# Patient Record
Sex: Male | Born: 1960 | Race: Black or African American | Hispanic: No | Marital: Single | Smoking: Never smoker
Health system: Southern US, Community
[De-identification: ages and names within clinical notes are randomized; demographics above are authoritative.]

## PROBLEM LIST (undated history)

## (undated) DIAGNOSIS — M810 Age-related osteoporosis without current pathological fracture: Secondary | ICD-10-CM

## (undated) DIAGNOSIS — N401 Enlarged prostate with lower urinary tract symptoms: Secondary | ICD-10-CM

## (undated) DIAGNOSIS — E669 Obesity, unspecified: Secondary | ICD-10-CM

## (undated) DIAGNOSIS — M5416 Radiculopathy, lumbar region: Secondary | ICD-10-CM

## (undated) DIAGNOSIS — J45909 Unspecified asthma, uncomplicated: Secondary | ICD-10-CM

## (undated) DIAGNOSIS — N138 Other obstructive and reflux uropathy: Secondary | ICD-10-CM

## (undated) DIAGNOSIS — Z8719 Personal history of other diseases of the digestive system: Secondary | ICD-10-CM

## (undated) DIAGNOSIS — T7840XA Allergy, unspecified, initial encounter: Secondary | ICD-10-CM

## (undated) DIAGNOSIS — M1990 Unspecified osteoarthritis, unspecified site: Secondary | ICD-10-CM

## (undated) HISTORY — PX: APPENDECTOMY: SHX54

## (undated) HISTORY — DX: Unspecified osteoarthritis, unspecified site: M19.90

## (undated) HISTORY — DX: Personal history of other diseases of the digestive system: Z87.19

## (undated) HISTORY — DX: Other obstructive and reflux uropathy: N13.8

## (undated) HISTORY — DX: Radiculopathy, lumbar region: M54.16

## (undated) HISTORY — DX: Unspecified asthma, uncomplicated: J45.909

## (undated) HISTORY — DX: Obesity, unspecified: E66.9

## (undated) HISTORY — DX: Allergy, unspecified, initial encounter: T78.40XA

## (undated) HISTORY — DX: Age-related osteoporosis without current pathological fracture: M81.0

## (undated) HISTORY — DX: Benign prostatic hyperplasia with lower urinary tract symptoms: N40.1

---

## 1997-04-13 ENCOUNTER — Emergency Department (HOSPITAL_COMMUNITY): Admission: EM | Admit: 1997-04-13 | Discharge: 1997-04-13 | Payer: Self-pay | Admitting: Emergency Medicine

## 1997-09-15 ENCOUNTER — Emergency Department (HOSPITAL_COMMUNITY): Admission: EM | Admit: 1997-09-15 | Discharge: 1997-09-15 | Payer: Self-pay | Admitting: Emergency Medicine

## 1997-11-29 ENCOUNTER — Encounter: Payer: Self-pay | Admitting: Emergency Medicine

## 1997-11-29 ENCOUNTER — Emergency Department (HOSPITAL_COMMUNITY): Admission: EM | Admit: 1997-11-29 | Discharge: 1997-11-29 | Payer: Self-pay | Admitting: Emergency Medicine

## 1998-05-18 ENCOUNTER — Emergency Department (HOSPITAL_COMMUNITY): Admission: EM | Admit: 1998-05-18 | Discharge: 1998-05-18 | Payer: Self-pay | Admitting: Emergency Medicine

## 1998-08-20 ENCOUNTER — Emergency Department (HOSPITAL_COMMUNITY): Admission: EM | Admit: 1998-08-20 | Discharge: 1998-08-20 | Payer: Self-pay

## 2000-05-03 ENCOUNTER — Emergency Department (HOSPITAL_COMMUNITY): Admission: EM | Admit: 2000-05-03 | Discharge: 2000-05-03 | Payer: Self-pay | Admitting: Internal Medicine

## 2000-08-28 ENCOUNTER — Encounter: Payer: Self-pay | Admitting: Emergency Medicine

## 2000-08-28 ENCOUNTER — Emergency Department (HOSPITAL_COMMUNITY): Admission: AC | Admit: 2000-08-28 | Discharge: 2000-08-29 | Payer: Self-pay

## 2003-07-23 ENCOUNTER — Emergency Department (HOSPITAL_COMMUNITY): Admission: EM | Admit: 2003-07-23 | Discharge: 2003-07-23 | Payer: Self-pay | Admitting: Family Medicine

## 2003-07-24 ENCOUNTER — Inpatient Hospital Stay (HOSPITAL_COMMUNITY): Admission: EM | Admit: 2003-07-24 | Discharge: 2003-07-28 | Payer: Self-pay | Admitting: Emergency Medicine

## 2003-07-24 ENCOUNTER — Encounter (INDEPENDENT_AMBULATORY_CARE_PROVIDER_SITE_OTHER): Payer: Self-pay | Admitting: *Deleted

## 2003-11-29 ENCOUNTER — Emergency Department (HOSPITAL_COMMUNITY): Admission: EM | Admit: 2003-11-29 | Discharge: 2003-11-29 | Payer: Self-pay | Admitting: Family Medicine

## 2004-09-19 ENCOUNTER — Emergency Department (HOSPITAL_COMMUNITY): Admission: EM | Admit: 2004-09-19 | Discharge: 2004-09-19 | Payer: Self-pay | Admitting: Emergency Medicine

## 2004-09-20 ENCOUNTER — Emergency Department (HOSPITAL_COMMUNITY): Admission: EM | Admit: 2004-09-20 | Discharge: 2004-09-20 | Payer: Self-pay | Admitting: Emergency Medicine

## 2005-08-15 ENCOUNTER — Emergency Department (HOSPITAL_COMMUNITY): Admission: EM | Admit: 2005-08-15 | Discharge: 2005-08-15 | Payer: Self-pay | Admitting: Family Medicine

## 2005-08-25 ENCOUNTER — Emergency Department (HOSPITAL_COMMUNITY): Admission: EM | Admit: 2005-08-25 | Discharge: 2005-08-25 | Payer: Self-pay | Admitting: Emergency Medicine

## 2005-08-26 ENCOUNTER — Emergency Department (HOSPITAL_COMMUNITY): Admission: EM | Admit: 2005-08-26 | Discharge: 2005-08-26 | Payer: Self-pay | Admitting: Emergency Medicine

## 2005-09-02 ENCOUNTER — Emergency Department (HOSPITAL_COMMUNITY): Admission: EM | Admit: 2005-09-02 | Discharge: 2005-09-02 | Payer: Self-pay | Admitting: Family Medicine

## 2005-11-03 ENCOUNTER — Emergency Department (HOSPITAL_COMMUNITY): Admission: EM | Admit: 2005-11-03 | Discharge: 2005-11-03 | Payer: Self-pay | Admitting: Family Medicine

## 2005-12-06 ENCOUNTER — Emergency Department (HOSPITAL_COMMUNITY): Admission: EM | Admit: 2005-12-06 | Discharge: 2005-12-06 | Payer: Self-pay | Admitting: Family Medicine

## 2006-01-23 ENCOUNTER — Emergency Department (HOSPITAL_COMMUNITY): Admission: EM | Admit: 2006-01-23 | Discharge: 2006-01-23 | Payer: Self-pay | Admitting: Family Medicine

## 2006-02-16 ENCOUNTER — Emergency Department (HOSPITAL_COMMUNITY): Admission: EM | Admit: 2006-02-16 | Discharge: 2006-02-16 | Payer: Self-pay | Admitting: Emergency Medicine

## 2006-05-12 ENCOUNTER — Emergency Department (HOSPITAL_COMMUNITY): Admission: EM | Admit: 2006-05-12 | Discharge: 2006-05-12 | Payer: Self-pay | Admitting: Emergency Medicine

## 2006-09-16 ENCOUNTER — Emergency Department (HOSPITAL_COMMUNITY): Admission: EM | Admit: 2006-09-16 | Discharge: 2006-09-16 | Payer: Self-pay | Admitting: Emergency Medicine

## 2008-04-29 ENCOUNTER — Emergency Department (HOSPITAL_COMMUNITY): Admission: EM | Admit: 2008-04-29 | Discharge: 2008-04-29 | Payer: Self-pay | Admitting: Emergency Medicine

## 2008-05-07 ENCOUNTER — Ambulatory Visit: Payer: Self-pay | Admitting: Vascular Surgery

## 2008-05-07 ENCOUNTER — Ambulatory Visit (HOSPITAL_COMMUNITY): Admission: RE | Admit: 2008-05-07 | Discharge: 2008-05-07 | Payer: Self-pay | Admitting: Orthopedic Surgery

## 2008-05-07 ENCOUNTER — Encounter (INDEPENDENT_AMBULATORY_CARE_PROVIDER_SITE_OTHER): Payer: Self-pay | Admitting: Orthopedic Surgery

## 2008-05-27 ENCOUNTER — Emergency Department (HOSPITAL_COMMUNITY): Admission: EM | Admit: 2008-05-27 | Discharge: 2008-05-27 | Payer: Self-pay | Admitting: Emergency Medicine

## 2008-07-08 ENCOUNTER — Emergency Department (HOSPITAL_COMMUNITY): Admission: EM | Admit: 2008-07-08 | Discharge: 2008-07-08 | Payer: Self-pay | Admitting: Family Medicine

## 2008-08-10 ENCOUNTER — Emergency Department (HOSPITAL_COMMUNITY): Admission: EM | Admit: 2008-08-10 | Discharge: 2008-08-10 | Payer: Self-pay | Admitting: Emergency Medicine

## 2008-08-26 ENCOUNTER — Emergency Department (HOSPITAL_COMMUNITY): Admission: EM | Admit: 2008-08-26 | Discharge: 2008-08-26 | Payer: Self-pay | Admitting: Emergency Medicine

## 2009-01-31 ENCOUNTER — Emergency Department (HOSPITAL_COMMUNITY): Admission: EM | Admit: 2009-01-31 | Discharge: 2009-01-31 | Payer: Self-pay | Admitting: Emergency Medicine

## 2010-03-25 ENCOUNTER — Emergency Department (HOSPITAL_COMMUNITY)
Admission: EM | Admit: 2010-03-25 | Discharge: 2010-03-26 | Disposition: A | Discharge status: Home / Self Care | Payer: Self-pay | Attending: Emergency Medicine | Admitting: Emergency Medicine

## 2010-03-25 DIAGNOSIS — M25559 Pain in unspecified hip: Secondary | ICD-10-CM | POA: Insufficient documentation

## 2010-03-26 ENCOUNTER — Emergency Department (HOSPITAL_COMMUNITY): Payer: Self-pay

## 2010-04-15 LAB — GC/CHLAMYDIA PROBE AMP, GENITAL: Chlamydia, DNA Probe: NEGATIVE

## 2010-04-18 LAB — RPR: RPR Ser Ql: NONREACTIVE

## 2010-04-18 LAB — GC/CHLAMYDIA PROBE AMP, GENITAL
Chlamydia, DNA Probe: NEGATIVE
GC Probe Amp, Genital: POSITIVE — AB

## 2010-05-26 NOTE — Discharge Summary (Signed)
NAME:  [name] Pope, [name] Pope NO.:  [id number]   MEDICAL RECORD NO.:  [id number]                   PATIENT TYPE:  INP   LOCATION:  5713                                 FACILITY:  [facility]   PHYSICIAN:  Velora Heckler, M.D.                DATE OF BIRTH:  [birth year]   DATE OF ADMISSION:  07/24/2003  DATE OF DISCHARGE:  07/28/2003                                 DISCHARGE SUMMARY   REASON FOR ADMISSION:  Acute appendicitis with perforation.   BRIEF HISTORY:  The patient is a 50 year old black male who presented to the  emergency department with a 5-day history of abdominal pain localized to the  right lower quadrant.  The patient was seen by Dr. Sheppard Penton. [name].  White  count was 9.3, but with a decided left shift.  CT scan of abdomen and pelvis  showed findings consistent with acute retrocecal appendicitis.   HOSPITAL COURSE:  The patient was seen and evaluated in the emergency  department.  He was prepared and taken to the operating room, where he  underwent laparoscopic appendectomy.  Findings at surgery included acute  appendicitis with perforation.  Postoperatively, he received intravenous  antibiotics.  He was started on a clear liquid diet.  He had gradual  resolution of his ileus.  He was advanced to a regular diet.  Ileus resolved  by the fourth postoperative day and he was prepared for discharge.   DISCHARGE PLANNING:  The patient is discharged home on the fourth  postoperative day.  He is tolerating a regular diet and ambulating  independently.  The patient will be seen back at my office at [facility] in 2 weeks.   DISCHARGE MEDICATIONS:  Discharge medications include:  1. Vicodin as needed for pain.  2. Augmentin 875 mg b.i.d. for 1 week.   FINAL DIAGNOSIS:  Acute appendicitis with perforation.   CONDITION AT DISCHARGE:  Condition at discharge is improved.                                                Velora Heckler, M.D.    TMG/MEDQ  D:  08/31/2003  T:  08/31/2003  Job:  [id number]

## 2010-05-26 NOTE — H&P (Signed)
NAME:  [name] Pope, [name] Pope NO.:  [id number]   MEDICAL RECORD NO.:  [id number]                   PATIENT TYPE:  INP   LOCATION:  5713                                 FACILITY:  [facility]   PHYSICIAN:  Velora Heckler, M.D.                DATE OF BIRTH:  02-12-1960   DATE OF ADMISSION:  07/24/2003  DATE OF DISCHARGE:                                HISTORY & PHYSICAL   REFERRING PHYSICIAN:  Dr. Hilario Quarry, Emergency Department.   CHIEF COMPLAINT:  Abdominal pain.   HISTORY OF PRESENT ILLNESS:  The patient is a 50 year old black male, who  presents to the emergency department with 5-day history of abdominal pain  localizing to the right lower quadrant.  He denies fevers or chills.  He  denies nausea or vomiting.  He has noted irregular bowel movements and loss  of appetite.  The patient was seen by Dr. Mariel Aloe.  Laboratory studies  showed a white blood cell count of 9.3.  However, differential showed 83%  neutrophils.  Urinalysis was benign.  The patient underwent CT scan of the  abdomen and pelvis, which demonstrated the findings consistent with acute  retrocecal appendicitis.  General Surgery is consulted.   PAST MEDICAL HISTORY:  No prior surgery.  The patient denies significant  medical illness.   MEDICATIONS:  None.   ALLERGIES:  None known.   SOCIAL HISTORY:  The patient is accompanied by a girlfriend.  He works as an  Advice worker.  He does not smoke.  He drinks alcohol  socially.  He denies illicit drug use.   FAMILY HISTORY:  Notable for appendectomy and cholecystectomy in the  patient's mother.   REVIEW OF SYSTEMS:  15-system review without significant other positives  except as noted above.   PHYSICAL EXAMINATION:  GENERAL: A 50 year old, ill-appearing, black male on  a stretcher in the emergency department.  VITAL SIGNS:  Temp 99.1, pulse 71, respirations 20, blood pressure 114/64.  HEENT:  Normocephalic,  atraumatic.  Sclerae are clear.  Conjunctivae are  clear.  Pupils are equal and reactive.  Dentition is good.  Mucous membranes  are dry.  NECK:  Supple without mass.  Thyroid is normal without nodularity.  There is  no palpable lymphadenopathy.  LUNGS:  Clear to auscultation bilaterally without rales, rhonchi, or  wheezes.  CARDIAC:  Regular rate and rhythm without significant murmur.  ABDOMEN:  Soft, mildly to moderately distended, without bowel sounds present  on auscultation.  There is mild diffuse tenderness.  Tenderness is maximal  at McBurney's point with rebound tenderness.  There is voluntary guarding.  There is no palpable mass.  GENITOURINARY:  Normal male.  EXTREMITIES:  Nontender without edema.  NEUROLOGIC:  The patient is alert and oriented without focal deficit.   LABORATORY STUDIES:  White count 9.3, hemoglobin 14.0, platelet count  241,000, differential with 83% neutrophils, 10%  lymphocytes.  Chemistry  profile shows normal electrolytes.  Renal function is normal.  Urinalysis is  benign.   RADIOGRAPHIC STUDIES:  CT scan, abdomen and pelvis, with acute retrocecal  appendicitis reviewed in the emergency department.   IMPRESSION:  Acute appendicitis.   PLAN:  1. Admission to [facility].  2. Initiation of intravenous antibiotics.  3. To operating room for appendectomy.  4. Routine postoperative care.   I discussed with the patient and his girlfriend the indications for surgery.  I explained laparoscopic technique versus open surgery.  Given the  retrocecal location of his appendix, he may not be amenable to laparoscopic  technique, although I think that is worthwhile as an initial intervention.  We will proceed to the operating room immediately.  Hospital course was  discussed.  They understand and wish to proceed.                                                Velora Heckler, M.D.    TMG/MEDQ  D:  07/24/2003  T:  07/24/2003  Job:  [id number]   cc:    Hilario Quarry, M.D.  [address]  [place]  [place] [address]  Fax: [phone]

## 2010-05-26 NOTE — Op Note (Signed)
NAME:  [name] Pope, [name] Pope NO.:  [id number]   MEDICAL RECORD NO.:  [id number]                   PATIENT TYPE:  INP   LOCATION:  5713                                 FACILITY:  [facility]   PHYSICIAN:  Velora Heckler, M.D.                DATE OF BIRTH:  June 22, 1960   DATE OF PROCEDURE:  07/24/2003  DATE OF DISCHARGE:                                 OPERATIVE REPORT   PREOPERATIVE DIAGNOSIS:  Acute retrocecal appendicitis.   POSTOPERATIVE DIAGNOSIS:  Retrocecal acute appendicitis with contained  perforation.   PROCEDURE:  Laparoscopic appendectomy.   SURGEON:  Velora Heckler, M.D.   ASSISTANT:  None.   ANESTHESIA:  General per Dr. Kipp Brood.   ESTIMATED BLOOD LOSS:  Minimal.   PREPARATION:  Betadine.   COMPLICATIONS:  None.   INDICATIONS:  The patient is a 50 year old black male who presents with a 5  day history of abdominal pain localizing to the right abdomen.  The patient  notes loss of appetite.  He has had irregular bowel movements.  He was seen  in the emergency department, found to have a slight left shift of his white  blood cell count.  He underwent CT scan of the abdomen which demonstrated  retrocecal acute appendicitis.   Patient was then prepared and brought to the operating room for  appendectomy.   Procedure is done in OR #16 at the [facility].  The  patient is brought to the operating room, placed in the supine position on  the operating room table.  Following administration of general anesthesia,  the patient is prepped and draped in the usual strict aseptic fashion.  After ascertaining that an adequate level of anesthesia had been obtained, a  supraumbilical incision was made in the midline.  Dissection was carried to  the fascia.  The fascia was incised and the peritoneal cavity was entered  cautiously.  A 0 Vicryl pursestring suture was placed in the fascia.  An  Hasson cannula was introduced and secured  with the pursestring suture.  The  abdomen was insufflated with carbon dioxide.  The laparoscope was introduced  and the abdomen was explored.  Operative port is placed in the right upper  quadrant.  Glassman clamp was used to mobilize the cecum.  Appendix appears  relatively normal at its base.  However, it does tract up the right colic  gutter in a retrocecal location.  It is obviously injected and dilated and  indurated.  There is no gross peritonitis.  A second operative port is  placed in the left lower quadrant.  Base of the appendix is carefully  dissected out.  A window is made in the mesoappendix.  A GIA type stapler is  inserted and used to transect the base of the appendix.  Appendiceal  mesentery is then taken down with the harmonic scalpel.  Due to difficulty  mobilizing the cecum and right colon out of the field of view, a fourth  operative port is placed in the left upper quadrant.  A Glassman clamp is  used through this port to retract the cecum and right colon medially.  This  provides adequate visualization to the retrocecal portion of the appendix.  Retroperitoneum is incised with the harmonic scalpel.  Remaining  mesoappendix is divided with the harmonic scalpel.  A small abscess cavity  at the tip of the appendix, in the right colic gutter, is entered and  evacuated.  Remainder of the peritoneal attachments are then incised with  the harmonic scalpel and the appendix is completely resected and placed in  through an endo catch bag.  It is withdrawn through the left lower quadrant  port.  Colic gutter and pericecal area is then irrigated copiously with warm  saline which was evacuated.  Good hemostasis is noted.  All fluid is  evacuated from the peritoneal cavity.  Ports are removed under direct vision  and pneumoperitoneum released.  A 0 Vicryl pursestring suture is tied  securely.  Wounds are anesthetized with local anesthetic.  All wounds were  closed with interrupted  4-0 Vicryl subcuticular sutures.  Wounds are washed  and dried and benzoin and Steri-Strips are applied.  Sterile dressings are  applied.  Appendix is submitted to pathology for review.  The patient  tolerated the procedure well.                                               Velora Heckler, M.D.    TMG/MEDQ  D:  07/24/2003  T:  07/25/2003  Job:  [id number]

## 2010-08-03 IMAGING — CR DG KNEE 1-2V*L*
2 series · 2 of 2 positions shown · non-contrast
Comparison: 09/19/2004.

CLINICAL DATA: Leg pain.

LEFT KNEE - 1-2 VIEW

[view not recorded (1 of 2)]
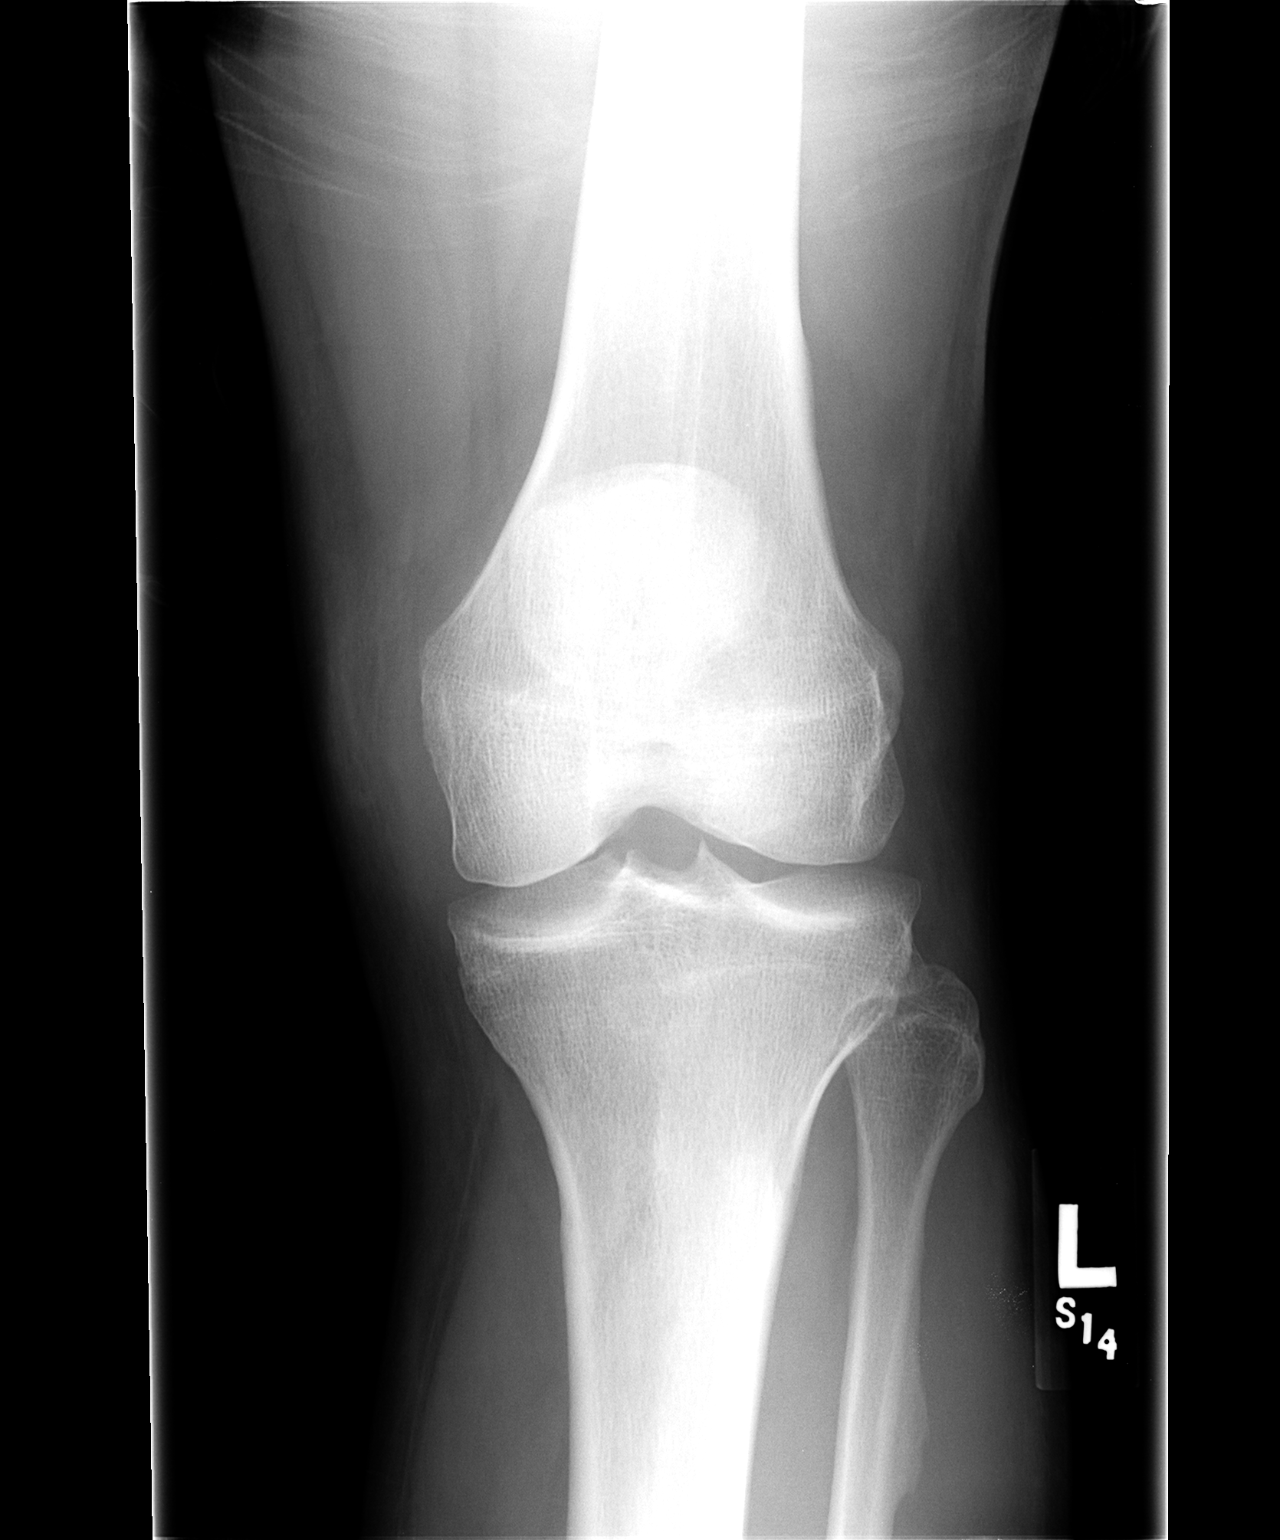

[view not recorded (2 of 2)]
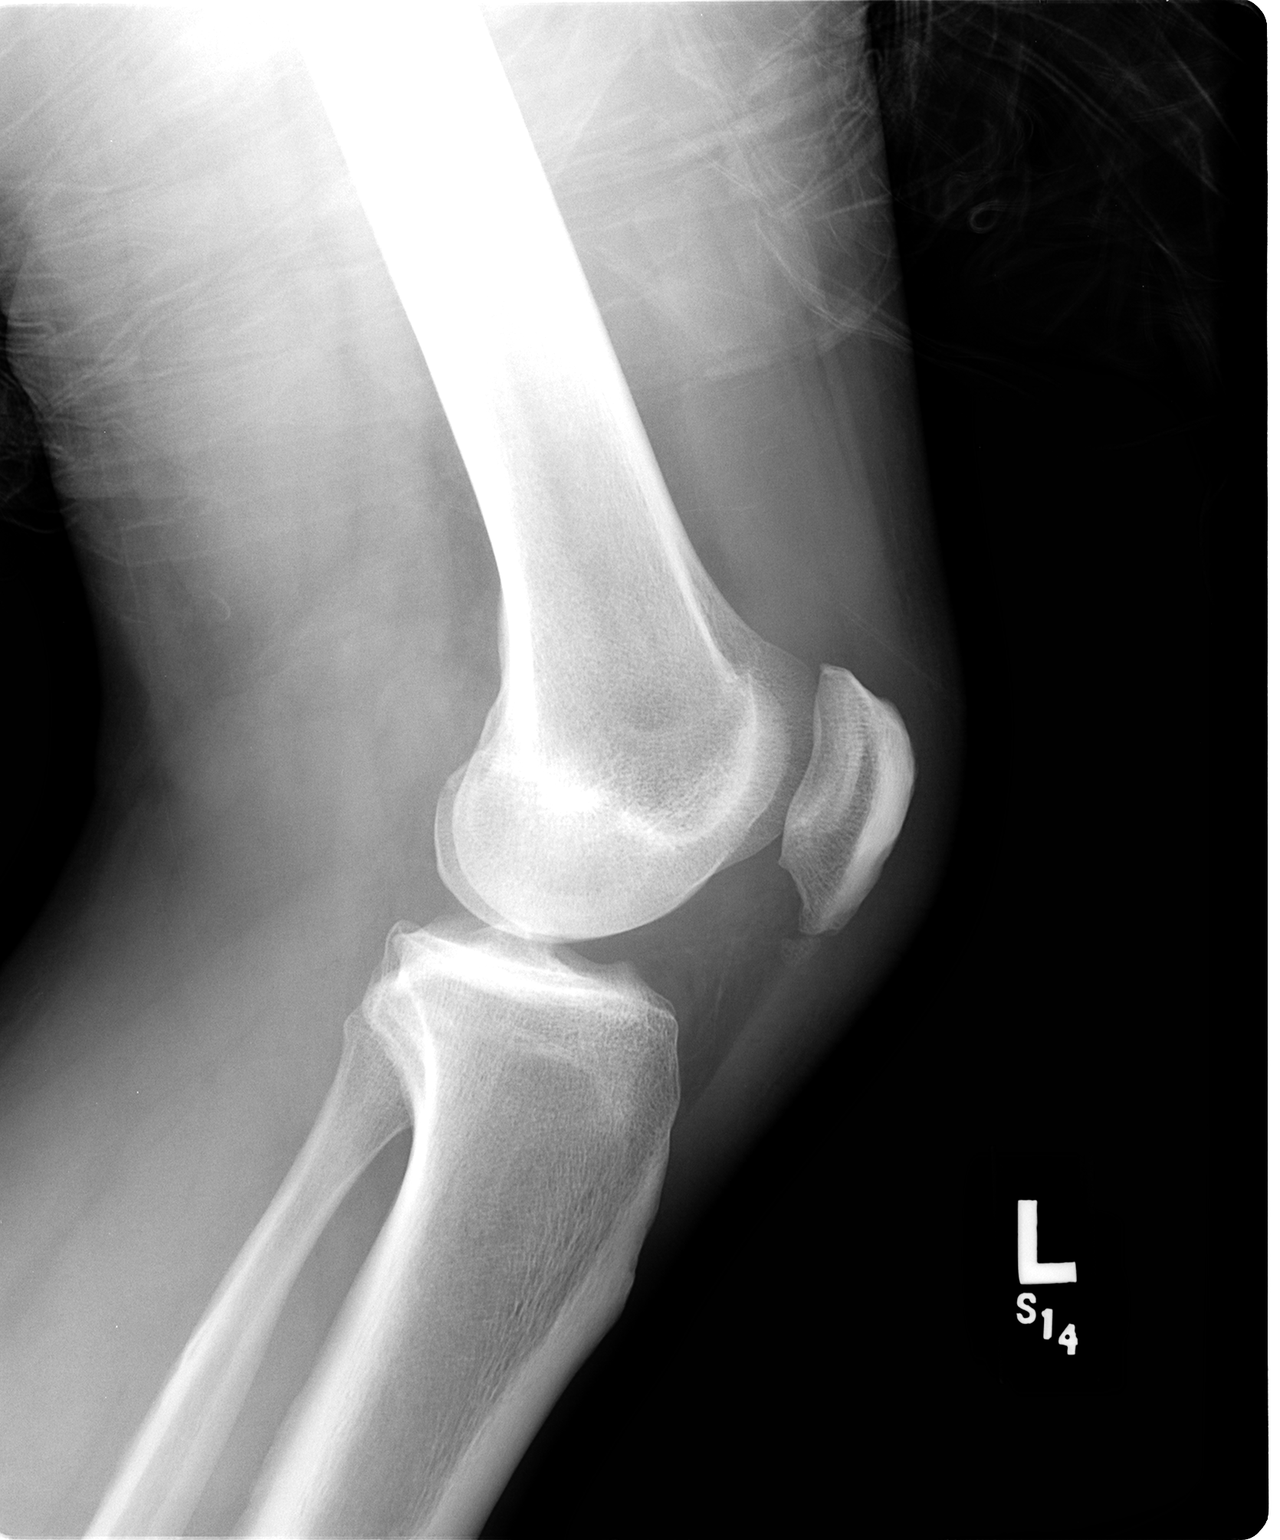

[2 of 2 positions shown; findings below may reference images not displayed]

FINDINGS: There is a large joint effusion as noted on the prior
study.  No fracture is identified.  Joint spaces are well
maintained. Minimal spurring of the patella and tibial spines is
noted.
IMPRESSION: Knee joint effusion.  No acute bony abnormality.

## 2011-03-15 ENCOUNTER — Emergency Department (INDEPENDENT_AMBULATORY_CARE_PROVIDER_SITE_OTHER): Payer: Self-pay

## 2011-03-15 ENCOUNTER — Encounter (HOSPITAL_COMMUNITY): Payer: Self-pay | Admitting: Emergency Medicine

## 2011-03-15 ENCOUNTER — Emergency Department (INDEPENDENT_AMBULATORY_CARE_PROVIDER_SITE_OTHER)
Admission: EM | Admit: 2011-03-15 | Discharge: 2011-03-15 | Disposition: A | Discharge status: Home Health | Payer: Self-pay | Source: Home / Self Care | Attending: Family Medicine | Admitting: Family Medicine

## 2011-03-15 DIAGNOSIS — S329XXA Fracture of unspecified parts of lumbosacral spine and pelvis, initial encounter for closed fracture: Secondary | ICD-10-CM

## 2011-03-15 MED ORDER — ACETAMINOPHEN-CODEINE #3 300-30 MG PO TABS: 1.0000 | ORAL_TABLET | Freq: Four times a day (QID) | ORAL | Status: AC | PRN

## 2011-03-15 NOTE — ED Notes (Addendum)
PT HERE WITH BILAT SEVERE HIP PAIN THAT HAS STARTED X AGO BUT HAS WORSENED WITH MOBILITY AND LIFESTYLE.PT STATES THE PAIN IS SHARP,CONSTANT PAIN THAT AFFECTS HIM GETTING IN/OUT OOB .NEEDS WIFE TO LIFT HIM IN BED.DENIES INJURY OR STRAIN.NO SWELLING.PT WAS SEEN HERE 1 YR AGO WITH LEFT LEG PAIN AND DIAGNOSED WITH ARTHRITIS AND TOLD TO F/U BUT PT DIDN'T.PT ALSO REPORTS OF WALKING UP [place] AND BOTH HIPS GAVE OUT ON HIM.TAKING OTC IBUPROFEN FOR PAIN

## 2011-03-15 NOTE — Discharge Instructions (Signed)
Call ortho for appt. Use crutches as directe.

## 2011-03-15 NOTE — ED Provider Notes (Signed)
History     CSN: [id number]  Arrival date & time 03/15/11  1122   First MD Initiated Contact with Patient 03/15/11 1335      Chief Complaint  Patient presents with  . Hip Pain    (Consider location/radiation/quality/duration/timing/severity/associated sxs/prior treatment) HPI Comments: The patient reports having bilat hip pain for the past 6 months. Has been told he has arthritis in past but did no follow up with a physician. States he cannot get in and out of bed without assistance. States he is unable to drive. No swelling. No injury.taking ibuprofen with very little relief.   The history is provided by the patient.    History reviewed. No pertinent past medical history.  Past Surgical History  Procedure Date  . Appendectomy     History reviewed. No pertinent family history.  History  Substance Use Topics  . Smoking status: Never Smoker   . Smokeless tobacco: Not on file  . Alcohol Use: No      Review of Systems  HENT: Negative.   Respiratory: Negative.   Cardiovascular: Negative.   Gastrointestinal: Negative.   Genitourinary: Negative.   Musculoskeletal: Positive for myalgias, arthralgias and gait problem.  Neurological: Positive for weakness. Negative for numbness.    Allergies  Review of patient's allergies indicates no known allergies.  Home Medications   Current Outpatient Rx  Name Route Sig Dispense Refill  . ACETAMINOPHEN-CODEINE #3 300-30 MG PO TABS Oral Take 1-2 tablets by mouth every 6 (six) hours as needed for pain. 20 tablet 0    BP 119/77  Pulse 70  Temp(Src) 98.6 F (37 C) (Oral)  Resp 18  SpO2 97%  Physical Exam  Nursing note and vitals reviewed. Constitutional: He is oriented to person, place, and time. He appears well-developed.  HENT:  Head: Normocephalic.  Cardiovascular: Regular rhythm.   Pulmonary/Chest: Effort normal and breath sounds normal.  Musculoskeletal:       Painful rom of hips. No apparent swelling    Neurological: He is alert and oriented to person, place, and time.  Skin: Skin is warm and dry.  he is ambulatory  ED Course  Procedures (including critical care time)  Labs Reviewed - No data to display Dg Hip Complete Right  03/15/2011  *RADIOLOGY REPORT*  Clinical Data: Hip pain for 2 months  RIGHT HIP - COMPLETE 2+ VIEW  Comparison: None.  Findings: There is a subacute fracture involving the right pubic body with evidence of callus formation.  Femoral neck is intact. Mild degenerative change of the SI joints.  IMPRESSION: No evidence of acute bony injury of the femoral neck.  Subacute fracture of the right pubic body with callus formation is noted.  Original Report Authenticated By: Donavan Burnet, M.D.     1. Fractured pelvis       MDM          Randa Spike, MD 03/15/11 [id number]

## 2011-04-25 ENCOUNTER — Emergency Department (HOSPITAL_COMMUNITY): Payer: Self-pay

## 2011-04-25 ENCOUNTER — Encounter (HOSPITAL_COMMUNITY): Payer: Self-pay | Admitting: Emergency Medicine

## 2011-04-25 ENCOUNTER — Emergency Department (HOSPITAL_COMMUNITY)
Admission: EM | Admit: 2011-04-25 | Discharge: 2011-04-25 | Disposition: A | Discharge status: Home / Self Care | Payer: Self-pay | Attending: Emergency Medicine | Admitting: Emergency Medicine

## 2011-04-25 DIAGNOSIS — M25559 Pain in unspecified hip: Secondary | ICD-10-CM | POA: Insufficient documentation

## 2011-04-25 DIAGNOSIS — M25551 Pain in right hip: Secondary | ICD-10-CM

## 2011-04-25 MED ORDER — MELOXICAM 7.5 MG PO TABS: 7.5000 mg | ORAL_TABLET | Freq: Every day | ORAL | Status: DC

## 2011-04-25 NOTE — ED Notes (Addendum)
Pt's family/friend at bedside reports pt being seen at [facility] x 3 weeks ago for pelvic pain-+pelvic fx.  Here today for pain.  Ran out of pain med.  Pt is ambulatory with crutches.  Denies any injury associated to the fx

## 2011-04-25 NOTE — ED Notes (Signed)
Pt c/o pelvic pain and was treated at [facility] urgent [facility] x3 weeks ago.  Pain unrelieved with tylenol.

## 2011-04-25 NOTE — Discharge Instructions (Signed)
Your x-ray showed that the fracture to her pelvis appears to be healing. You have been given a prescription for Mobic, which is a medication in the Motrin family, which can work well for arthritis type pain. Please take this, with food, as prescribed. Return to the ER if you develop numbness, weakness, inability to walk, bleeding, or any other worrisome symptoms.  Hip Pain The hips join the upper legs to the lower pelvis. The bones, cartilage, tendons, and muscles of the hip joint perform a lot of work each day holding your body weight and allowing you to move around. Hip pain is a common symptom. It can range from a minor ache to severe pain on 1 or both hips. Pain may be felt on the inside of the hip joint near the groin, or the outside near the buttocks and upper thigh. There may be swelling or stiffness as well. It occurs more often when a person walks or performs activity. There are many reasons hip pain can develop. CAUSES  It is important to work with your caregiver to identify the cause since many conditions can impact the bones, cartilage, muscles, and tendons of the hips. Causes for hip pain include:  Broken (fractured) bones.   Separation of the thighbone from the hip socket (dislocation).   Torn cartilage of the hip joint.   Swelling (inflammation) of a tendon (tendonitis), the sac within the hip joint (bursitis), or a joint.   A weakening in the abdominal wall (hernia), affecting the nerves to the hip.   Arthritis in the hip joint or lining of the hip joint.   Pinched nerves in the back, hip, or upper thigh.   A bulging disc in the spine (herniated disc).   Rarely, bone infection or cancer.  DIAGNOSIS  The location of your hip pain will help your caregiver understand what may be causing the pain. A diagnosis is based on your medical history, your symptoms, results from your physical exam, and results from diagnostic tests. Diagnostic tests may include X-ray exams, a  computerized magnetic scan (magnetic resonance imaging, MRI), or bone scan. TREATMENT  Treatment will depend on the cause of your hip pain. Treatment may include:  Limiting activities and resting until symptoms improve.   Crutches or other walking supports (a cane or brace).   Ice, elevation, and compression.   Physical therapy or home exercises.   Shoe inserts or special shoes.   Losing weight.   Medications to reduce pain.   Undergoing surgery.  HOME CARE INSTRUCTIONS   Only take over-the-counter or prescription medicines for pain, discomfort, or fever as directed by your caregiver.   Put ice on the injured area:   Put ice in a plastic bag.   Place a towel between your skin and the bag.   Leave the ice on for 15 to 20 minutes at a time, 3 to 4 times a day.   Keep your leg raised (elevated) when possible to lessen swelling.   Avoid activities that cause pain.   Follow specific exercises as directed by your caregiver.   Sleep with a pillow between your legs on your most comfortable side.   Record how often you have hip pain, the location of the pain, and what it feels like. This information may be helpful to you and your caregiver.   Ask your caregiver about returning to work or sports and whether you should drive.   Follow up with your caregiver for further exams, therapy, or testing as  directed.  SEEK MEDICAL CARE IF:   Your pain or swelling continues or worsens after 1 week.   You are feeling unwell or have chills.   You have increasing difficulty with walking.   You have a loss of sensation or other new symptoms.   You have questions or concerns.  SEEK IMMEDIATE MEDICAL CARE IF:   You cannot put weight on the affected hip.   You have fallen.   You have a sudden increase in pain and swelling in your hip.   You have a fever.  MAKE SURE YOU:   Understand these instructions.   Will watch your condition.   Will get help right away if you are not  doing well or get worse.  Document Released: 06/14/2009 Document Revised: 12/14/2010 Document Reviewed: 06/14/2009 [facility] Patient Information 2012 [place], [place].

## 2011-04-25 NOTE — ED Provider Notes (Signed)
History     CSN: [id number]  Arrival date & time 04/25/11  1330   First MD Initiated Contact with Patient 04/25/11 1508      Chief Complaint  Patient presents with  . Pelvic Pain    (Consider location/radiation/quality/duration/timing/severity/associated sxs/prior treatment) HPI History from patient. 51 year old male with no pertinent past medical history presents with bilateral hip pain. He was seen for the same 3 weeks ago at [facility] urgent care, where x-rays were performed. He was found to have a healing fracture of his pelvis. He was given prescriptions for Tylenol 3 and instructed to follow up with orthopedics, but states he was unable to do so as he has no insurance. He is not exactly sure when the pain started, but thinks it may have been at least several weeks prior to going to urgent care. It's been persistent since his urgent care visit. Described as throbbing pain to the bilateral hips, worse with any movement. States it is difficult to sleep. He is able to bear weight and walk, although states this is painful. States that the Tylenol 3 has not helped very much.  History reviewed. No pertinent past medical history.  Past Surgical History  Procedure Date  . Appendectomy     History reviewed. No pertinent family history.  History  Substance Use Topics  . Smoking status: Never Smoker   . Smokeless tobacco: Not on file  . Alcohol Use: No      Review of Systems  Constitutional: Negative.  Negative for fever, chills and unexpected weight change.  Gastrointestinal: Negative for nausea, vomiting and abdominal pain.  Musculoskeletal: Positive for arthralgias and gait problem. Negative for myalgias, back pain and joint swelling.  Skin: Negative for color change and rash.  Neurological: Negative for weakness and numbness.    Allergies  Review of patient's allergies indicates no known allergies.  Home Medications  No current outpatient prescriptions on file.  BP  141/90  Pulse 72  Temp(Src) 97.7 F (36.5 C) (Oral)  Resp 20  SpO2 99%  Physical Exam  Nursing note and vitals reviewed. Constitutional: He appears well-developed and well-nourished. No distress.  HENT:  Head: Normocephalic and atraumatic.  Neck: Normal range of motion.  Cardiovascular: Normal rate.   Pulmonary/Chest: Effort normal.  Musculoskeletal: Normal range of motion.       Legs:      Bilateral hips: No visible deformity, edema on inspection. Tender to palpation as diagrammed. No tenderness to palpation of the pelvis; no crepitus appreciated. Pelvis stable. Tender with ROM, worst with external/internal rotation.  Neurological: He is alert.  Skin: Skin is warm and dry. He is not diaphoretic.  Psychiatric: He has a normal mood and affect.    ED Course  Procedures (including critical care time)  Labs Reviewed - No data to display Dg Hip Complete Right  04/25/2011  *RADIOLOGY REPORT*  Clinical Data: Recent occult pelvic fracture, continued bilateral hip pain  RIGHT HIP - COMPLETE 2+ VIEW  Comparison: 03/15/2011  Findings: Mild irregularity of the right pubic symphysis, possibly reflecting a healing nondisplaced fracture.  No additional fractures are seen.  Mild degenerative changes of the bilateral hips with symmetric joint space narrowing.  IMPRESSION: Mild irregularity of the right pubic symphysis, possibly reflecting a healing nondisplaced fracture.  No additional fractures are seen.  Mild degenerative changes of the bilateral hips.  Original Report Authenticated By: Charline Bills, M.D.     1. Bilateral hip pain       MDM  Otherwise healthy pt presents with bilateral hip pain. He was seen in urgent care 3 weeks ago and found to have an occult pelvic fracture. Repeat x-rays were performed today which showed changes likely consistent with healing and degen changes. Will provide patient with a prescription for Mobic. We discussed the importance of following up with  orthopedics for definitive treatment. Patient instructed on gentle stretching exercises, heat, rest. Return precautions discussed.       [place], [place] 04/25/11 1702

## 2011-04-26 NOTE — ED Provider Notes (Signed)
Medical screening examination/treatment/procedure(s) were performed by non-physician practitioner and as supervising physician I was immediately available for consultation/collaboration. Devoria Albe, MD, FACEP   Ward Givens, MD 04/26/11 1051

## 2011-05-09 DIAGNOSIS — Z0271 Encounter for disability determination: Secondary | ICD-10-CM

## 2011-05-25 ENCOUNTER — Emergency Department (HOSPITAL_COMMUNITY)
Admission: EM | Admit: 2011-05-25 | Discharge: 2011-05-25 | Disposition: A | Discharge status: Home / Self Care | Payer: Self-pay | Attending: Emergency Medicine | Admitting: Emergency Medicine

## 2011-05-25 DIAGNOSIS — M533 Sacrococcygeal disorders, not elsewhere classified: Secondary | ICD-10-CM | POA: Insufficient documentation

## 2011-05-25 DIAGNOSIS — IMO0001 Reserved for inherently not codable concepts without codable children: Secondary | ICD-10-CM | POA: Insufficient documentation

## 2011-05-25 DIAGNOSIS — M5416 Radiculopathy, lumbar region: Secondary | ICD-10-CM

## 2011-05-25 DIAGNOSIS — M545 Low back pain, unspecified: Secondary | ICD-10-CM | POA: Insufficient documentation

## 2011-05-25 DIAGNOSIS — M25559 Pain in unspecified hip: Secondary | ICD-10-CM | POA: Insufficient documentation

## 2011-05-25 DIAGNOSIS — R269 Unspecified abnormalities of gait and mobility: Secondary | ICD-10-CM | POA: Insufficient documentation

## 2011-05-25 DIAGNOSIS — IMO0002 Reserved for concepts with insufficient information to code with codable children: Secondary | ICD-10-CM | POA: Insufficient documentation

## 2011-05-25 MED ORDER — PREDNISONE 20 MG PO TABS: 40.0000 mg | ORAL_TABLET | Freq: Every day | ORAL | Status: DC

## 2011-05-25 MED ORDER — HYDROCODONE-ACETAMINOPHEN 5-500 MG PO TABS: 1.0000 | ORAL_TABLET | Freq: Four times a day (QID) | ORAL | Status: AC | PRN

## 2011-05-25 MED ORDER — HYDROMORPHONE HCL PF 2 MG/ML IJ SOLN
2.0000 mg | Freq: Once | INTRAMUSCULAR | Status: DC
  Filled 2011-05-25: qty 1

## 2011-05-25 NOTE — ED Provider Notes (Signed)
History     CSN: [id number]  Arrival date & time 05/25/11  1320   First MD Initiated Contact with Patient 05/25/11 1514      Chief Complaint  Patient presents with  . Hip Pain    (Consider location/radiation/quality/duration/timing/severity/associated sxs/prior treatment) Patient is a 51 y.o. male presenting with hip pain. The history is provided by the patient.  Hip Pain This is a chronic problem. The current episode started more than 1 month ago. The problem occurs constantly. The problem has been gradually worsening. Associated symptoms include arthralgias, myalgias and numbness. Pertinent negatives include no abdominal pain, chills, fever or weakness.  Pt states he had a pelvic fracture about 4 mon ago. States pain since then. Has had multiple visits here, has seen a provider, states waiting on "test results." states pain worsening. Pain in lower back, radiates bilat thighs. Denis fever, abd pain, urinary incontinence, retention, loss of bowels. Denies IV drug use.   No past medical history on file.  Past Surgical History  Procedure Date  . Appendectomy     No family history on file.  History  Substance Use Topics  . Smoking status: Never Smoker   . Smokeless tobacco: Not on file  . Alcohol Use: No      Review of Systems  Constitutional: Negative for fever and chills.  Respiratory: Negative.   Cardiovascular: Negative.   Gastrointestinal: Negative for abdominal pain.  Genitourinary: Negative for difficulty urinating.  Musculoskeletal: Positive for myalgias, back pain, arthralgias and gait problem.  Skin: Negative.   Neurological: Positive for numbness. Negative for weakness.    Allergies  Review of patient's allergies indicates no known allergies.  Home Medications   Current Outpatient Rx  Name Route Sig Dispense Refill  . ACETAMINOPHEN 500 MG PO TABS Oral Take 1,000 mg by mouth 2 (two) times daily as needed. pain    . IBUPROFEN 200 MG PO TABS Oral Take  800 mg by mouth every 6 (six) hours as needed. pain      BP 130/87  Pulse 73  Temp(Src) 98 F (36.7 C) (Oral)  Resp 16  SpO2 97%  Physical Exam  Nursing note and vitals reviewed. Constitutional: He is oriented to person, place, and time. He appears well-developed and well-nourished. No distress.  HENT:  Head: Normocephalic.  Eyes: Conjunctivae are normal.  Neck: Neck supple.  Cardiovascular: Normal rate, regular rhythm and normal heart sounds.   Pulmonary/Chest: Effort normal and breath sounds normal.  Abdominal: Soft. Bowel sounds are normal. He exhibits no distension. There is no tenderness.  Musculoskeletal:       No midline lumbar spine tenderness. Tender in bilateral SI joints and down bilateral buttocks. Pain with bilateral straight leg raise. Normal patellar reflexes bilaterally, 2+. Equal strength, 5/5 in LE bilat. Pt able to dorsiflex bilateral feet and great toes. Normal sensation in thighs.   Neurological: He is alert and oriented to person, place, and time. He displays normal reflexes. He exhibits normal muscle tone.  Skin: Skin is warm and dry.  Psychiatric: He has a normal mood and affect.    ED Course  Procedures (including critical care time)  This is chronic pain from previous injury. Pt has been seen for the same several times here. He has no red flags on exam to suggest cord compression. I do think his pain is radicular, from lumbar spine. Will d/c home with pain management and follow up with his doctor.   3:41 PM  I have seen and d/c  pt home, however, pt no where to be found. He did not receive his prescriptions or dose of pain med in ED.   1. Lumbar radiculopathy, chronic       MDM          Lottie Mussel, PA 05/26/11 [phone]

## 2011-05-25 NOTE — ED Notes (Signed)
Pt c/o bilateral hip pain. States had pelvic fx 4 months ago. Unable to see Ortho due to no insurance.

## 2011-05-25 NOTE — Discharge Instructions (Signed)
Avoid strenuous physical activity. Take vicodin as prescribed as needed for severe pain. Prednisone for inflammation. Follow up with your doctor.   Lumbosacral Radiculopathy Lumbosacral radiculopathy is a pinched nerve or nerves in the low back (lumbosacral area). When this happens you may have weakness in your legs and may not be able to stand on your toes. You may have pain going down into your legs. There may be difficulties with walking normally. There are many causes of this problem. Sometimes this may happen from an injury, or simply from arthritis or boney problems. It may also be caused by other illnesses such as diabetes. If there is no improvement after treatment, further studies may be done to find the exact cause. DIAGNOSIS  X-rays may be needed if the problems become long standing. Electromyograms may be done. This study is one in which the working of nerves and muscles is studied. HOME CARE INSTRUCTIONS   Applications of ice packs may be helpful. Ice can be used in a plastic bag with a towel around it to prevent frostbite to skin. This may be used every 2 hours for 20 to 30 minutes, or as needed, while awake, or as directed by your caregiver.   Only take over-the-counter or prescription medicines for pain, discomfort, or fever as directed by your caregiver.   If physical therapy was prescribed, follow your caregiver's directions.  SEEK IMMEDIATE MEDICAL CARE IF:   You have pain not controlled with medications.   You seem to be getting worse rather than better.   You develop increasing weakness in your legs.   You develop loss of bowel or bladder control.   You have difficulty with walking or balance, or develop clumsiness in the use of your legs.   You have a fever.  MAKE SURE YOU:   Understand these instructions.   Will watch your condition.   Will get help right away if you are not doing well or get worse.  Document Released: 12/25/2004 Document Revised: 12/14/2010  Document Reviewed: 08/15/2007 [facility] Patient Information 2012 [place], [place].

## 2011-05-25 NOTE — ED Notes (Signed)
Pt not in room and has not returned.

## 2011-05-25 NOTE — ED Notes (Signed)
Pt left before receiving medications.

## 2011-05-28 NOTE — ED Provider Notes (Signed)
Medical screening examination/treatment/procedure(s) were performed by non-physician practitioner and as supervising physician I was immediately available for consultation/collaboration.   Suzi Roots, MD 05/28/11 [phone]

## 2011-09-11 ENCOUNTER — Ambulatory Visit (INDEPENDENT_AMBULATORY_CARE_PROVIDER_SITE_OTHER): Payer: Self-pay | Admitting: Family Medicine

## 2011-09-11 ENCOUNTER — Encounter: Payer: Self-pay | Admitting: Family Medicine

## 2011-09-11 VITALS — BP 120/74 | HR 70 | Temp 97.7°F | Resp 16

## 2011-09-11 DIAGNOSIS — M1610 Unilateral primary osteoarthritis, unspecified hip: Secondary | ICD-10-CM

## 2011-09-11 DIAGNOSIS — M25559 Pain in unspecified hip: Secondary | ICD-10-CM

## 2011-09-11 MED ORDER — MELOXICAM 7.5 MG PO TABS: 7.5000 mg | ORAL_TABLET | Freq: Every day | ORAL | Status: AC

## 2011-09-11 NOTE — Progress Notes (Signed)
51 yo homeless man with hip pain bilaterally, migrating back and forth.  This has been ongoing for years.  No specific injury He played basketball, baseball and football in high school  Objective:  NAD Patient waddling gait evident Hip ROM  Decreased internal and external rotation, painful flexion Mild tenderness pubic ramus  Review of previous films:  Early bilateral arthritis.  Assessment:  Hip arthritis, chronic, worsening  Plan: Lawyer:  Laverle Hobby  [phone], fax [phone] 1. Arthritis pain of hip  meloxicam (MOBIC) 7.5 MG tablet   Do ROM exercises

## 2011-10-11 ENCOUNTER — Emergency Department (HOSPITAL_COMMUNITY)
Admission: EM | Admit: 2011-10-11 | Discharge: 2011-10-11 | Disposition: A | Discharge status: Home / Self Care | Payer: Self-pay | Source: Home / Self Care | Attending: Emergency Medicine | Admitting: Emergency Medicine

## 2011-10-15 ENCOUNTER — Encounter (HOSPITAL_COMMUNITY): Payer: Self-pay | Admitting: *Deleted

## 2011-10-15 ENCOUNTER — Emergency Department (HOSPITAL_COMMUNITY)
Admission: EM | Admit: 2011-10-15 | Discharge: 2011-10-15 | Disposition: A | Discharge status: Home / Self Care | Payer: Self-pay | Attending: Emergency Medicine | Admitting: Emergency Medicine

## 2011-10-15 DIAGNOSIS — G8929 Other chronic pain: Secondary | ICD-10-CM

## 2011-10-15 DIAGNOSIS — M1610 Unilateral primary osteoarthritis, unspecified hip: Secondary | ICD-10-CM

## 2011-10-15 MED ORDER — ACETAMINOPHEN-CODEINE #3 300-30 MG PO TABS: 1.0000 | ORAL_TABLET | Freq: Four times a day (QID) | ORAL | Status: DC | PRN

## 2011-10-15 MED ORDER — IBUPROFEN 800 MG PO TABS: 800.0000 mg | ORAL_TABLET | Freq: Three times a day (TID) | ORAL | Status: DC

## 2011-10-15 MED ORDER — IBUPROFEN 400 MG PO TABS
600.0000 mg | ORAL_TABLET | Freq: Once | ORAL | Status: AC
  Administered 2011-10-15: 600 mg via ORAL
  Filled 2011-10-15: qty 1

## 2011-10-15 MED ORDER — KETOROLAC TROMETHAMINE 30 MG/ML IJ SOLN
30.0000 mg | Freq: Once | INTRAMUSCULAR | Status: AC
  Administered 2011-10-15: 30 mg via INTRAMUSCULAR
  Filled 2011-10-15: qty 1

## 2011-10-15 NOTE — Discharge Instructions (Signed)
Chronic Pain Discharge Instructions  Emergency care providers appreciate that many patients coming to [place] are in severe pain and we wish to address their pain in the safest, most responsible manner.  It is important to recognize however, that the proper treatment of chronic pain differs from that of the pain of injuries and acute illnesses.  Our goal is to provide quality, safe, personalized care and we thank you for giving [place] the opportunity to serve you. The use of narcotics and related agents for chronic pain syndromes may lead to additional physical and psychological problems.  Nearly as many people die from prescription narcotics each year as die from car crashes.  Additionally, this risk is increased if such prescriptions are obtained from a variety of sources.  Therefore, only your primary care physician or a pain management specialist is able to safely treat such syndromes with narcotic medications long-term.    Documentation revealing such prescriptions have been sought from multiple sources may prohibit [place] from providing a refill or different narcotic medication.  Your name may be checked first through the [facility] Controlled Substances Reporting System.  This database is a record of controlled substance medication prescriptions that the patient has received.  This has been established by [facility] in an effort to eliminate the dangerous, and often life threatening, practice of obtaining multiple prescriptions from different medical providers.   If you have a chronic pain syndrome (i.e. chronic headaches, recurrent back or neck pain, dental pain, abdominal or pelvis pain without a specific diagnosis, or neuropathic pain such as fibromyalgia) or recurrent visits for the same condition without an acute diagnosis, you may be treated with non-narcotics and other non-addictive medicines.  Allergic reactions or negative side effects that may be reported by a patient to such medications will not  typically lead to the use of a narcotic analgesic or other controlled substance as an alternative.   Patients managing chronic pain with a personal physician should have provisions in place for breakthrough pain.  If you are in crisis, you should call your physician.  If your physician directs you to the emergency department, please have the doctor call and speak to our attending physician concerning your care.   When patients come to the Emergency Department (ED) with acute medical conditions in which the Emergency Department physician feels appropriate to prescribe narcotic or sedating pain medication, the physician will prescribe these in very limited quantities.  The amount of these medications will last only until you can see your primary care physician in his/her office.  Any patient who returns to the ED seeking refills should expect only non-narcotic pain medications.   In the event of an acute medical condition exists and the emergency physician feels it is necessary that the patient be given a narcotic or sedating medication -  a responsible adult driver should be present in the room prior to the medication being given by the nurse.   Prescriptions for narcotic or sedating medications that have been lost, stolen or expired will not be refilled in the Emergency Department.    Patients who have chronic pain may receive non-narcotic prescriptions until seen by their primary care physician.  It is every patient's personal responsibility to maintain active prescriptions with his or her primary care physician or specialist.  RESOURCE GUIDE  Chronic Pain Problems: Contact [name]  [phone] Patients need to be referred by their primary care doctor.  Insufficient Money for Medicine: Contact United Way:  call "  211" or [facility] [employer] [phone].  No Primary Care Doctor: - Call Health Connect  [phone] - can help you locate a primary care doctor that  accepts your  insurance, provides certain services, etc. - Physician Referral Service- [phone]  Agencies that provide inexpensive medical care: - [facility]  [phone] - [facility]  [phone] - [facility]  [phone] - Women's Clinic  [phone] - Planned Parenthood  [phone] [facility]  [phone]  Medicaid-accepting [facility] Providers: - [facility]- [address]  [phone], Mon-Fri 9am-7pm, Sat 9am-1pm - [facility]- [address] [place], [address] [place]  [phone] - [facility]- [address], [address] [place]  [phone] [facility]- [address]  [phone] - Renaye Rakers- [address] [place], [address], [phone]  Only accepts [place] patients after they have their name  applied to their card  Self Pay (no insurance) in [place]: - Sickle Cell Patients: Dr Willey Blade, [facility]  [address] [place], [phone] - [facility]- [address] [place]  [phone]       [facility] [place]- [address]       -     [facility]- see information above (Speak to [employer] if you do not have insurance)       -  [facility]- [address] [place], [phone]       -  [facility]- [address],  [phone]       -  [facility]- [address] [phone]       -  Dr Julio Sicks-  [address], [place], [phone]       -  [facility]- [address] [phone]       -  [facility]- [address] [phone], also [address] [phone]       -    [facility]- [address] [place], [phone], 1st & 3rd Saturday   every month, 10am-1pm  1) Find a Doctor and Pay Out of Pocket Although you won't have to find out who is covered by your insurance plan, it is a good idea to ask  around and get recommendations. You will then need to call the office and see if the doctor you have chosen will accept you as a new patient and what types of options they offer for patients who are self-pay. Some doctors offer discounts or will set up payment plans for their patients who do not have insurance, but you will need to ask so you aren't surprised when you get to your appointment.  2) Contact Your Local Health Department Not all health departments have doctors that can see patients for sick visits, but many do, so it is worth a call to see if yours does. If you don't know where your local health department is, you can check in your phone book. The CDC also has a tool to help you locate your state's health department, and many state websites also have listings of all of their local health departments.  3) Find a Walk-in Clinic If your illness is not likely to be very severe or complicated, you may want to try a walk  in clinic. These are popping up all over the country in pharmacies, drugstores, and shopping centers. They're usually staffed by nurse practitioners or physician assistants that have been trained to treat common illnesses and complaints. They're usually fairly quick and inexpensive. However, if you have serious medical issues or chronic medical problems, these are probably not your best option  STD Testing - [facility] Department of [facility] [place], STD Clinic, [address] [place], phone [phone] or [phone].  Monday - Friday, call for an appointment. [facility] Department of [employer], STD Clinic, [address], [place], phone [phone] or [phone].  Monday - Friday, call for an appointment.  Abuse/Neglect: [facility] Child Abuse Hotline 778-183-0078 [facility] Child Abuse Hotline (937) 163-1210 (After Hours)  Emergency Shelter:  [facility] 276-159-9879  Maternity Homes: - Room at  the [facility] 3201746336 - [facility] ([phone]  MRSA Hotline #:   (970)597-5590  [facility] Resources  [facility] [place]  [employer] [facility] Dept. [address]                 [address]         [address] [place] [address]  Blondell Reveal Phone:  [phone]                                  Phone:  [phone]                   Phone:  [phone]  [facility] Mental Health, [phone] - [phone]       -     [facility] in [place], [address],                                  [phone], [facility] Child Abuse Hotline 773-056-7942 or 204-435-2834 (After Hours)   Behavioral Health Services  Substance Abuse Resources: - Alcohol and Drug Services  850-532-2671 - [facility] (534)504-1338 - [facility] (640) [phone] Floydene Flock [phone] - Residential & Outpatient Substance Abuse Program  [phone]  Psychological Services: [facility]  [phone] Services  [phone] - [facility], [phone] [place]. [address], [place], ACCESS LINE: [phone] or [phone], [employer]  Dental Assistance  If unable to pay or uninsured, contact:  [facility] or [facility]. to become qualified for the adult dental clinic.  Patients with Medicaid: [facility] [phone] [address], [phone] [address] [phone]  If unable to pay, or uninsured, contact [facility] [phone]) or [facility] Department [phone] in [place], [phone] in [place]) to become qualified for the adult dental clinic  Other Low-Cost Community Dental Services: -  [facility]- [address] [place], [place], [address], [phone], [address]. [address], 2nd and 4th Thursday of the month at 6:30am.  10 clients each day by appointment, can sometimes see walk-in patients if someone does not show for an appointment. [facility]- [address] [place], [place], [address], [phone] - [facility]- [address] [place], [place], [address], [phone] - [facility]- [phone] [facility] Health Department- [phone] [facility] Department- [phone]

## 2011-10-15 NOTE — ED Notes (Signed)
The pt has had bi-lateral hip pain for months .he saw a doctor that diagnosed him with arthritis.  He needs pain med

## 2011-10-15 NOTE — ED Provider Notes (Signed)
History   This chart was scribed for [name] Pope. [name], MD by [name]. The patient was seen in room TR06C/TR06C. Patient's care was started at 6:55PM.   CSN: [id number]  Arrival date & time 10/15/11  1705   None     Chief Complaint  Patient presents with  . Leg Pain     The history is provided by the patient. No language interpreter was used.   [name] is a 51 y.o. male who presents to the Emergency Department complaining of severe chronic hip pain attributed to arthritis that has been worsening for the past few months. Pain is worse with walking and he reports taking arthritis medication with out improvement. He denies any recent injuries or falls. He denies having a PCP currently or being on medicaid or medicare saying that he has applied but has been rejected. He denies fever, chills, nausea, or emesis. Pt denies smoking and alcohol use. Pt has not been approached by staff at [facility] regarding help with insurance.  Pt has a disability lawyer assigned to him who told him to seek another doctor's opinion regarding disability but pt has not been able to.    History reviewed. No pertinent past medical history. Patient is currently unemployeed. Past Surgical History  Procedure Date  . Appendectomy     History reviewed. No pertinent family history.  History  Substance Use Topics  . Smoking status: Never Smoker   . Smokeless tobacco: Not on file  . Alcohol Use: No      Review of Systems  Constitutional: Negative for fever and chills.  Gastrointestinal: Negative for nausea and vomiting.  Musculoskeletal: Negative for back pain.       Positive for bilateral hip pain that is chronic in nature.     Allergies  Review of patient's allergies indicates no known allergies.  Home Medications   Current Outpatient Rx  Name Route Sig Dispense Refill  . IBUPROFEN 200 MG PO TABS Oral Take 1,000 mg by mouth every 6 (six) hours as needed. pain    . MELOXICAM 7.5 MG PO TABS  Oral Take 1 tablet (7.5 mg total) by mouth daily. 90 tablet 0  . PREDNISONE 20 MG PO TABS Oral Take 2 tablets (40 mg total) by mouth daily. 10 tablet 0  . ACETAMINOPHEN 500 MG PO TABS Oral Take 1,000 mg by mouth 2 (two) times daily as needed. pain    . ACETAMINOPHEN-CODEINE #3 300-30 MG PO TABS Oral Take 1-2 tablets by mouth every 6 (six) hours as needed for pain. 15 tablet 0  . IBUPROFEN 800 MG PO TABS Oral Take 1 tablet (800 mg total) by mouth 3 (three) times daily. 21 tablet 0    Triage Vitals: BP 122/77  Pulse 63  Temp 98.5 F (36.9 C) (Oral)  Resp 16  SpO2 98%  Physical Exam  Nursing note and vitals reviewed. Constitutional: He is oriented to person, place, and time. He appears well-developed and well-nourished. No distress.  HENT:  Head: Normocephalic and atraumatic.  Eyes: EOM are normal.  Neck: Neck supple. No tracheal deviation present.  Cardiovascular: Normal rate.   Pulmonary/Chest: Effort normal. No respiratory distress.  Musculoskeletal:       Pain with ROM of both hips, normal strength in all extremities.  Neurological: He is alert and oriented to person, place, and time. He has normal strength. No sensory deficit. He exhibits normal muscle tone.  Skin: Skin is warm and dry. No rash noted.  Psychiatric: He  has a normal mood and affect. His behavior is normal.    ED Course  Procedures (including critical care time) DIAGNOSTIC STUDIES: Oxygen Saturation is 98% on room air, normal by my interpretation.    COORDINATION OF CARE: 7:05PM- Patient informed of clinical course, understand medical decision-making process, and agree with plan.    Labs Reviewed - No data to display No results found.   1. Chronic pain   2. Hip arthritis       MDM  I personally performed the services described in this documentation, which was scribed in my presence. The recorded information has been reviewed and considered.    I reviewed pt's multiple prior notes, similar  presentation.  Has limited ROM, no fever, doubt septic joint.  No new trauma.  Pt has intact gross sensation distally and good strength distally.        [name] Pope. Antonius Hartlage, MD 10/21/11 1725

## 2011-10-21 ENCOUNTER — Encounter (HOSPITAL_COMMUNITY): Payer: Self-pay | Admitting: Emergency Medicine

## 2011-10-30 ENCOUNTER — Ambulatory Visit (INDEPENDENT_AMBULATORY_CARE_PROVIDER_SITE_OTHER): Payer: Self-pay | Admitting: Family Medicine

## 2011-10-30 ENCOUNTER — Encounter: Payer: Self-pay | Admitting: Family Medicine

## 2011-10-30 VITALS — BP 130/78 | HR 63 | Temp 98.3°F | Resp 18 | Ht 69.0 in | Wt 190.0 lb

## 2011-10-30 DIAGNOSIS — M169 Osteoarthritis of hip, unspecified: Secondary | ICD-10-CM

## 2011-10-30 NOTE — Progress Notes (Signed)
51 yo with at least 6 years of hip pain which has progressed.  He finds the soreness particularly painful on rainy and cold days.    The meds seemed to help for a couple days, then the pain seemed as bad as ever.  He last worked 14 years ago.  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  Objective:  Alert, cooperative Hands:  No PIP or DIP swelling, nails bitten, full hand and wrist ROM; Elbow and wrist ROM is full Neck:  ROM normal Knee ROM and inspection:  Normal Hip:  Cannot flex knees to chest (lacks 45 degrees each side, though left is more painful);  Cannot cross legs  Gait: slow and antalgic    Assessment:  Patient seems disabled in terms of hip pain.  I've suggested he reapply for Medicaid.  At this point, there is not much more that I can do until we can get orthopedics and physical therapy involved through Medicaid coverage.
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
## 2011-11-19 ENCOUNTER — Ambulatory Visit (INDEPENDENT_AMBULATORY_CARE_PROVIDER_SITE_OTHER): Payer: Self-pay | Admitting: Family Medicine

## 2011-11-19 ENCOUNTER — Encounter: Payer: Self-pay | Admitting: Family Medicine

## 2011-11-19 VITALS — BP 127/77 | HR 77 | Temp 97.0°F | Resp 16 | Ht 69.5 in | Wt 195.0 lb

## 2011-11-19 DIAGNOSIS — M25559 Pain in unspecified hip: Secondary | ICD-10-CM

## 2011-11-19 DIAGNOSIS — G8929 Other chronic pain: Secondary | ICD-10-CM | POA: Insufficient documentation

## 2011-11-19 MED ORDER — METHYLPREDNISOLONE ACETATE 80 MG/ML IJ SUSP
80.0000 mg | Freq: Once | INTRAMUSCULAR | Status: DC
  Administered 2011-11-19: 80 mg via INTRAMUSCULAR
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
## 2011-11-20 ENCOUNTER — Encounter: Payer: Self-pay | Admitting: Family Medicine

## 2011-11-20 ENCOUNTER — Ambulatory Visit: Payer: Self-pay

## 2011-11-20 ENCOUNTER — Ambulatory Visit (INDEPENDENT_AMBULATORY_CARE_PROVIDER_SITE_OTHER): Payer: Self-pay | Admitting: Family Medicine

## 2011-11-20 VITALS — BP 118/82 | HR 62 | Temp 97.5°F | Resp 18 | Ht 68.75 in | Wt 193.0 lb

## 2011-11-20 DIAGNOSIS — M25559 Pain in unspecified hip: Secondary | ICD-10-CM

## 2011-11-20 LAB — POCT SEDIMENTATION RATE: POCT SED RATE: 28 mm/hr — AB (ref 0–22)

## 2011-11-20 NOTE — Patient Instructions (Addendum)
Osteoarthritis Osteoarthritis is the most common form of arthritis. It is redness, soreness, and swelling (inflammation) affecting the cartilage. Cartilage acts as a cushion, covering the ends of bones where they meet to form a joint. CAUSES  Over time, the cartilage begins to wear away. This causes bone to rub on bone. This produces pain and stiffness in the affected joints. Factors that contribute to this problem are:  Excessive body weight.  Age.  Overuse of joints. SYMPTOMS   People with osteoarthritis usually experience joint pain, swelling, or stiffness.  Over time, the joint may lose its normal shape.  Small deposits of bone (osteophytes) may grow on the edges of the joint.  Bits of bone or cartilage can break off and float inside the joint space. This may cause more pain and damage.  Osteoarthritis can lead to depression, anxiety, feelings of helplessness, and limitations on daily activities. The most commonly affected joints are in the:  Ends of the fingers.  Thumbs.  Neck.  Lower back.  Knees.  Hips. DIAGNOSIS  Diagnosis is mostly based on your symptoms and exam. Tests may be helpful, including:  X-rays of the affected joint.  A computerized magnetic scan (MRI).  Blood tests to rule out other types of arthritis.  Joint fluid tests. This involves using a needle to draw fluid from the joint and examining the fluid under a microscope. TREATMENT  Goals of treatment are to control pain, improve joint function, maintain a normal body weight, and maintain a healthy lifestyle. Treatment approaches may include:  A prescribed exercise program with rest and joint relief.  Weight control with nutritional education.  Pain relief techniques such as:  Properly applied heat and cold.  Electric pulses delivered to nerve endings under the skin (transcutaneous electrical nerve stimulation, TENS).  Massage.  Certain supplements. Ask your caregiver before using any  supplements, especially in combination with prescribed drugs.  Medicines to control pain, such as:  Acetaminophen.  Nonsteroidal anti-inflammatory drugs (NSAIDs), such as naproxen.  Narcotic or central-acting agents, such as tramadol. This drug carries a risk of addiction and is generally prescribed for short-term use.  Corticosteroids. These can be given orally or as injection. This is a short-term treatment, not recommended for routine use.  Surgery to reposition the bones and relieve pain (osteotomy) or to remove loose pieces of bone and cartilage. Joint replacement may be needed in advanced states of osteoarthritis. HOME CARE INSTRUCTIONS  Your caregiver can recommend specific types of exercise. These may include:  Strengthening exercises. These are done to strengthen the muscles that support joints affected by arthritis. They can be performed with weights or with exercise bands to add resistance.  Aerobic activities. These are exercises, such as brisk walking or low-impact aerobics, that get your heart pumping. They can help keep your lungs and circulatory system in shape.  Range-of-motion activities. These keep your joints limber.  Balance and agility exercises. These help you maintain daily living skills. Learning about your condition and being actively involved in your care will help improve the course of your osteoarthritis. SEEK MEDICAL CARE IF:   You feel hot or your skin turns red.  You develop a rash in addition to your joint pain.  You have an oral temperature above 102 F (38.9 C). FOR MORE INFORMATION  National Institute of Arthritis and Musculoskeletal and Skin Diseases: www.niams.nih.gov National Institute on Aging: www.nia.nih.gov American College of Rheumatology: www.rheumatology.org Document Released: 12/25/2004 Document Revised: 03/19/2011 Document Reviewed: 04/07/2009 ExitCare Patient Information 2013 [employer].  
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
## 2011-11-21 ENCOUNTER — Telehealth: Payer: Self-pay

## 2011-11-21 LAB — COMPREHENSIVE METABOLIC PANEL
ALT: 23 U/L (ref 0–53)
AST: 20 U/L (ref 0–37)
Albumin: 4.3 g/dL (ref 3.5–5.2)
Alkaline Phosphatase: 55 U/L (ref 39–117)
BUN: 16 mg/dL (ref 6–23)
CO2: 25 mEq/L (ref 19–32)
Calcium: 9.4 mg/dL (ref 8.4–10.5)
Chloride: 102 mEq/L (ref 96–112)
Creat: 0.91 mg/dL (ref 0.50–1.35)
Glucose, Bld: 96 mg/dL (ref 70–99)
Potassium: 4.5 mEq/L (ref 3.5–5.3)
Sodium: 138 mEq/L (ref 135–145)
Total Bilirubin: 0.4 mg/dL (ref 0.3–1.2)
Total Protein: 7.4 g/dL (ref 6.0–8.3)

## 2011-11-21 LAB — PSA: PSA: 1.28 ng/mL (ref <=4.00)

## 2011-11-21 NOTE — Telephone Encounter (Signed)
Labs show a low grade arthritis.  He does have significant ongoing arthritis on the x-rays, though, and he really should see an orthopedist

## 2011-11-21 NOTE — Telephone Encounter (Signed)
Dr Milus Glazier, please review pt's labs and comment so that we can call pt back.

## 2011-11-21 NOTE — Telephone Encounter (Signed)
Patient wanted to leave another phone number for [place] to call for lab results.  [phone]

## 2011-11-22 NOTE — Telephone Encounter (Signed)
Left message for him to call back, so I can advise and see if he wants to see Ortho.

## 2011-11-23 NOTE — Telephone Encounter (Signed)
Patient states he has not seen an orthopedist before, he will discuss this when he comes in next month to see Dr Milus Glazier.

## 2011-12-20 ENCOUNTER — Ambulatory Visit (INDEPENDENT_AMBULATORY_CARE_PROVIDER_SITE_OTHER): Payer: Self-pay | Admitting: Family Medicine

## 2011-12-20 ENCOUNTER — Encounter: Payer: Self-pay | Admitting: Family Medicine

## 2011-12-20 VITALS — BP 114/77 | HR 69 | Temp 97.8°F | Resp 16 | Ht 68.75 in | Wt 191.0 lb

## 2011-12-20 DIAGNOSIS — M25559 Pain in unspecified hip: Secondary | ICD-10-CM

## 2011-12-20 DIAGNOSIS — M1990 Unspecified osteoarthritis, unspecified site: Secondary | ICD-10-CM
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
## 2012-01-09 DIAGNOSIS — Z0271 Encounter for disability determination: Secondary | ICD-10-CM

## 2012-01-31 ENCOUNTER — Ambulatory Visit (INDEPENDENT_AMBULATORY_CARE_PROVIDER_SITE_OTHER): Payer: Self-pay | Admitting: Family Medicine

## 2012-01-31 VITALS — BP 124/72 | HR 70 | Temp 98.0°F | Resp 16 | Ht 69.0 in | Wt 200.0 lb

## 2012-01-31 DIAGNOSIS — M25551 Pain in right hip: Secondary | ICD-10-CM

## 2012-01-31 DIAGNOSIS — M159 Polyosteoarthritis, unspecified: Secondary | ICD-10-CM

## 2012-01-31 NOTE — Progress Notes (Signed)
52 yo disabled man with chronic right hip pain.  Using a cane now and pain continues to keep him awake.    Objective:  Appears discouraged. Very limited ROM of right hip, somewhat limited flexion for left hip Tender left trochanter  Assessment:  Needs disability.  We cannot go forward until he has health insurance.  Plan:  Trial of tramadol Continue to lobby for disability

## 2012-03-19 ENCOUNTER — Encounter: Payer: Self-pay | Admitting: Family Medicine

## 2012-03-19 ENCOUNTER — Ambulatory Visit (INDEPENDENT_AMBULATORY_CARE_PROVIDER_SITE_OTHER): Payer: Self-pay | Admitting: Family Medicine

## 2012-03-19 VITALS — BP 116/86 | HR 65 | Temp 98.0°F | Resp 16 | Ht 70.5 in | Wt 201.4 lb

## 2012-03-19 DIAGNOSIS — M1610 Unilateral primary osteoarthritis, unspecified hip: Secondary | ICD-10-CM

## 2012-03-19 MED ORDER — METHYLPREDNISOLONE ACETATE 80 MG/ML IJ SUSP
80.0000 mg | Freq: Once | INTRAMUSCULAR | Status: AC
  Administered 2012-03-19: 80 mg via INTRAMUSCULAR
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
## 2012-04-08 ENCOUNTER — Telehealth: Payer: Self-pay

## 2012-04-08 NOTE — Telephone Encounter (Signed)
It is the prescriber's responsibility to prescribe the medication.  

## 2012-04-08 NOTE — Telephone Encounter (Signed)
That is as strong as I can order.  He will need to see specialist and I realize that he has no insurance and this is not possible currently.  I am sorry, but tramadol is all I can order

## 2012-04-08 NOTE — Telephone Encounter (Signed)
Okay to refill tramadol x 1 

## 2012-04-08 NOTE — Telephone Encounter (Signed)
Pt requesting a stronger med than his tramadol   Best phone [phone]   Pharmacy [facility]

## 2012-04-08 NOTE — Telephone Encounter (Signed)
Called him to advise. He wants refill on Tramadol please advise.

## 2012-04-09 ENCOUNTER — Other Ambulatory Visit: Payer: Self-pay | Admitting: Family Medicine

## 2012-04-09 DIAGNOSIS — M25551 Pain in right hip: Secondary | ICD-10-CM

## 2012-04-09 MED ORDER — TRAMADOL HCL 50 MG PO TABS: 50.0000 mg | ORAL_TABLET | Freq: Three times a day (TID) | ORAL | Status: DC | PRN

## 2012-04-12 ENCOUNTER — Other Ambulatory Visit: Payer: Self-pay | Admitting: Family Medicine

## 2012-05-12 ENCOUNTER — Encounter: Payer: Self-pay | Admitting: Family Medicine

## 2012-05-12 ENCOUNTER — Ambulatory Visit (INDEPENDENT_AMBULATORY_CARE_PROVIDER_SITE_OTHER): Payer: Self-pay | Admitting: Family Medicine

## 2012-05-12 VITALS — BP 118/79 | HR 81 | Temp 98.8°F | Resp 16 | Ht 69.0 in | Wt 196.0 lb

## 2012-05-12 DIAGNOSIS — G894 Chronic pain syndrome: Secondary | ICD-10-CM

## 2012-05-12 DIAGNOSIS — M25551 Pain in right hip: Secondary | ICD-10-CM

## 2012-05-12 MED ORDER — GABAPENTIN 300 MG PO CAPS: ORAL_CAPSULE | ORAL | Status: DC

## 2012-05-12 NOTE — Progress Notes (Signed)
52 yo man, living in hotel room with girlfriend, having chronic pain.  Now, the pain is in hips and thighs.  Quality of pain is ache with sharp needle like pain.  Objective:  Appears to be in pain Lower extremities:  No edema, decreased hip ROM secondary to pain Abdomen:  Soft, no HSM or masses felt  Assessment:  No significant change in osteoarthritis.  Plan:  Handicapped placard  Note for court written Chronic pain syndrome - Plan: gabapentin (NEURONTIN) 300 MG capsule  Hip pain, bilateral - Plan: gabapentin (NEURONTIN) 300 MG capsule    Signed,  [name], MD

## 2012-06-16 ENCOUNTER — Ambulatory Visit (INDEPENDENT_AMBULATORY_CARE_PROVIDER_SITE_OTHER): Payer: Self-pay | Admitting: Family Medicine

## 2012-06-16 VITALS — BP 123/77 | HR 73 | Temp 97.5°F | Resp 16 | Ht 69.0 in | Wt 197.4 lb

## 2012-06-16 DIAGNOSIS — M25552 Pain in left hip: Secondary | ICD-10-CM

## 2012-06-16 DIAGNOSIS — M25559 Pain in unspecified hip: Secondary | ICD-10-CM

## 2012-06-16 DIAGNOSIS — W57XXXA Bitten or stung by nonvenomous insect and other nonvenomous arthropods, initial encounter: Secondary | ICD-10-CM

## 2012-06-16 DIAGNOSIS — M25551 Pain in right hip: Secondary | ICD-10-CM

## 2012-06-16 DIAGNOSIS — T148 Other injury of unspecified body region: Secondary | ICD-10-CM

## 2012-06-16 MED ORDER — METHYLPREDNISOLONE ACETATE 80 MG/ML IJ SUSP
80.0000 mg | Freq: Once | INTRAMUSCULAR | Status: AC
  Administered 2012-06-16: 80 mg via INTRAMUSCULAR

## 2012-06-16 NOTE — Progress Notes (Signed)
  Subjective:    Patient ID: [name] Pope, male    DOB: 1960/04/18, 52 y.o.   MRN: [id number]  HPI  52 YO male patient comes in today with the finding of a tick bite he saw 7 days ago but thought it was a mole around his midline testicle. He noticed it was causing a little pain and pulled it off about 3 hours ago. He complains of sweats and a rash that itches. The rash is mostly on his upper body.   Bilateral hip pain is starting to act up again with the weather change.    Review of Systems No fevers, sweats, headache,   unusual rash Objective:   Physical Exam  Small 1/2 cm left scrotal induration without tenderness. No other rash on torso Moving slowly and walks with marked antalgic gait Onychomycotic nails on feet with scaly chronic eczematous changes 1 cm soft sebaceous cyst appearing Lesion on occiput     Assessment & Plan:  Chronic hip pain ,recent tick bite of no significance at this point Hip pain, bilateral - Plan: methylPREDNISolone acetate (DEPO-MEDROL) injection 80 mg  [name], MD.

## 2012-06-25 ENCOUNTER — Telehealth: Payer: Self-pay

## 2012-06-25 NOTE — Telephone Encounter (Signed)
Patient's wife states that the patient would like to speak with Dr Milus Glazier regarding a Water quality scientist.  She would not give anymore information for the phone message.   Best#: [phone]  Room#: 249

## 2012-06-26 ENCOUNTER — Telehealth: Payer: Self-pay | Admitting: Family Medicine

## 2012-06-26 NOTE — Telephone Encounter (Signed)
Called him back, to get more information. Patient is filing for disability. I advised patient wife with disability claims there is a records request made and we send records, this is all that is required once someone files. Wife indicates Dr Milus Glazier wants to discuss with patient, and had requested a phone call.

## 2012-06-26 NOTE — Telephone Encounter (Signed)
Still waiting on disability decision

## 2012-08-13 ENCOUNTER — Ambulatory Visit (INDEPENDENT_AMBULATORY_CARE_PROVIDER_SITE_OTHER): Payer: Self-pay | Admitting: Family Medicine

## 2012-08-13 VITALS — BP 110/78 | HR 74 | Temp 97.4°F | Resp 16 | Ht 69.0 in | Wt 202.0 lb

## 2012-08-13 DIAGNOSIS — M25559 Pain in unspecified hip: Secondary | ICD-10-CM

## 2012-08-13 DIAGNOSIS — M25552 Pain in left hip: Secondary | ICD-10-CM

## 2012-08-13 DIAGNOSIS — K921 Melena: Secondary | ICD-10-CM

## 2012-08-13 NOTE — Progress Notes (Signed)
52 year old man with chronic hip pain. He comes in one to get established at he only has Medicaid access. I explained that we don't take this insurance.  Objective: Patient has an unstable gait as he staggers around the room with the help of his friend.  Assessment: Chronic arthritis in hips  Plan: I agreed to set up a MRI of his hips. Blood in stool - Plan: Ambulatory referral to Gastroenterology  Hip pain, bilateral - Plan: MR Hip Left Wo Contrast, MR Hip Right Wo Contrast  Signed, [name], MD

## 2012-08-20 ENCOUNTER — Telehealth: Payer: Self-pay | Admitting: *Deleted

## 2012-08-20 NOTE — Telephone Encounter (Signed)
lmom for pt to cb.--mri canceled for now.    Dr. Milus Glazier MRI was denied pt had medicaid so I called to see why it was denied and they stated after reviewing our clinical notes that we sent them that we did not meet the review of the musculoskeletal guidelines because we did not have recent xr of pt hips.  What would you like for [place] to advise pt to do?

## 2012-08-20 NOTE — Telephone Encounter (Signed)
ERROR

## 2012-08-20 NOTE — Telephone Encounter (Signed)
Patient had x-rays in November of 2013.  If that is not recent enough, please order right and left hip x-rays at [facility]

## 2012-08-21 ENCOUNTER — Ambulatory Visit (HOSPITAL_COMMUNITY): Payer: Medicaid Other

## 2012-08-21 ENCOUNTER — Ambulatory Visit (HOSPITAL_COMMUNITY): Admission: RE | Admit: 2012-08-21 | Payer: Medicaid Other | Source: Ambulatory Visit

## 2012-08-21 NOTE — Telephone Encounter (Signed)
Called patient to advise  °

## 2012-08-21 NOTE — Telephone Encounter (Signed)
Unable to reach him at numbers in system. Letter sent.

## 2012-09-01 ENCOUNTER — Ambulatory Visit: Payer: Self-pay | Admitting: Family Medicine

## 2012-09-26 NOTE — H&P (Signed)
  [facility] [address]   [address] [place], [place] [address] [phone] A Division of [facility]  Loreta Ave, M.D.   Robert A. Thurston Hole, M.D.   Burnell Blanks, M.D.   Eulas Post, M.D.   Lunette Stands, M.D Jewel Baize. Eulah Pont, M.D.  Buford Dresser, M.D.  Charlsie Quest, M.D.  Estell Harpin, M.D.   Melina Fiddler, M.D. Danford Bad. Willa Rough, PA-C  Kirstin A. Shepperson, PA-C  Josh Nezperce, PA-C [place], [place]   RE: [name] Pope, [name] Pope                                [id number]      DOB: [birth year] PROGRESS NOTE: 09-17-12 SUBJECTIVE: This is a 53 year-old [place] American male who presents to the clinic today with bilateral hip pain that he reports started three years ago and is progressively worsening.  He rates the pain at most 9/10 on the pain scale.  Pain is constant.  Exacerbated with walking.  He states that he has difficulty putting shoes and socks on.  Any time he flexes his hips or rotates his hips the pain is markedly worsened.  He has not seen a physician about this in the past.  He denies any significant past medical history.   Past surgical history: Negative.   Allergies: Penicillin. Review of systems: As per HPI, otherwise negative.  See paper chart for further history, examination, past medical history, past surgical history, family history and social history.    OBJECTIVE: Awake, alert and oriented 52 year-old [place] American male in no acute distress.  Height: 5?9.  Weight: 207 pounds.  Blood pressure: 118/81.  Pulse: 71.  Examination of bilateral hips reveal markedly limited internal and external rotation of his hips secondary to stiffness and pain.  He has pain with forward flexion as well.  Examination of his knees are unremarkable.  He is neurovascularly intact in his bilateral lower extremities.  No tenderness over the sciatic notch.  No significant lumbar back tenderness.  No limitation of  flexion or extension in his lower back.  Neurovascularly intact bilateral lower extremities.    X-RAYS: AP and lateral lumbosacral spine films were done in the office today which were negative for DJD, osteoarthritis or fracture.  Bilateral pelvic views were obtained in the office today which show significant degenerative joint disease of his bilateral hips.    ASSESSMENT: Severe DJD of bilateral hips.    PLAN: Lengthy discussion today with him regarding results of the x-rays of his hip and his diagnosis of degenerative joint disease of his hips.  Advised him that due to the severity of his condition the most likely remedy and solution to his problem is total hip replacement for both hips.  He was given a handout to read, as well as recommendation to follow up with Dr. Eulah Pont next week.  He will call with any questions.    Estell Harpin, M.D.   Electronically verified by Estell Harpin, M.D. JSK(TM):jjh D 09-17-12 T 09-17-12

## 2012-09-29 ENCOUNTER — Encounter: Payer: Self-pay | Admitting: Internal Medicine

## 2012-10-09 ENCOUNTER — Encounter (HOSPITAL_COMMUNITY): Payer: Self-pay | Admitting: Respiratory Therapy

## 2012-10-14 ENCOUNTER — Encounter (HOSPITAL_COMMUNITY)
Admission: RE | Admit: 2012-10-14 | Discharge: 2012-10-14 | Disposition: A | Discharge status: Home / Self Care | Payer: Medicaid Other | Source: Ambulatory Visit | Attending: Orthopedic Surgery | Admitting: Orthopedic Surgery

## 2012-10-14 ENCOUNTER — Encounter (HOSPITAL_COMMUNITY): Payer: Self-pay

## 2012-10-14 DIAGNOSIS — Z01818 Encounter for other preprocedural examination: Secondary | ICD-10-CM | POA: Insufficient documentation

## 2012-10-14 DIAGNOSIS — Z01812 Encounter for preprocedural laboratory examination: Secondary | ICD-10-CM | POA: Insufficient documentation

## 2012-10-14 LAB — ABO/RH: ABO/RH(D): A POS

## 2012-10-14 LAB — CBC
HCT: 39.9 % (ref 39.0–52.0)
Hemoglobin: 14.1 g/dL (ref 13.0–17.0)
MCH: 31.1 pg (ref 26.0–34.0)
MCHC: 35.3 g/dL (ref 30.0–36.0)
MCV: 88.1 fL (ref 78.0–100.0)
Platelets: 232 K/uL (ref 150–400)
RBC: 4.53 MIL/uL (ref 4.22–5.81)
RDW: 12.9 % (ref 11.5–15.5)
WBC: 4.3 K/uL (ref 4.0–10.5)

## 2012-10-14 LAB — COMPREHENSIVE METABOLIC PANEL WITH GFR
ALT: 18 U/L (ref 0–53)
AST: 18 U/L (ref 0–37)
Albumin: 3.7 g/dL (ref 3.5–5.2)
Alkaline Phosphatase: 54 U/L (ref 39–117)
BUN: 10 mg/dL (ref 6–23)
CO2: 27 meq/L (ref 19–32)
Calcium: 9 mg/dL (ref 8.4–10.5)
Chloride: 101 meq/L (ref 96–112)
Creatinine, Ser: 0.9 mg/dL (ref 0.50–1.35)
GFR calc Af Amer: >90 mL/min
GFR calc non Af Amer: >90 mL/min
Glucose, Bld: 105 mg/dL — ABNORMAL HIGH (ref 70–99)
Potassium: 3.8 meq/L (ref 3.5–5.1)
Sodium: 137 meq/L (ref 135–145)
Total Bilirubin: 0.3 mg/dL (ref 0.3–1.2)
Total Protein: 7.8 g/dL (ref 6.0–8.3)

## 2012-10-14 LAB — APTT: aPTT: 27 s (ref 24–37)

## 2012-10-14 LAB — TYPE AND SCREEN
ABO/RH(D): A POS
Antibody Screen: NEGATIVE

## 2012-10-14 LAB — URINALYSIS, ROUTINE W REFLEX MICROSCOPIC
Glucose, UA: NEGATIVE mg/dL
Hgb urine dipstick: NEGATIVE
Specific Gravity, Urine: 1.024 (ref 1.005–1.030)

## 2012-10-14 LAB — SURGICAL PCR SCREEN
MRSA, PCR: NEGATIVE
Staphylococcus aureus: NEGATIVE

## 2012-10-14 NOTE — Progress Notes (Signed)
Patient denied having any heart issues, but informed Nurse that he has asthma but has not used inhaler in over 2 years. Patient denied having any acute shortness of breath. PCP is Dr. Milus Glazier. Encounters in [facility].

## 2012-10-14 NOTE — Pre-Procedure Instructions (Signed)
[name] Pope  10/14/2012   Your procedure is scheduled on:  Wednesday October 22, 2012.  Report to [facility] Short Stay Entrance "A"-Admitting at 6:30 AM.  Call this number if you have problems the morning of surgery: [phone]   Remember:   Do not eat food or drink liquids after midnight.   Take these medicines the morning of surgery with A SIP OF WATER: None   Do not wear jewelry.  Do not wear lotions, powders, or colognes. You may wear deodorant.  Men may shave face and neck.  Do not bring valuables to the hospital.  [facility] is not responsible for any belongings or valuables.               Contacts, dentures or bridgework may not be worn into surgery.  Leave suitcase in the car. After surgery it may be brought to your room.  For patients admitted to the hospital, discharge time is determined by your treatment team.               Patients discharged the day of surgery will not be allowed to drive home.  Name and phone number of your driver: Family/Friend  Special Instructions: Shower using CHG 2 nights before surgery and the night before surgery.  If you shower the day of surgery use CHG.  Use special wash - you have one bottle of CHG for all showers.  You should use approximately 1/3 of the bottle for each shower.   Please read over the following fact sheets that you were given: Pain Booklet, Coughing and Deep Breathing, Blood Transfusion Information, Total Joint Packet, MRSA Information and Surgical Site Infection Prevention

## 2012-10-15 ENCOUNTER — Other Ambulatory Visit: Payer: Self-pay | Admitting: Orthopedic Surgery

## 2012-10-21 MED ORDER — CLINDAMYCIN PHOSPHATE 900 MG/50ML IV SOLN
900.0000 mg | INTRAVENOUS | Status: AC
  Administered 2012-10-22: 900 mg via INTRAVENOUS
  Filled 2012-10-21: qty 50

## 2012-10-22 ENCOUNTER — Encounter (HOSPITAL_COMMUNITY)
Admission: RE | Disposition: A | Discharge status: Home Health | Payer: Self-pay | Source: Ambulatory Visit | Attending: Orthopedic Surgery

## 2012-10-22 ENCOUNTER — Inpatient Hospital Stay (HOSPITAL_COMMUNITY): Payer: Medicaid Other

## 2012-10-22 ENCOUNTER — Encounter (HOSPITAL_COMMUNITY): Payer: Self-pay | Admitting: Anesthesiology

## 2012-10-22 ENCOUNTER — Encounter (HOSPITAL_COMMUNITY): Payer: Medicaid Other | Admitting: Anesthesiology

## 2012-10-22 ENCOUNTER — Inpatient Hospital Stay (HOSPITAL_COMMUNITY)
Admission: RE | Admit: 2012-10-22 | Discharge: 2012-10-24 | DRG: 470 | Disposition: A | Discharge status: Home Health | Payer: Medicaid Other | Source: Ambulatory Visit | Attending: Orthopedic Surgery | Admitting: Orthopedic Surgery

## 2012-10-22 ENCOUNTER — Inpatient Hospital Stay (HOSPITAL_COMMUNITY): Payer: Medicaid Other | Admitting: Anesthesiology

## 2012-10-22 DIAGNOSIS — Z7982 Long term (current) use of aspirin: Secondary | ICD-10-CM

## 2012-10-22 DIAGNOSIS — Z23 Encounter for immunization: Secondary | ICD-10-CM

## 2012-10-22 DIAGNOSIS — M8700 Idiopathic aseptic necrosis of unspecified bone: Secondary | ICD-10-CM | POA: Diagnosis present

## 2012-10-22 DIAGNOSIS — M1610 Unilateral primary osteoarthritis, unspecified hip: Principal | ICD-10-CM | POA: Diagnosis present

## 2012-10-22 DIAGNOSIS — M810 Age-related osteoporosis without current pathological fracture: Secondary | ICD-10-CM | POA: Diagnosis present

## 2012-10-22 DIAGNOSIS — M169 Osteoarthritis of hip, unspecified: Principal | ICD-10-CM | POA: Diagnosis present

## 2012-10-22 DIAGNOSIS — J45909 Unspecified asthma, uncomplicated: Secondary | ICD-10-CM | POA: Diagnosis present

## 2012-10-22 DIAGNOSIS — Z79899 Other long term (current) drug therapy: Secondary | ICD-10-CM

## 2012-10-22 HISTORY — PX: TOTAL HIP ARTHROPLASTY: SHX124

## 2012-10-22 SURGERY — ARTHROPLASTY, HIP, TOTAL, ANTERIOR APPROACH
Anesthesia: General | Site: Hip | Laterality: Right | Wound class: Clean

## 2012-10-22 MED ORDER — VECURONIUM BROMIDE 10 MG IV SOLR
INTRAVENOUS | Status: DC | PRN
  Administered 2012-10-22 (×2): 2 mg via INTRAVENOUS

## 2012-10-22 MED ORDER — ROCURONIUM BROMIDE 100 MG/10ML IV SOLN
INTRAVENOUS | Status: DC | PRN
  Administered 2012-10-22: 50 mg via INTRAVENOUS

## 2012-10-22 MED ORDER — TRANEXAMIC ACID 100 MG/ML IV SOLN
1000.0000 mg | INTRAVENOUS | Status: AC
  Administered 2012-10-22: 1000 mg via INTRAVENOUS
  Filled 2012-10-22: qty 10

## 2012-10-22 MED ORDER — KETOROLAC TROMETHAMINE 30 MG/ML IJ SOLN
INTRAMUSCULAR | Status: AC
  Filled 2012-10-22: qty 1

## 2012-10-22 MED ORDER — INFLUENZA VAC SPLIT QUAD 0.5 ML IM SUSP
0.5000 mL | Freq: Once | INTRAMUSCULAR | Status: AC
  Administered 2012-10-24: 0.5 mL via INTRAMUSCULAR
  Filled 2012-10-22: qty 0.5

## 2012-10-22 MED ORDER — ACETAMINOPHEN 650 MG RE SUPP: 650.0000 mg | Freq: Four times a day (QID) | RECTAL | Status: DC | PRN

## 2012-10-22 MED ORDER — ZOLPIDEM TARTRATE 5 MG PO TABS: 5.0000 mg | ORAL_TABLET | Freq: Every evening | ORAL | Status: DC | PRN

## 2012-10-22 MED ORDER — ONDANSETRON HCL 4 MG/2ML IJ SOLN
INTRAMUSCULAR | Status: AC
  Filled 2012-10-22: qty 2

## 2012-10-22 MED ORDER — METHOCARBAMOL 500 MG PO TABS
500.0000 mg | ORAL_TABLET | Freq: Four times a day (QID) | ORAL | Status: DC | PRN
  Administered 2012-10-23 – 2012-10-24 (×3): 500 mg via ORAL
  Filled 2012-10-22 (×3): qty 1

## 2012-10-22 MED ORDER — DEXAMETHASONE 6 MG PO TABS
10.0000 mg | ORAL_TABLET | Freq: Three times a day (TID) | ORAL | Status: AC
  Administered 2012-10-22 – 2012-10-23 (×2): 10 mg via ORAL
  Filled 2012-10-22 (×4): qty 1

## 2012-10-22 MED ORDER — ONDANSETRON HCL 4 MG PO TABS: 4.0000 mg | ORAL_TABLET | Freq: Four times a day (QID) | ORAL | Status: DC | PRN

## 2012-10-22 MED ORDER — ONDANSETRON HCL 4 MG/2ML IJ SOLN
4.0000 mg | Freq: Once | INTRAMUSCULAR | Status: AC | PRN
  Administered 2012-10-22: 4 mg via INTRAVENOUS

## 2012-10-22 MED ORDER — METOCLOPRAMIDE HCL 5 MG/ML IJ SOLN: 5.0000 mg | Freq: Three times a day (TID) | INTRAMUSCULAR | Status: DC | PRN

## 2012-10-22 MED ORDER — 0.9 % SODIUM CHLORIDE (POUR BTL) OPTIME
TOPICAL | Status: DC | PRN
  Administered 2012-10-22: 1000 mL

## 2012-10-22 MED ORDER — KETOROLAC TROMETHAMINE 30 MG/ML IJ SOLN
15.0000 mg | Freq: Once | INTRAMUSCULAR | Status: AC | PRN
  Administered 2012-10-22: 30 mg via INTRAVENOUS

## 2012-10-22 MED ORDER — ONDANSETRON HCL 4 MG/2ML IJ SOLN
4.0000 mg | Freq: Four times a day (QID) | INTRAMUSCULAR | Status: DC | PRN
  Administered 2012-10-22: 4 mg via INTRAVENOUS
  Filled 2012-10-22: qty 2

## 2012-10-22 MED ORDER — MORPHINE SULFATE 2 MG/ML IJ SOLN
2.0000 mg | INTRAMUSCULAR | Status: DC | PRN
  Administered 2012-10-23 (×2): 2 mg via INTRAVENOUS
  Filled 2012-10-22 (×2): qty 1

## 2012-10-22 MED ORDER — PNEUMOCOCCAL VAC POLYVALENT 25 MCG/0.5ML IJ INJ
0.5000 mL | INJECTION | Freq: Once | INTRAMUSCULAR | Status: AC
  Administered 2012-10-24: 0.5 mL via INTRAMUSCULAR
  Filled 2012-10-22: qty 0.5

## 2012-10-22 MED ORDER — ASPIRIN EC 325 MG PO TBEC
325.0000 mg | DELAYED_RELEASE_TABLET | Freq: Every day | ORAL | Status: DC
  Administered 2012-10-23 – 2012-10-24 (×2): 325 mg via ORAL
  Filled 2012-10-22 (×3): qty 1

## 2012-10-22 MED ORDER — LACTATED RINGERS IV SOLN
INTRAVENOUS | Status: DC
  Administered 2012-10-22: 19:00:00 via INTRAVENOUS

## 2012-10-22 MED ORDER — METOCLOPRAMIDE HCL 10 MG PO TABS: 5.0000 mg | ORAL_TABLET | Freq: Three times a day (TID) | ORAL | Status: DC | PRN

## 2012-10-22 MED ORDER — PHENOL 1.4 % MT LIQD: 1.0000 | OROMUCOSAL | Status: DC | PRN

## 2012-10-22 MED ORDER — GLYCOPYRROLATE 0.2 MG/ML IJ SOLN
INTRAMUSCULAR | Status: DC | PRN
  Administered 2012-10-22: 0.6 mg via INTRAVENOUS

## 2012-10-22 MED ORDER — HYDROMORPHONE HCL PF 1 MG/ML IJ SOLN
0.2500 mg | INTRAMUSCULAR | Status: DC | PRN
  Administered 2012-10-22 (×4): 0.5 mg via INTRAVENOUS

## 2012-10-22 MED ORDER — PROPOFOL 10 MG/ML IV BOLUS
INTRAVENOUS | Status: DC | PRN
  Administered 2012-10-22: 180 mg via INTRAVENOUS

## 2012-10-22 MED ORDER — OXYCODONE HCL 5 MG PO TABS
5.0000 mg | ORAL_TABLET | ORAL | Status: DC | PRN
  Administered 2012-10-23: 5 mg via ORAL
  Administered 2012-10-23 – 2012-10-24 (×4): 10 mg via ORAL
  Filled 2012-10-22: qty 2
  Filled 2012-10-22: qty 1
  Filled 2012-10-22 (×3): qty 2

## 2012-10-22 MED ORDER — NEOSTIGMINE METHYLSULFATE 1 MG/ML IJ SOLN
INTRAMUSCULAR | Status: DC | PRN
  Administered 2012-10-22: 4 mg via INTRAVENOUS

## 2012-10-22 MED ORDER — PNEUMOCOCCAL VAC POLYVALENT 25 MCG/0.5ML IJ INJ: 0.5000 mL | INJECTION | INTRAMUSCULAR | Status: DC

## 2012-10-22 MED ORDER — METHOCARBAMOL 100 MG/ML IJ SOLN
500.0000 mg | Freq: Four times a day (QID) | INTRAVENOUS | Status: DC | PRN
  Filled 2012-10-22: qty 5

## 2012-10-22 MED ORDER — HYDROMORPHONE HCL PF 1 MG/ML IJ SOLN
INTRAMUSCULAR | Status: AC
  Filled 2012-10-22: qty 2

## 2012-10-22 MED ORDER — ONDANSETRON HCL 4 MG/2ML IJ SOLN
INTRAMUSCULAR | Status: DC | PRN
  Administered 2012-10-22: 4 mg via INTRAMUSCULAR

## 2012-10-22 MED ORDER — MIDAZOLAM HCL 5 MG/5ML IJ SOLN
INTRAMUSCULAR | Status: DC | PRN
  Administered 2012-10-22: 2 mg via INTRAVENOUS

## 2012-10-22 MED ORDER — LACTATED RINGERS IV SOLN
INTRAVENOUS | Status: DC | PRN
  Administered 2012-10-22 (×3): via INTRAVENOUS

## 2012-10-22 MED ORDER — MENTHOL 3 MG MT LOZG: 1.0000 | LOZENGE | OROMUCOSAL | Status: DC | PRN

## 2012-10-22 MED ORDER — DEXAMETHASONE SODIUM PHOSPHATE 10 MG/ML IJ SOLN
10.0000 mg | Freq: Three times a day (TID) | INTRAMUSCULAR | Status: AC
  Administered 2012-10-22: 10 mg via INTRAVENOUS
  Filled 2012-10-22 (×3): qty 1

## 2012-10-22 MED ORDER — LIDOCAINE HCL (CARDIAC) 20 MG/ML IV SOLN
INTRAVENOUS | Status: DC | PRN
  Administered 2012-10-22: 30 mg via INTRAVENOUS

## 2012-10-22 MED ORDER — ACETAMINOPHEN 325 MG PO TABS: 650.0000 mg | ORAL_TABLET | Freq: Four times a day (QID) | ORAL | Status: DC | PRN

## 2012-10-22 MED ORDER — SODIUM CHLORIDE 0.9 % IJ SOLN
INTRAMUSCULAR | Status: DC | PRN
  Administered 2012-10-22: 10:00:00

## 2012-10-22 MED ORDER — CHLORHEXIDINE GLUCONATE 4 % EX LIQD: 60.0000 mL | Freq: Once | CUTANEOUS | Status: DC

## 2012-10-22 MED ORDER — CLINDAMYCIN PHOSPHATE 600 MG/50ML IV SOLN
600.0000 mg | Freq: Four times a day (QID) | INTRAVENOUS | Status: AC
Start: 2012-10-22 — End: 2012-10-22
  Administered 2012-10-22 (×2): 600 mg via INTRAVENOUS
  Filled 2012-10-22 (×2): qty 50

## 2012-10-22 MED ORDER — BUPIVACAINE LIPOSOME 1.3 % IJ SUSP
20.0000 mL | Freq: Once | INTRAMUSCULAR | Status: DC
  Filled 2012-10-22: qty 20

## 2012-10-22 MED ORDER — FENTANYL CITRATE 0.05 MG/ML IJ SOLN
INTRAMUSCULAR | Status: DC | PRN
  Administered 2012-10-22 (×2): 100 ug via INTRAVENOUS
  Administered 2012-10-22 (×2): 50 ug via INTRAVENOUS
  Administered 2012-10-22: 150 ug via INTRAVENOUS
  Administered 2012-10-22: 50 ug via INTRAVENOUS

## 2012-10-22 SURGICAL SUPPLY — 52 items
ADH SKN CLS LQ APL DERMABOND (GAUZE/BANDAGES/DRESSINGS)
BANDAGE GAUZE ELAST BULKY 4 IN (GAUZE/BANDAGES/DRESSINGS) IMPLANT
BLADE SAW SGTL 18X1.27X75 (BLADE) ×2 IMPLANT
BLADE SURG ROTATE 9660 (MISCELLANEOUS) IMPLANT
CLOTH BEACON ORANGE TIMEOUT ST (SAFETY) ×1 IMPLANT
COVER BACK TABLE 24X17X13 BIG (DRAPES) IMPLANT
COVER SURGICAL LIGHT HANDLE (MISCELLANEOUS) ×2 IMPLANT
DERMABOND ADHESIVE PROPEN (GAUZE/BANDAGES/DRESSINGS)
DERMABOND ADVANCED .7 DNX6 (GAUZE/BANDAGES/DRESSINGS) ×1 IMPLANT
DRAPE C-ARM 42X72 X-RAY (DRAPES) ×2 IMPLANT
DRAPE ORTHO SPLIT 77X108 STRL (DRAPES) ×8
DRAPE PROXIMA HALF (DRAPES) ×1 IMPLANT
DRAPE SURG ORHT 6 SPLT 77X108 (DRAPES) ×1 IMPLANT
DRAPE U-SHAPE 47X51 STRL (DRAPES) ×3 IMPLANT
DRSG AQUACEL AG ADV 3.5X10 (GAUZE/BANDAGES/DRESSINGS) ×1 IMPLANT
DURAPREP 26ML APPLICATOR (WOUND CARE) ×2 IMPLANT
ELECT BLADE 4.0 EZ CLEAN MEGAD (MISCELLANEOUS) ×2
ELECT BLADE TIP CTD 4 INCH (ELECTRODE) ×1 IMPLANT
ELECT CAUTERY BLADE 6.4 (BLADE) ×2 IMPLANT
ELECT REM PT RETURN 9FT ADLT (ELECTROSURGICAL) ×2
ELECTRODE BLDE 4.0 EZ CLN MEGD (MISCELLANEOUS) IMPLANT
ELECTRODE REM PT RTRN 9FT ADLT (ELECTROSURGICAL) ×1 IMPLANT
FACESHIELD LNG OPTICON STERILE (SAFETY) ×4 IMPLANT
GAUZE XEROFORM 1X8 LF (GAUZE/BANDAGES/DRESSINGS) ×1 IMPLANT
GLOVE BIO SURGEON STRL SZ7.5 (GLOVE) ×2 IMPLANT
GLOVE BIOGEL PI IND STRL 8 (GLOVE) ×2 IMPLANT
GLOVE BIOGEL PI INDICATOR 8 (GLOVE) ×2
GOWN STRL REIN XL XLG (GOWN DISPOSABLE) ×4 IMPLANT
HANDPIECE INTERPULSE COAX TIP (DISPOSABLE)
HIP/CERM HD VIT E LINR LEV 1C ×1 IMPLANT
KIT BASIN OR (CUSTOM PROCEDURE TRAY) ×2 IMPLANT
KIT ROOM TURNOVER OR (KITS) ×2 IMPLANT
MANIFOLD NEPTUNE II (INSTRUMENTS) ×2 IMPLANT
NS IRRIG 1000ML POUR BTL (IV SOLUTION) ×2 IMPLANT
PACK TOTAL JOINT (CUSTOM PROCEDURE TRAY) ×2 IMPLANT
PAD ARMBOARD 7.5X6 YLW CONV (MISCELLANEOUS) ×4 IMPLANT
SET HNDPC FAN SPRY TIP SCT (DISPOSABLE) ×1 IMPLANT
SPONGE LAP 18X18 X RAY DECT (DISPOSABLE) IMPLANT
SPONGE LAP 4X18 X RAY DECT (DISPOSABLE) IMPLANT
STAPLER VISISTAT 35W (STAPLE) ×2 IMPLANT
STRIP CLOSURE SKIN 1/2X4 (GAUZE/BANDAGES/DRESSINGS) ×1 IMPLANT
SUT ETHIBOND NAB CT1 #1 30IN (SUTURE) ×4 IMPLANT
SUT MNCRL AB 3-0 PS2 27 (SUTURE) ×2 IMPLANT
SUT MON AB 2-0 CT1 27 (SUTURE) ×4 IMPLANT
SUT VIC AB 0 CT1 27 (SUTURE) ×4
SUT VIC AB 0 CT1 27XBRD ANBCTR (SUTURE) ×2 IMPLANT
SUT VIC AB 1 CT1 27 (SUTURE) ×4
SUT VIC AB 1 CT1 27XBRD ANBCTR (SUTURE) ×2 IMPLANT
TOWEL OR 17X24 6PK STRL BLUE (TOWEL DISPOSABLE) ×2 IMPLANT
TOWEL OR 17X26 10 PK STRL BLUE (TOWEL DISPOSABLE) ×2 IMPLANT
TRAY FOLEY CATH 16FRSI W/METER (SET/KITS/TRAYS/PACK) IMPLANT
WATER STERILE IRR 1000ML POUR (IV SOLUTION) ×4 IMPLANT

## 2012-10-22 NOTE — Anesthesia Postprocedure Evaluation (Signed)
  Anesthesia Post-op Note  Patient: [name] Pope  Procedure(s) Performed: Procedure(s): RIGHT TOTAL HIP ARTHROPLASTY ANTERIOR APPROACH (Right)  Patient Location: PACU  Anesthesia Type:General  Level of Consciousness: awake, alert  and oriented  Airway and Oxygen Therapy: Patient Spontanous Breathing and Patient connected to nasal cannula oxygen  Post-op Pain: mild  Post-op Assessment: Post-op Vital signs reviewed, Patient's Cardiovascular Status Stable, Respiratory Function Stable, Patent Airway and Pain level controlled  Post-op Vital Signs: stable  Complications: No apparent anesthesia complications

## 2012-10-22 NOTE — Op Note (Signed)
10/22/2012  1:21 PM  PATIENT:  [name] Pope    PRE-OPERATIVE DIAGNOSIS:  OA RIGHT HIP  POST-OPERATIVE DIAGNOSIS:  Same  PROCEDURE:  RIGHT TOTAL HIP ARTHROPLASTY ANTERIOR APPROACH  SURGEON:  Sheral Apley, MD  ASSISTANT: Mckinley Jewel, MD  ANESTHESIA:   General  PREOPERATIVE INDICATIONS:  [name] Pope is a  52 y.o. male with a diagnosis of OA RIGHT HIP who failed conservative measures and elected for surgical management.    The risks benefits and alternatives were discussed with the patient preoperatively including but not limited to the risks of infection, bleeding, nerve injury, cardiopulmonary complications, the need for revision surgery, among others, and the patient was willing to proceed.  OPERATIVE IMPLANTS: Stryker THA, #4, 36 head, +2.5  OPERATIVE FINDINGS: Stable prosthesis  BLOOD LOSS: 600  COMPLICATIONS: None  TOURNIQUET TIME: none  OPERATIVE PROCEDURE:  Patient was identified in the preoperative holding area and site was marked by me He was transported to the operating theater and placed on the table in supine position taking care to pad all bony prominences. After a preincinduction time out anesthesia was induced. The right lower extremity was prepped and draped in normal sterile fashion and a pre-incision timeout was performed. [name] Pope received clindamycin for preoperative antibiotics.  The right lower extremity was examined for stability the hip and length. It was found to be slightly shorter than the left. I then made a 10 cm incision from a point just distal and lateral to the ASIS in line with the tensor fascia lata. Dissection was carried down to the fascia over the TFL. I then incised the superficial fascia and elevated the muscle posteriorly. I then made a nick in the deep fascia and I incised this using the Bovie to control blood loss. I then elevated the fat off the anterior capsule placed retractors and removed a section of the anterior  hip capsule. Next attention was turned to the femoral neck cut arm and a napkin ring of bone and made a femoral neck cut 1 fingerbreadth above the lesser trochanter. I then removed the femoral head and sized it. Acetabulum was then irrigated soft tissue was cleaned off around the edges acetabulum. I then sequentially reamed the acetabulum up to a 52 cup. I placed the [name] Pope took an x-ray and was happy with its position. I then placed a 52 PSL cup into the acetabulum and a 36 polyethylene liner.  I then turned attention to the femur the external rotators were elevated off the back of the femur so as to make it easy to deliver out of the wound. The femur was then sequentially broached to a #3trial broach. I placed a +5 head/neck I was happy with its stability. After removing the trial I was able to insert the #3 broach slightly deeper. At this point I elected to insert a #4 broach was able to place it to a satisfactory depth. I then trialed this with a +5 head. It was very difficult to reduce and did lengthen it slightly more than like. I selected a plus 2.5 ceramic head.  Inserted the #4 implant with a plus to 5 ceramic head. This reduced well as very happy with the stability the leg lengths the leg was slightly longer than the contralateral leg as he has significant AVN and wearing his contralateral leg as well. The patient did receive transaortic acid 1 g as I was concerned that blood loss was turned to increase. I performed a thorough  irrigation again of the entire wound. We did note that the trial head had a small piece of plastic break off of it. We did find this in the wound placed back in the head and found that this was a complete piece. I then closed the fascia over top of the tensor fascia lata. The wound was irrigated again and the soft tissue was closed using Monocryl absorbable suture. Sterile dressing was placed in his stretcher to the PACU in stable condition.  POST OPERATIVE PLAN: WBAT, ASA 325  and SCD for post operative DVT constrol, Clindamycin post op.

## 2012-10-22 NOTE — Progress Notes (Signed)
Orthopedic Tech Progress Note Patient Details:  [name] Pope 1960/06/05 [id number]  Ortho Devices Ortho Device/Splint Location: applied overhead frame to bed Ortho Device/Splint Interventions: Ordered;Application   [name] 10/22/2012, 7:05 PM

## 2012-10-22 NOTE — Transfer of Care (Signed)
Immediate Anesthesia Transfer of Care Note  Patient: [name] Pope  Procedure(s) Performed: Procedure(s): RIGHT TOTAL HIP ARTHROPLASTY ANTERIOR APPROACH (Right)  Patient Location: PACU  Anesthesia Type:General  Level of Consciousness: awake  Airway & Oxygen Therapy: Patient Spontanous Breathing and Patient connected to face mask oxygen  Post-op Assessment: Report given to PACU RN  Post vital signs: Reviewed and stable  Complications: No apparent anesthesia complications

## 2012-10-22 NOTE — Anesthesia Preprocedure Evaluation (Signed)
Anesthesia Evaluation  Patient identified by MRN, date of birth, ID band Patient awake    Reviewed: Allergy & Precautions, H&P , NPO status , Patient's Chart, lab work & pertinent test results  Airway Mallampati: II TM Distance: >3 FB Neck ROM: Full    Dental  (+) Teeth Intact and Dental Advisory Given   Pulmonary  breath sounds clear to auscultation        Cardiovascular Rhythm:Regular Rate:Normal     Neuro/Psych    GI/Hepatic   Endo/Other    Renal/GU      Musculoskeletal   Abdominal   Peds  Hematology   Anesthesia Other Findings   Reproductive/Obstetrics                           Anesthesia Physical Anesthesia Plan  ASA: I  Anesthesia Plan: General   Post-op Pain Management:    Induction: Intravenous  Airway Management Planned: Oral ETT  Additional Equipment:   Intra-op Plan:   Post-operative Plan: Extubation in OR  Informed Consent: I have reviewed the patients History and Physical, chart, labs and discussed the procedure including the risks, benefits and alternatives for the proposed anesthesia with the patient or authorized representative who has indicated his/her understanding and acceptance.   Dental advisory given  Plan Discussed with: CRNA and Anesthesiologist  Anesthesia Plan Comments: (DJD R. Hip  Plan GA with oral ETT  Kipp Brood, MD)        Anesthesia Quick Evaluation

## 2012-10-22 NOTE — Interval H&P Note (Signed)
History and Physical Interval Note:  10/22/2012 7:54 AM  [name] Pope  has presented today for surgery, with the diagnosis of OA RIGHT HIP  The various methods of treatment have been discussed with the patient and family. After consideration of risks, benefits and other options for treatment, the patient has consented to  Procedure(s): RIGHT TOTAL HIP ARTHROPLASTY ANTERIOR APPROACH (Right) as a surgical intervention .  The patient's history has been reviewed, patient examined, no change in status, stable for surgery.  I have reviewed the patient's chart and labs.  Questions were answered to the patient's satisfaction.     Jasiel Apachito, D

## 2012-10-22 NOTE — Anesthesia Procedure Notes (Signed)
Procedure Name: Intubation Date/Time: 10/22/2012 8:40 AM Performed by: Whitman Hero Pre-anesthesia Checklist: Patient identified, Timeout performed, Emergency Drugs available, Suction available and Patient being monitored Patient Re-evaluated:Patient Re-evaluated prior to inductionOxygen Delivery Method: Circle system utilized Preoxygenation: Pre-oxygenation with 100% oxygen Intubation Type: IV induction Ventilation: Mask ventilation without difficulty and Oral airway inserted - appropriate to patient size Laryngoscope Size: Mac and 3 Grade View: Grade I Tube type: Oral Tube size: 8.0 mm Number of attempts: 1 Airway Equipment and Method: Stylet Placement Confirmation: ETT inserted through vocal cords under direct vision,  breath sounds checked- equal and bilateral and positive ETCO2 Secured at: 23 cm Tube secured with: Tape Dental Injury: Teeth and Oropharynx as per pre-operative assessment

## 2012-10-23 LAB — HEMOGLOBIN AND HEMATOCRIT, BLOOD
HCT: 33.6 % — ABNORMAL LOW (ref 39.0–52.0)
Hemoglobin: 11.5 g/dL — ABNORMAL LOW (ref 13.0–17.0)

## 2012-10-23 MED ORDER — SODIUM CHLORIDE 0.9 % IV BOLUS (SEPSIS)
500.0000 mL | Freq: Once | INTRAVENOUS | Status: AC
  Administered 2012-10-23: 11:00:00 via INTRAVENOUS

## 2012-10-23 MED ORDER — CELECOXIB 100 MG PO CAPS
100.0000 mg | ORAL_CAPSULE | Freq: Two times a day (BID) | ORAL | Status: DC
  Administered 2012-10-23 – 2012-10-24 (×3): 100 mg via ORAL
  Filled 2012-10-23 (×4): qty 1

## 2012-10-23 MED ORDER — DIPHENHYDRAMINE HCL 25 MG PO CAPS
25.0000 mg | ORAL_CAPSULE | Freq: Four times a day (QID) | ORAL | Status: DC | PRN
  Administered 2012-10-23: 25 mg via ORAL
  Filled 2012-10-23: qty 1

## 2012-10-23 NOTE — Progress Notes (Signed)
Pt ambulating in the hall with PT. Pt became diaphoretic and dizzy. Pt assisted to chair and back to room where pt was assisted to bed. While in hall, pt VS obtained. BP standing was 79/47 96% RA. Pt placed in trendelenburg once assisted back to bed. Pt remains hard to arouse but eyes open and resps even and unlabored. Pt not tracking staff movements or responding. Rept to Dr. Eulah Pont. IV bolus of 0.9% saline 500 cc started. 1050 Pt BP in trendelenburg 115/70 P 81 97% RA. Shortly after BP stabilized and in trendelenburg, pt began to respond. Wife at bedside. 1100 Pt removed from trendelenburg. Pt requests wife. Wife at bedside holding pt's hand. Pt's BP 121/67 P 80 98% RA. No s/sx of distress and no c/o such. IV bolus continues. Pt denied that pain was "worse" with ambulation. Will continue to monitor.

## 2012-10-23 NOTE — Evaluation (Signed)
Physical Therapy Evaluation Patient Details Name: [name] Pope MRN: [id number] DOB: 1960/04/09 Today's Date: 10/23/2012 Time: 4098-1191 PT Time Calculation (min): 31 min  PT Assessment / Plan / Recommendation History of Present Illness  Pt s/p rt THA direct anterior approach.  Clinical Impression  Pt is s/p THA resulting in the deficits listed below (see PT Problem List). Pt with difficulties today with low BP. Pt will benefit from skilled PT to increase their independence and safety with mobility to allow discharge to the venue listed below.        PT Assessment  Patient needs continued PT services    Follow Up Recommendations  Home health PT;Supervision - Intermittent    Does the patient have the potential to tolerate intense rehabilitation      Barriers to Discharge        Equipment Recommendations  None recommended by PT    Recommendations for Other Services     Frequency 7X/week    Precautions / Restrictions Precautions Precautions: None Restrictions RLE Weight Bearing: Weight bearing as tolerated   Pertinent Vitals/Pain See flow sheet.  BP 79/47 with amb and pt symptomatic.      Mobility  Bed Mobility Bed Mobility: Supine to Sit;Sitting - Scoot to Edge of Bed Supine to Sit: 4: Min assist Sitting - Scoot to [place] of Bed: 4: Min assist Transfers Transfers: Sit to Stand;Stand to Sit Sit to Stand: 4: Min assist;With upper extremity assist;With armrests;From bed Stand to Sit: 4: Min assist;With upper extremity assist;With armrests;To chair/3-in-1 Details for Transfer Assistance: Verbal cues for hand placement.  Pt's lt leg is externally rotated at baseline and pt unable to place lt foot under his lt knee for sit to stand. Ambulation/Gait Ambulation/Gait Assistance: 4: Min assist Ambulation Distance (Feet): 40 Feet Assistive device: Rolling walker Ambulation/Gait Assistance Details: Verbal cues for technique. Pt became dizzy and had to sit in rolling desk  chair.  Rolled pt back to bed.  Pt barely responsive and with decr bp.  Pt transferred back to bed with anterior transfer with 4 person assist. Gait Pattern: Step-through pattern;Decreased stride length Gait velocity: decr General Gait Details: Pt amb with left leg in external rotation.    Exercises     PT Diagnosis: Difficulty walking;Acute pain;Abnormality of gait  PT Problem List: Decreased strength;Decreased activity tolerance;Decreased mobility;Decreased knowledge of use of DME;Pain;Cardiopulmonary status limiting activity PT Treatment Interventions: DME instruction;Gait training;Stair training;Functional mobility training;Therapeutic activities;Therapeutic exercise;Patient/family education     PT Goals(Current goals can be found in the care plan section) Acute Rehab PT Goals Patient Stated Goal: Return home PT Goal Formulation: With patient Time For Goal Achievement: 10/30/12 Potential to Achieve Goals: Good  Visit Information  Last PT Received On: 10/23/12 Assistance Needed: +1 History of Present Illness: Pt s/p rt THA direct anterior approach.       Prior Functioning  Home Living Family/patient expects to be discharged to:: Private residence Living Arrangements: Spouse/significant other Available Help at Discharge: Family Type of Home: House Home Access: Stairs to enter Secretary/administrator of Steps: 4 Entrance Stairs-Rails: Left Home Layout: One level Home Equipment: Environmental consultant - 2 wheels Prior Function Level of Independence: Independent with assistive device(s) Communication Communication: No difficulties    Cognition  Cognition Arousal/Alertness: Awake/alert Behavior During Therapy: WFL for tasks assessed/performed Overall Cognitive Status: Within Functional Limits for tasks assessed    Extremity/Trunk Assessment Lower Extremity Assessment Lower Extremity Assessment: LLE deficits/detail;RLE deficits/detail RLE Deficits / Details: Limited due to pain LLE  Deficits /  Details: Lt hip in external rotation.   Balance    End of Session PT - End of Session Equipment Utilized During Treatment: Gait belt Activity Tolerance: Treatment limited secondary to medical complications (Comment) (decr bp.)  GP     [facility] 10/23/2012, 12:59 PM  [facility] PT [phone]

## 2012-10-23 NOTE — Evaluation (Signed)
Occupational Therapy Evaluation Patient Details Name: [name] Pope MRN: [id number] DOB: 09-11-1960 Today's Date: 10/23/2012 Time: 4098-1191 OT Time Calculation (min): 24 min  OT Assessment / Plan / Recommendation History of present illness Pt s/p rt THA direct anterior approach.   Clinical Impression   Pt admitted with above. Will continue to follow acutely in order to address below problem list in prep for return home with family.    OT Assessment  Patient needs continued OT Services    Follow Up Recommendations  No OT follow up;Supervision/Assistance - 24 hour    Barriers to Discharge      Equipment Recommendations   (tub DME TBD)    Recommendations for Other Services    Frequency  Min 2X/week    Precautions / Restrictions Precautions Precautions: None Restrictions RLE Weight Bearing: Weight bearing as tolerated   Pertinent Vitals/Pain BP stable throughout session. No c/o dizziness.    ADL  Eating/Feeding: Performed;Independent Where Assessed - Eating/Feeding: Chair Grooming: Performed;Wash/dry hands;Wash/dry face;Teeth care;Supervision/safety Where Assessed - Grooming: Unsupported standing Upper Body Bathing: Simulated;Set up Where Assessed - Upper Body Bathing: Unsupported sitting Lower Body Bathing: Simulated;Minimal assistance Where Assessed - Lower Body Bathing: Unsupported sit to stand Upper Body Dressing: Performed;Set up Where Assessed - Upper Body Dressing: Unsupported sitting Lower Body Dressing: Simulated;Minimal assistance Where Assessed - Lower Body Dressing: Unsupported sit to stand Toilet Transfer: Simulated;Min guard Toilet Transfer Method: Sit to Barista:  (bed) Equipment Used: Gait belt;Rolling walker Transfers/Ambulation Related to ADLs: min guard with RW ADL Comments: Pt and wife are doubtful that 3n1 would fit in their tub. Will assess next session to determine if pt needs a chair vs bench.    OT Diagnosis:  Generalized weakness;Acute pain  OT Problem List: Decreased strength;Decreased activity tolerance;Impaired balance (sitting and/or standing);Decreased knowledge of use of DME or AE;Pain OT Treatment Interventions: Self-care/ADL training;DME and/or AE instruction;Therapeutic activities;Balance training;Patient/family education   OT Goals(Current goals can be found in the care plan section) Acute Rehab OT Goals Patient Stated Goal: Return home OT Goal Formulation: With patient Time For Goal Achievement: 10/30/12 Potential to Achieve Goals: Good  Visit Information  Last OT Received On: 10/23/12 Assistance Needed: +1 History of Present Illness: Pt s/p rt THA direct anterior approach.       Prior Functioning     Home Living Family/patient expects to be discharged to:: Private residence Living Arrangements: Spouse/significant other Available Help at Discharge: Family Type of Home: House Home Access: Stairs to enter Secretary/administrator of Steps: 4 Entrance Stairs-Rails: Left Home Layout: One level Home Equipment: Environmental consultant - 2 wheels;Bedside commode Prior Function Level of Independence: Independent with assistive device(s) Communication Communication: No difficulties         Vision/Perception Vision - History Baseline Vision: No visual deficits   Cognition  Cognition Arousal/Alertness: Awake/alert Behavior During Therapy: WFL for tasks assessed/performed Overall Cognitive Status: Within Functional Limits for tasks assessed    Extremity/Trunk Assessment Upper Extremity Assessment Upper Extremity Assessment: Overall WFL for tasks assessed Lower Extremity Assessment Lower Extremity Assessment: LLE deficits/detail;RLE deficits/detail RLE Deficits / Details: Limited due to pain LLE Deficits / Details: Lt hip in external rotation.     Mobility Bed Mobility Bed Mobility: Supine to Sit;Sitting - Scoot to Edge of Bed Supine to Sit: 4: Min guard;HOB elevated;With  rails Sitting - Scoot to Edge of Bed: 4: Min guard Transfers Transfers: Sit to Stand;Stand to Sit Sit to Stand: 4: Min guard;From bed;With upper extremity assist Stand to Sit:  4: Min guard;To chair/3-in-1;With armrests;With upper extremity assist Details for Transfer Assistance: VCs for hand placement.     Exercise     Balance     End of Session OT - End of Session Equipment Utilized During Treatment: Gait belt;Rolling walker Activity Tolerance: Patient tolerated treatment well Patient left: in chair;with call bell/phone within reach Nurse Communication: Mobility status  GO    10/23/2012 Cipriano Mile OTR/L Pager [phone] Office [phone]  Cipriano Mile 10/23/2012, 3:58 PM

## 2012-10-23 NOTE — Progress Notes (Signed)
    Subjective:  Patient reports pain as Moderate. C/o Low back pain. He has a history of Lumbago  Objective:   VITALS:   Filed Vitals:   10/23/12 1500 10/23/12 1513 10/23/12 1600 10/23/12 1949  BP: 134/75 138/79  123/65  Pulse:    74  Temp:    97.9 F (36.6 C)  TempSrc:    Oral  Resp:   18 18  SpO2:   100% 100%    Physical Exam RLE:  Dressing: C/D/I  Compartments soft  SILT DP/SP/S/S/T, 2+DP, +TA/GS/EHL  LABS  Results for orders placed during the hospital encounter of 10/22/12 (from the past 24 hour(s))  HEMOGLOBIN AND HEMATOCRIT, BLOOD     Status: Abnormal   Collection Time    10/23/12  4:20 AM      Result Value Range   Hemoglobin 11.5 (*) 13.0 - 17.0 g/dL   HCT 16.1 (*) 09.6 - 04.5 %     Assessment/Plan: 1 Day Post-Op   Active Problems:   * No active hospital problems. *   PLAN: Weight Bearing: WBAT, Ant hip precautions Dressings: CHange dressing on Friday to dry dressing VTE prophylaxis: SCD and ASA 325 Dispo: Likely Home on 10/17 Will start Celebrex for Lumbago   Shanelle Clontz, D 10/23/2012, 9:36 PM   Margarita Rana, MD Cell [phone]

## 2012-10-23 NOTE — Progress Notes (Signed)
Pt ambulated in the halls with PT. Pt ambulated down hall and around circle. No adverse s/sx noted. Pt smiling. Pt assisted back to room and to recliner. Will continue to monitor.

## 2012-10-23 NOTE — Progress Notes (Signed)
Clinical Social Worker (CSW) received referral for SNF. Per chart review PT is recommending home health. Please reconsult if further social work needs arise. CSW signing off.   Qusay Villada Morgan, LCSWA Weekend CSW [phone]  

## 2012-10-23 NOTE — Progress Notes (Signed)
Physical Therapy Treatment Patient Details Name: [name] Pope MRN: [id number] DOB: [birth year] Today's Date: 10/23/2012 Time: 4098-1191 PT Time Calculation (min): 24 min  PT Assessment / Plan / Recommendation  History of Present Illness Pt s/p rt THA direct anterior approach.   PT Comments   Pt making good progress.  No problems with low BP this session.  Follow Up Recommendations  Home health PT;Supervision - Intermittent     Does the patient have the potential to tolerate intense rehabilitation     Barriers to Discharge        Equipment Recommendations  Rolling walker with 5" wheels;3in1 (PT)    Recommendations for Other Services    Frequency 7X/week   Progress towards PT Goals Progress towards PT goals: Progressing toward goals  Plan Current plan remains appropriate    Precautions / Restrictions Precautions Precautions: None Restrictions RLE Weight Bearing: Weight bearing as tolerated   Pertinent Vitals/Pain See flow sheet.    Mobility  Bed Mobility Bed Mobility: Supine to Sit;Sitting - Scoot to Edge of Bed Supine to Sit: 4: Min guard;HOB elevated;With rails Sitting - Scoot to Edge of Bed: 4: Min guard Transfers Transfers: Sit to Stand;Stand to Sit Sit to Stand: 4: Min guard;From bed;With upper extremity assist Stand to Sit: 4: Min guard;To chair/3-in-1;With armrests;With upper extremity assist Details for Transfer Assistance: VCs for hand placement. Ambulation/Gait Ambulation/Gait Assistance: 4: Min guard Ambulation Distance (Feet): 175 Feet Assistive device: Rolling walker Ambulation/Gait Assistance Details: Verbal cues to keep feet withing walker. Gait Pattern: Step-through pattern;Decreased stride length Gait velocity: decr General Gait Details: Pt amb with left leg in external rotation.    Exercises     PT Diagnosis: Difficulty walking;Acute pain;Abnormality of gait  PT Problem List: Decreased strength;Decreased activity tolerance;Decreased  mobility;Decreased knowledge of use of DME;Pain;Cardiopulmonary status limiting activity PT Treatment Interventions: DME instruction;Gait training;Stair training;Functional mobility training;Therapeutic activities;Therapeutic exercise;Patient/family education   PT Goals (current goals can now be found in the care plan section) Acute Rehab PT Goals Patient Stated Goal: Return home PT Goal Formulation: With patient Time For Goal Achievement: 10/30/12 Potential to Achieve Goals: Good  Visit Information  Last PT Received On: 10/23/12 Assistance Needed: +1 PT/OT Co-Evaluation/Treatment: Yes History of Present Illness: Pt s/p rt THA direct anterior approach.    Subjective Data  Patient Stated Goal: Return home   Cognition  Cognition Arousal/Alertness: Awake/alert Behavior During Therapy: WFL for tasks assessed/performed Overall Cognitive Status: Within Functional Limits for tasks assessed    Balance     End of Session PT - End of Session Equipment Utilized During Treatment: Gait belt Activity Tolerance: Patient tolerated treatment well Patient left: in chair;with call bell/phone within reach;with family/visitor present Nurse Communication: Mobility status   GP     [name] Pope 10/23/2012, 4:09 PM  [employer] PT [phone]

## 2012-10-23 NOTE — Progress Notes (Signed)
   CARE MANAGEMENT NOTE 10/23/2012  Patient:  [name], [name]   Account Number:  [id number]  Date Initiated:  10/23/2012  Documentation initiated by:  [facility]  Subjective/Objective Assessment:   total right hip surgery     Action/Plan:   Anticipated DC Date:  10/24/2012   Anticipated DC Plan:  HOME W HOME HEALTH SERVICES      DC Planning Services  CM consult      [facility] Choice  HOME HEALTH   Choice offered to / List presented to:  C-1 Patient   DME arranged  3-N-1  WALKER - ROLLING      DME agency  [employer]     HH arranged  HH-2 PT      HH agency  [facility]   Status of service:  Completed, signed off Medicare Important Message given?   (If response is "NO", the following Medicare IM given date fields will be blank) Date Medicare IM given:   Date Additional Medicare IM given:    Discharge Disposition:  HOME W HOME HEALTH SERVICES  Per UR Regulation:    If discussed at Long Length of Stay Meetings, dates discussed:    Comments:  10/23/2012 1730 Pt requested [facility] for [facility]. Pt will need tub bench. Will discuss with [employer] on 10/17. Pt has Medicaid and item is covered under Medicaid. Isidoro Donning RN CCM Case Mgmt phone [phone]

## 2012-10-24 ENCOUNTER — Encounter (HOSPITAL_COMMUNITY): Payer: Self-pay | Admitting: Orthopedic Surgery

## 2012-10-24 MED ORDER — ASPIRIN 325 MG PO TBEC: 325.0000 mg | DELAYED_RELEASE_TABLET | Freq: Every day | ORAL | Status: DC

## 2012-10-24 MED ORDER — OXYCODONE HCL 5 MG PO TABS: 5.0000 mg | ORAL_TABLET | ORAL | Status: DC | PRN

## 2012-10-24 MED ORDER — MELOXICAM 15 MG PO TABS: 15.0000 mg | ORAL_TABLET | Freq: Every day | ORAL | Status: DC

## 2012-10-24 NOTE — Progress Notes (Signed)
[employer] provided tub transfer bench

## 2012-10-24 NOTE — Progress Notes (Signed)
Physical Therapy Treatment Patient Details Name: [name] Pope MRN: [id number] DOB: 1960-07-16 Today's Date: 10/24/2012 Time: 6010-9323 PT Time Calculation (min): 32 min  PT Assessment / Plan / Recommendation  History of Present Illness Pt s/p rt THA direct anterior approach.   PT Comments   Pt showing good progress towards physical therapy goals. Was able to perform transfers with mod I or supervision. He was also able to don shorts with min assist and a t-shirt independently at EOB. Pt reports he is to d/c home today. Will continue to follow until d/c.  Follow Up Recommendations  Home health PT;Supervision - Intermittent     Does the patient have the potential to tolerate intense rehabilitation     Barriers to Discharge        Equipment Recommendations  Rolling walker with 5" wheels;3in1 (PT)    Recommendations for Other Services    Frequency 7X/week   Progress towards PT Goals Progress towards PT goals: Progressing toward goals  Plan Current plan remains appropriate    Precautions / Restrictions Precautions Precautions: Fall Restrictions Weight Bearing Restrictions: Yes RLE Weight Bearing: Weight bearing as tolerated   Pertinent Vitals/Pain Pt reports 1/10 pain at rest.    Mobility  Bed Mobility Bed Mobility: Supine to Sit;Sitting - Scoot to Edge of Bed Supine to Sit: 6: Modified independent (Device/Increase time);HOB flat Sitting - Scoot to Edge of Bed: 6: Modified independent (Device/Increase time) Details for Bed Mobility Assistance: Pt required no physical assist and was able to perform transfer to EOB without use of railing or trapeze with HOB flat. Transfers Transfers: Sit to Stand;Stand to Sit Sit to Stand: 5: Supervision Stand to Sit: 5: Supervision Details for Transfer Assistance: VC's for hand placement on seated surface and to wait for walker and therapist to be present prior to initiating stand. Ambulation/Gait Ambulation/Gait Assistance: 5:  Supervision Ambulation Distance (Feet): 200 Feet Assistive device: Rolling walker Ambulation/Gait Assistance Details: VC's for sequencing with the RW, increased heel strike, increase toe off, and fluidity of walker motion. Gait Pattern: Step-through pattern;Decreased stride length;Wide base of support Gait velocity: decreased General Gait Details: Pt amb with left leg in external rotation. Stairs: Yes Stairs Assistance: 4: Min guard Stair Management Technique: Two rails;Forwards Number of Stairs: 5    Exercises Total Joint Exercises Ankle Circles/Pumps: 15 reps;Both Quad Sets: 15 reps;Both Towel Squeeze: 15 reps;Both Short Arc Quad: 15 reps;Left Heel Slides: 15 reps;Left Hip ABduction/ADduction: 15 reps;Left Straight Leg Raises: 15 reps;Left   PT Diagnosis:    PT Problem List:   PT Treatment Interventions:     PT Goals (current goals can now be found in the care plan section) Acute Rehab PT Goals Patient Stated Goal: Return home PT Goal Formulation: With patient Time For Goal Achievement: 10/30/12 Potential to Achieve Goals: Good  Visit Information  Last PT Received On: 10/24/12 Assistance Needed: +1 History of Present Illness: Pt s/p rt THA direct anterior approach.    Subjective Data  Subjective: "I am ready to go home." Patient Stated Goal: Return home   Cognition  Cognition Arousal/Alertness: Awake/alert Behavior During Therapy: WFL for tasks assessed/performed Overall Cognitive Status: Within Functional Limits for tasks assessed    Balance     End of Session PT - End of Session Equipment Utilized During Treatment: Gait belt Activity Tolerance: Patient tolerated treatment well Patient left: in chair;with call bell/phone within reach;with family/visitor present Nurse Communication: Mobility status   GP     Ruthann Cancer 10/24/2012, 12:12 PM  Ruthann Cancer, PT, DPT Acute Rehabilitation Services [phone]

## 2012-10-24 NOTE — Progress Notes (Signed)
PT Cancellation Note  Patient Details Name: [name] Pope MRN: [id number] DOB: 13-Apr-1960   Cancelled Treatment:    Reason Eval/Treat Not Completed: Patient declined, stating he is ready to go home. Pt reports he feels comfortable with mobility to get in and around his home and declines further PT services until d/c.   Ruthann Cancer 10/24/2012, 2:26 PM  Ruthann Cancer, PT, DPT Acute Rehabilitation Services [phone]

## 2012-10-24 NOTE — Progress Notes (Signed)
Occupational Therapy Treatment Patient Details Name: [name] Pope MRN: [id number] DOB: 27-Aug-1960 Today's Date: 10/24/2012 Time: 8469-6295 OT Time Calculation (min): 12 min  OT Assessment / Plan / Recommendation  History of present illness Pt s/p rt THA direct anterior approach.   OT comments  Pt progressing toward goals.  Pt anticipating d/c home later today.  Follow Up Recommendations  No OT follow up;Supervision/Assistance - 24 hour    Barriers to Discharge       Equipment Recommendations  Tub/shower bench    Recommendations for Other Services    Frequency Min 2X/week   Progress towards OT Goals Progress towards OT goals: Progressing toward goals  Plan Discharge plan remains appropriate    Precautions / Restrictions Precautions Precautions: Fall Restrictions Weight Bearing Restrictions: Yes RLE Weight Bearing: Weight bearing as tolerated   Pertinent Vitals/Pain See vitals    ADL  Lower Body Dressing: Performed;Minimal assistance Where Assessed - Lower Body Dressing: Unsupported sit to stand Toilet Transfer: Simulated;Supervision/safety Toilet Transfer Method: Sit to Barista:  (bed) Equipment Used: Rolling walker Transfers/Ambulation Related to ADLs: supervision sit<>stand with RW ADL Comments: Educated pt and wife on use of tub bench (bench had already been delivered to pt room).  Pt verbalized understanding and declined practicing tub transfer, stating he feels comfortble with it.  Pt requiring min assist to thread RLE through pants. Educated pt on use of  reacher to assist with LB dressing tasks.  Pt then pointed to wife and stated "she can help" and wife agreed.  Educated pt on dressing technique to thread RLE through clothing first and reverse process for doffing.      OT Diagnosis:    OT Problem List:   OT Treatment Interventions:     OT Goals(current goals can now be found in the care plan section) Acute Rehab OT Goals Patient  Stated Goal: Return home OT Goal Formulation: With patient Time For Goal Achievement: 10/30/12 Potential to Achieve Goals: Good ADL Goals Pt Will Perform Lower Body Dressing: with supervision;with adaptive equipment;sit to/from stand Pt Will Transfer to Toilet: with supervision;ambulating Pt Will Perform Tub/Shower Transfer: with supervision;ambulating;rolling walker  Visit Information  Last OT Received On: 10/24/12 Assistance Needed: +1 History of Present Illness: Pt s/p rt THA direct anterior approach.    Subjective Data      Prior Functioning       Cognition  Cognition Arousal/Alertness: Awake/alert Behavior During Therapy: WFL for tasks assessed/performed Overall Cognitive Status: Within Functional Limits for tasks assessed    Mobility  Bed Mobility Bed Mobility: Supine to Sit;Sitting - Scoot to Edge of Bed;Sit to Supine Supine to Sit: 6: Modified independent (Device/Increase time) Sitting - Scoot to Edge of Bed: 6: Modified independent (Device/Increase time) Sit to Supine: 6: Modified independent (Device/Increase time) Transfers Transfers: Sit to Stand;Stand to Sit Sit to Stand: 5: Supervision;From bed Stand to Sit: 5: Supervision;To bed    Exercises    Balance     End of Session OT - End of Session Equipment Utilized During Treatment: Rolling walker Activity Tolerance: Patient tolerated treatment well Patient left: in bed;with call bell/phone within reach;with family/visitor present  GO    10/24/2012 Cipriano Mile OTR/L Pager [phone] Office [phone]  Cipriano Mile 10/24/2012, 2:48 PM

## 2012-10-24 NOTE — Discharge Summary (Signed)
Physician Discharge Summary  Patient ID: [name] Pope MRN: [id number] DOB/AGE: [birth year] 52 y.o.  Admit date: 11/17/2012 Discharge date: 10/24/2012  Admission Diagnoses:  <principal problem not specified>  Discharge Diagnoses:  Active Problems:   * No active hospital problems. *   Past Medical History  Diagnosis Date  . Allergy   . Arthritis   . Asthma   . Osteoporosis   . Frequent urination at night   . Itching     all over body itching at bedtime    Surgeries: Procedure(s): RIGHT TOTAL HIP ARTHROPLASTY ANTERIOR APPROACH on November 17, 2012   Consultants (if any):    Discharged Condition: Improved  Hospital Course: [name] Pope is an 52 y.o. male who was admitted 2012/11/17 with a diagnosis of <principal problem not specified> and went to the operating room on Nov 17, 2012 and underwent the above named procedures.    He was given perioperative antibiotics:  Anti-infectives   Start     Dose/Rate Route Frequency Ordered Stop   Nov 17, 2012 1630  clindamycin (CLEOCIN) IVPB 600 mg     600 mg 100 mL/hr over 30 Minutes Intravenous Every 6 hours 17-Nov-2012 1539 2012/11/17 2301   Nov 17, 2012 0600  clindamycin (CLEOCIN) IVPB 900 mg     900 mg 100 mL/hr over 30 Minutes Intravenous On call to O.R. 10/21/12 1410 Nov 17, 2012 0842    .  He was given sequential compression devices, early ambulation, and ASA 325 for DVT prophylaxis.  He benefited maximally from the hospital stay and there were no complications.    Recent vital signs:  Filed Vitals:   10/24/12 0615  BP: 116/66  Pulse: 82  Temp: 99.1 F (37.3 C)  Resp: 18    Recent laboratory studies:  Lab Results  Component Value Date   HGB 11.5* 10/23/2012   HGB 14.1 10/14/2012   Lab Results  Component Value Date   WBC 4.3 10/14/2012   PLT 232 10/14/2012   Lab Results  Component Value Date   INR 1.01 10/14/2012   Lab Results  Component Value Date   NA 137 10/14/2012   K 3.8 10/14/2012   CL 101 10/14/2012   CO2 27  10/14/2012   BUN 10 10/14/2012   CREATININE 0.90 10/14/2012   GLUCOSE 105* 10/14/2012    Discharge Medications:     Medication List         aspirin 325 MG EC tablet  Take 1 tablet (325 mg total) by mouth daily with breakfast.     ibuprofen 200 MG tablet  Commonly known as:  ADVIL,MOTRIN  Take 600 mg by mouth every 6 (six) hours as needed for pain.     oxyCODONE 5 MG immediate release tablet  Commonly known as:  Oxy IR/ROXICODONE  Take 1-2 tablets (5-10 mg total) by mouth every 4 (four) hours as needed.        Diagnostic Studies: Dg Hip Operative Right  11/17/12   CLINICAL DATA:  Right hip replacement  EXAM: DG OPERATIVE RIGHT HIP  TECHNIQUE: Two intraoperative spot images of the right hip is submitted.  COMPARISON:  11/20/2011  FINDINGS: Two intraoperative spot images demonstrate changes of right hip replacement. No hardware or bony complicating feature. Normal alignment.  IMPRESSION: Right hip replacement. No complicating feature.   Electronically Signed   By: Charlett Nose M.D.   On: Nov 17, 2012 16:43   Dg Pelvis Portable  11/17/2012   CLINICAL DATA:  Status post right total hip arthroplasty.  EXAM: PORTABLE PELVIS; PORTABLE RIGHT HIP - 1  VIEW  COMPARISON:  11/20/2011.  FINDINGS: Postoperative changes of right total hip arthroplasty are now noted. No definite periprosthetic fracture is identified. The right prosthetic femoral head projects within the right acetabulum on this single view examination.  IMPRESSION: 1. Postoperative changes of right total hip arthroplasty without immediate complicating features. .   Electronically Signed   By: Trudie Reed M.D.   On: 10/22/2012 14:25   Dg Hip Portable 1 View Right  10/22/2012   *RADIOLOGY REPORT*  Clinical Data: Postop right total hip replacement, initial encounter.  PORTABLE RIGHT HIP - 1 VIEW  Comparison: AP pelvis radiograph - earlier same day; the right hip radiographic series - 11/20/2011  Findings:  This examination is  interpreted in conjunction with AP pelvis radiograph performed earlier same day.  Images demonstrate the sequela of right total hip replacement.  No evidence of hardware failure or loosening.  Alignment appears near anatomic.  No fracture.  There is a minimal amount of expected subcutaneous emphysema about the operative site.  No radiopaque foreign body.  Mild to moderate degenerative change is suspected in the contralateral left hip.  IMPRESSION: Post right total hip replacement without evidence of complication.   Original Report Authenticated By: Tacey Ruiz, MD    Disposition: 01-Home or Self Care       Future Appointments Provider Department Dept Phone   10/28/2012 1:30 PM Beverley Fiedler, MD [facility] [phone]      Follow-up Information   Follow up with [facility]. [facility])    Contact information:   [address] [place] [address] [phone]        Signed: Sheral Apley 10/24/2012, 1:43 PM

## 2012-10-27 ENCOUNTER — Encounter: Payer: Self-pay | Admitting: Internal Medicine

## 2012-10-28 ENCOUNTER — Ambulatory Visit: Payer: Medicaid Other | Admitting: Internal Medicine

## 2012-11-08 ENCOUNTER — Emergency Department (HOSPITAL_COMMUNITY)
Admission: EM | Admit: 2012-11-08 | Discharge: 2012-11-08 | Disposition: A | Discharge status: Home / Self Care | Payer: Medicaid Other | Attending: Emergency Medicine | Admitting: Emergency Medicine

## 2012-11-08 ENCOUNTER — Encounter (HOSPITAL_COMMUNITY): Payer: Self-pay | Admitting: Emergency Medicine

## 2012-11-08 DIAGNOSIS — Z9889 Other specified postprocedural states: Secondary | ICD-10-CM | POA: Insufficient documentation

## 2012-11-08 DIAGNOSIS — Z7982 Long term (current) use of aspirin: Secondary | ICD-10-CM | POA: Insufficient documentation

## 2012-11-08 DIAGNOSIS — K921 Melena: Secondary | ICD-10-CM | POA: Insufficient documentation

## 2012-11-08 DIAGNOSIS — Z8739 Personal history of other diseases of the musculoskeletal system and connective tissue: Secondary | ICD-10-CM | POA: Insufficient documentation

## 2012-11-08 DIAGNOSIS — Z8719 Personal history of other diseases of the digestive system: Secondary | ICD-10-CM

## 2012-11-08 DIAGNOSIS — Z880 Allergy status to penicillin: Secondary | ICD-10-CM | POA: Insufficient documentation

## 2012-11-08 DIAGNOSIS — J45909 Unspecified asthma, uncomplicated: Secondary | ICD-10-CM | POA: Insufficient documentation

## 2012-11-08 DIAGNOSIS — Z791 Long term (current) use of non-steroidal anti-inflammatories (NSAID): Secondary | ICD-10-CM | POA: Insufficient documentation

## 2012-11-08 DIAGNOSIS — M129 Arthropathy, unspecified: Secondary | ICD-10-CM | POA: Insufficient documentation

## 2012-11-08 LAB — CBC
HCT: 30.8 % — ABNORMAL LOW (ref 39.0–52.0)
MCH: 30.3 pg (ref 26.0–34.0)
MCHC: 33.4 g/dL (ref 30.0–36.0)
MCV: 90.6 fL (ref 78.0–100.0)
RBC: 3.4 MIL/uL — ABNORMAL LOW (ref 4.22–5.81)
RDW: 14.2 % (ref 11.5–15.5)

## 2012-11-08 LAB — COMPREHENSIVE METABOLIC PANEL
Albumin: 3.4 g/dL — ABNORMAL LOW (ref 3.5–5.2)
Alkaline Phosphatase: 78 U/L (ref 39–117)
BUN: 16 mg/dL (ref 6–23)
Calcium: 8.7 mg/dL (ref 8.4–10.5)
Creatinine, Ser: 0.73 mg/dL (ref 0.50–1.35)
GFR calc Af Amer: >90 mL/min (ref >90)
Potassium: 3.7 mEq/L (ref 3.5–5.1)
Total Bilirubin: 0.2 mg/dL — ABNORMAL LOW (ref 0.3–1.2)
Total Protein: 7.3 g/dL (ref 6.0–8.3)

## 2012-11-08 LAB — OCCULT BLOOD, POC DEVICE: Fecal Occult Bld: POSITIVE — AB

## 2012-11-08 LAB — TYPE AND SCREEN
ABO/RH(D): A POS
Antibody Screen: NEGATIVE

## 2012-11-08 NOTE — ED Provider Notes (Signed)
CSN: [id number]     Arrival date & time 11/08/12  1859 History   First MD Initiated Contact with Patient 11/08/12 2102     Chief Complaint  Patient presents with  . Rectal Bleeding   (Consider location/radiation/quality/duration/timing/severity/associated sxs/prior Treatment) Patient is a 52 y.o. male presenting with hematochezia. The history is provided by the patient. No language interpreter was used.  Rectal Bleeding Quality:  Bright red Amount:  Scant Duration:  1 week Timing: with bm's. Context: hemorrhoids   Context: not diarrhea, not rectal injury and not rectal pain   Context comment:  Reports hemorrhoids Similar prior episodes: yes   Worsened by:  Nothing tried Associated symptoms: no abdominal pain, no dizziness, no fever, no hematemesis, no light-headedness, no recent illness and no vomiting   pt is a 52 year old male who recently had hip surgery two weeks ago. He reports that for the first 4 days post-op he was not able to have a bowel movement. He says that he had already been discharged home from the hospital and her took some milk of magnesia which produced a bowel movement, at that time he was constipated and had a difficult time passing stool. He reports that he had some bright red blood at that time and has continued to have small amounts of blood when he has a stool. He reports seeing blood on the toilet paper when he wipes and a small amount mixed with stool. He denies any dark, bloody stool, abdominal pain, nausea, vomiting or back pain. He reports that he has been eating and drinking fine and has had a good appetite. He is urinating without any difficulty.   Past Medical History  Diagnosis Date  . Allergy   . Arthritis   . Asthma   . Osteoporosis   . Frequent urination at night   . Itching     all over body itching at bedtime   Past Surgical History  Procedure Laterality Date  . Appendectomy    . Total hip arthroplasty Right 10/22/2012    Procedure: RIGHT  TOTAL HIP ARTHROPLASTY ANTERIOR APPROACH;  Surgeon: Sheral Apley, MD;  Location: [facility] OR;  Service: Orthopedics;  Laterality: Right;   Family History  Problem Relation Age of Onset  . Diabetes Mother   . Diabetes Father   . Hypertension Father    History  Substance Use Topics  . Smoking status: Never Smoker   . Smokeless tobacco: Not on file  . Alcohol Use: 0.6 oz/week    1 Cans of beer per week     Comment: daily    Review of Systems  Constitutional: Negative for fever and chills.  Cardiovascular: Negative for chest pain.  Gastrointestinal: Positive for hematochezia. Negative for nausea, vomiting, abdominal pain, abdominal distention, rectal pain and hematemesis.  Genitourinary: Negative for dysuria.  Neurological: Negative for dizziness and light-headedness.  All other systems reviewed and are negative.    Allergies  Penicillins  Home Medications   Current Outpatient Rx  Name  Route  Sig  Dispense  Refill  . aspirin EC 325 MG EC tablet   Oral   Take 1 tablet (325 mg total) by mouth daily with breakfast.   30 tablet   0   . ibuprofen (ADVIL,MOTRIN) 200 MG tablet   Oral   Take 600 mg by mouth every 6 (six) hours as needed for pain.         . meloxicam (MOBIC) 15 MG tablet   Oral   Take  1 tablet (15 mg total) by mouth daily.   60 tablet   1   . methocarbamol (ROBAXIN) 500 MG tablet   Oral   Take 500 mg by mouth 4 (four) times daily as needed.         Marland Kitchen oxyCODONE (OXY IR/ROXICODONE) 5 MG immediate release tablet   Oral   Take 1-2 tablets (5-10 mg total) by mouth every 4 (four) hours as needed.   90 tablet   0    BP 135/80  Pulse 91  Temp(Src) 98.2 F (36.8 C) (Oral)  Ht 5\' 9"  (1.753 m)  Wt 207 lb (93.895 kg)  BMI 30.55 kg/m2  SpO2 100% Physical Exam  Nursing note and vitals reviewed. Constitutional: He is oriented to person, place, and time. He appears well-developed and well-nourished. No distress.  HENT:  Head: Normocephalic and  atraumatic.  Mouth/Throat: Oropharynx is clear and moist.  Eyes: Conjunctivae and EOM are normal. Pupils are equal, round, and reactive to light.  Neck: Normal range of motion. Neck supple. No tracheal deviation present. No thyromegaly present.  Cardiovascular: Normal rate, regular rhythm, normal heart sounds and intact distal pulses.   Abdominal: Soft. Bowel sounds are normal. He exhibits no distension. There is no tenderness.  Genitourinary: Rectum normal. Rectal exam shows no external hemorrhoid and no internal hemorrhoid.  Musculoskeletal: Normal range of motion.  Lymphadenopathy:    He has no cervical adenopathy.  Neurological: He is oriented to person, place, and time. He has normal reflexes.  Skin: Skin is warm and dry.  Psychiatric: He has a normal mood and affect. His behavior is normal. Judgment and thought content normal.    ED Course  Procedures (including critical care time) Labs Review Labs Reviewed  CBC  COMPREHENSIVE METABOLIC PANEL  TYPE AND SCREEN   Imaging Review No results found.  EKG Interpretation   None       MDM   1. History of rectal bleeding    Pt has had a history of bright red blood from his rectum for the last few days. This is after he had a hip surgery and then a difficult bm with constipation. He has a history of hemorrhoids although, none seen externally and none were palpable on rectal exam. He reports that blood has been small amounts and has been bright red, mostly appearing on the toilet paper when he wipes. He reports that he has a colonoscopy scheduled for 12/08/2012. His Hgb is 10.3 today, down from 11.5 on 10/23/12 but he has had surgery since then. He is hemoccult + but VS are stable, he has not had any tachycardia, dizziness or increased amounts of blood from his rectum. He is stable for discharge and agrees to follow-up with his PCP on Monday or Tuesday.       Irish Elders, NP 11/08/12 2330

## 2012-11-08 NOTE — ED Notes (Signed)
Pt c/o bright red rectal bleeding. Pt states he had hip replacement 2 weeks ago. Pt states he had no BM x 4 days post op. Pt states began having bright red bleeding with BMs. Denies pain. Also c/o mid low back pain onset yesterday, denies injury.

## 2012-11-09 LAB — ABO/RH: ABO/RH(D): A POS

## 2012-11-09 NOTE — ED Provider Notes (Signed)
Medical screening examination/treatment/procedure(s) were performed by non-physician practitioner and as supervising physician I was immediately available for consultation/collaboration.  EKG Interpretation   None         Candyce Churn, MD 11/09/12 [phone]

## 2012-12-09 ENCOUNTER — Ambulatory Visit: Payer: Medicaid Other | Admitting: Internal Medicine

## 2012-12-09 ENCOUNTER — Encounter: Payer: Self-pay | Admitting: Internal Medicine

## 2013-04-20 NOTE — H&P (Signed)
  Arushi Partridge/[facility] [address]   [address], [place] [id number] [phone] A Division of [facility]  Loreta Aveaniel F. Tymon Nemetz, M.D.   Robert A. Thurston HoleWainer, M.D.   Burnell BlanksW. Dan Caffrey, M.D.   Eulas PostJoshua P. Landau, M.D.   Lunette StandsAnna Voytek, M.D Jewel Baizeimothy D. Eulah PontMurphy, M.D.  Buford DresserWesley R. Ibazebo, M.D.  Estell HarpinJames S. Kramer, M.D.    Melina Fiddlerebecca S. Bassett, M.D. Mary L. Isidoro DonningAnton, PA-C  Kirstin A. Shepperson, PA-C  Josh Perlahadwell, PA-C [place], [place]   RE: [name] CallanderBenton, [name]   [phone]      DOB: [birth year] PROGRESS NOTE: 01-14-13 Reason for visit:  Follow-up right total hip arthroplasty anterior. Date of surgery: 10/22/12. 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 History of present illness: He is 2  months out from surgery is improving steadily. He still has aching pain and a little itching around his scar and tenderness in the skin. He denies any systemic symptoms. Left hip is more painful than the right per his report and he has had occasional aching in the right knee off and on in the past. No injury to the right knee.   Please see associated documentation for this clinic visit for further past medical, family, surgical and social history, review of systems, and exam findings as this was reviewed by me.  EXAMINATION: Well appearing male in no apparent distress.  The right lower extremity is neurovascularly intact. Incision is benign he has painless hip range of motion. Knee has some tenderness over the lateral joint line otherwise stable to ligamentous exam. Left lower extremity very painful with hip range of motion positive Stinchfield negative straight leg raise.  X-RAYS: None obtained.  ASSESSMENT: Doing well after the above surgery on the right hip.  PLAN: 1. He is scheduled for left anterior total hip arthroplasty in May. 2. I give him a prescription for Norco and Mobic. 3.  Follow-up in 3 months just  prior to his left hip surgery. 4. At his next visit we should get x-rays of his right knee.  Jewel Baizeimothy D.  Eulah PontMurphy, M.D.  Electronically verified by Jewel Baizeimothy D. Eulah PontMurphy, M.D. TDM:kah D 01-14-13 T 01-15-13

## 2013-04-27 ENCOUNTER — Encounter (HOSPITAL_COMMUNITY): Payer: Self-pay | Admitting: Pharmacy Technician

## 2013-04-30 NOTE — Pre-Procedure Instructions (Signed)
[name] FeilWilliam A Pope  04/30/2013   Your procedure is scheduled on:  Tues, May 5 @ 7:30 AM  [facility].  Call this number if you have problems the morning of surgery: [phone]   Remember:   Do not eat food or drink liquids after midnight.   Take these medicines the morning of surgery with A SIP OF WATER: Pain Pill(if needed)               Stop taking your Aspirin,Ibuprofen,and Mobic. No Goody's,BC's,Aleve,Fish Oil,or any Herbal Medications   Do not wear jewelry  Do not wear lotions, powders, or colognes. You may wear deodorant.  Men may shave face and neck.  Do not bring valuables to the hospital.  [facility] not [facility]                  for any belongings or valuables.               Contacts, dentures or bridgework may not be worn into surgery.  Leave suitcase in the car. After surgery it may be brought to your room.  For patients admitted to the hospital, discharge time is determined by your                treatment team.                Special Instructions:  [place] - Preparing for Surgery  Before surgery, you can play an important role.  Because skin is not sterile, your skin needs to be as free of germs as possible.  You can reduce the number of germs on you skin by washing with CHG (chlorahexidine gluconate) soap before surgery.  CHG is an antiseptic cleaner which kills germs and bonds with the skin to continue killing germs even after washing.  Please DO NOT use if you have an allergy to CHG or antibacterial soaps.  If your skin becomes reddened/irritated stop using the CHG and inform your nurse when you arrive at Short Stay.  Do not shave (including legs and underarms) for at least 48 hours prior to the first CHG shower.  You may shave your face.  Please follow these instructions carefully:   1.  Shower with CHG Soap the night before surgery and the                                morning of Surgery.  2.  If you choose to wash your  hair, wash your hair first as usual with your       normal shampoo.  3.  After you shampoo, rinse your hair and body thoroughly to remove the                      Shampoo.  4.  Use CHG as you would any other liquid soap.  You can apply chg directly       to the skin and wash gently with scrungie or a clean washcloth.  5.  Apply the CHG Soap to your body ONLY FROM THE NECK DOWN.        Do not use on open wounds or open sores.  Avoid contact with your eyes,       ears, mouth and genitals (private parts).  Wash genitals (private parts)       with your normal soap.  6.  Wash thoroughly, paying special attention to the area where your surgery        will be performed.  7.  Thoroughly rinse your body with warm water from the neck down.  8.  DO NOT shower/wash with your normal soap after using and rinsing off       the CHG Soap.  9.  Pat yourself dry with a clean towel.            10.  Wear clean pajamas.            11.  Place clean sheets on your bed the night of your first shower and do not        sleep with pets.  Day of Surgery  Do not apply any lotions/deoderants the morning of surgery.  Please wear clean clothes to the hospital/surgery center.     Please read over the following fact sheets that you were given: Pain Booklet, Coughing and Deep Breathing, Blood Transfusion Information, MRSA Information and Surgical Site Infection Prevention

## 2013-05-01 ENCOUNTER — Encounter (HOSPITAL_COMMUNITY)
Admission: RE | Admit: 2013-05-01 | Discharge: 2013-05-01 | Disposition: A | Discharge status: Home / Self Care | Payer: Medicaid Other | Source: Ambulatory Visit | Attending: Orthopedic Surgery | Admitting: Orthopedic Surgery

## 2013-05-01 ENCOUNTER — Encounter (HOSPITAL_COMMUNITY): Payer: Self-pay

## 2013-05-01 DIAGNOSIS — Z01812 Encounter for preprocedural laboratory examination: Secondary | ICD-10-CM | POA: Insufficient documentation

## 2013-05-01 DIAGNOSIS — Z01810 Encounter for preprocedural cardiovascular examination: Secondary | ICD-10-CM | POA: Insufficient documentation

## 2013-05-01 DIAGNOSIS — Z01818 Encounter for other preprocedural examination: Secondary | ICD-10-CM | POA: Insufficient documentation

## 2013-05-01 LAB — URINALYSIS, ROUTINE W REFLEX MICROSCOPIC
Bilirubin Urine: NEGATIVE
Glucose, UA: NEGATIVE mg/dL
HGB URINE DIPSTICK: NEGATIVE
Ketones, ur: NEGATIVE mg/dL
Leukocytes, UA: NEGATIVE
Nitrite: NEGATIVE
PROTEIN: NEGATIVE mg/dL
Specific Gravity, Urine: 1.019 (ref 1.005–1.030)
Urobilinogen, UA: 0.2 mg/dL (ref 0.0–1.0)
pH: 6 (ref 5.0–8.0)

## 2013-05-01 LAB — BASIC METABOLIC PANEL
BUN: 10 mg/dL (ref 6–23)
CO2: 24 meq/L (ref 19–32)
CREATININE: 0.88 mg/dL (ref 0.50–1.35)
Calcium: 9.2 mg/dL (ref 8.4–10.5)
Chloride: 104 mEq/L (ref 96–112)
GFR calc Af Amer: >90 mL/min (ref >90)
GFR calc non Af Amer: >90 mL/min (ref >90)
Glucose, Bld: 116 mg/dL — ABNORMAL HIGH (ref 70–99)
Potassium: 4.3 mEq/L (ref 3.7–5.3)
SODIUM: 140 meq/L (ref 137–147)

## 2013-05-01 LAB — CBC
HCT: 42.9 % (ref 39.0–52.0)
Hemoglobin: 14.4 g/dL (ref 13.0–17.0)
MCH: 29.5 pg (ref 26.0–34.0)
MCHC: 33.6 g/dL (ref 30.0–36.0)
MCV: 87.9 fL (ref 78.0–100.0)
PLATELETS: 213 10*3/uL (ref 150–400)
RBC: 4.88 MIL/uL (ref 4.22–5.81)
RDW: 14.1 % (ref 11.5–15.5)
WBC: 3.6 10*3/uL — ABNORMAL LOW (ref 4.0–10.5)

## 2013-05-01 LAB — SURGICAL PCR SCREEN
MRSA, PCR: NEGATIVE
STAPHYLOCOCCUS AUREUS: NEGATIVE

## 2013-05-01 LAB — TYPE AND SCREEN
ABO/RH(D): A POS
ANTIBODY SCREEN: NEGATIVE

## 2013-05-01 LAB — APTT: APTT: 28 s (ref 24–37)

## 2013-05-01 LAB — PROTIME-INR
INR: 0.98 (ref 0.00–1.49)
Prothrombin Time: 12.8 seconds (ref 11.6–15.2)

## 2013-05-01 NOTE — Progress Notes (Signed)
NOTIFIED ALLISON OF PATIENT STATING HE RECENTLY HAS HAD EPISODES OF LEFT SIDED CHEST PAIN  WITH OCCASIONAL LEFT ARM/HAND NUMBNESS.  YESTERDAY  CHEST PAIN LASTED 15 SEC AND COUPLE WEEKS AGO WOKE UP 400AM WITH CHEST PAIN.  PATIENT HAS NOT HAD ANY CARDIAC TESTS DONE AND [facility]  PATIENT WAS INSTRUCTED TO SEE DR. Faustino CongressLOWENSTEIN ASAP TO DETERMINE IF NEEDS FURTHER WORKUP(PER ALLISON INSTRUCTION).

## 2013-05-01 NOTE — Progress Notes (Addendum)
Anesthesia PAT Evaluation: Patient is a 53 year old male scheduled for left THA, anterior approach on 05/12/13 by Dr. Margarita Ranaimothy Murphy. He is status post right THA on 10/22/2012.   Other history includes arthritis, headaches, asthma, osteoporosis, appendectomy.    I was asked to evaluate him due to reports of chest pain X 2 within the past week, last being on 04/30/13.  The first episode occurred at 4 AM and lasted 35 seconds and the second yesterday afternoon for 15-20 seconds. He has been eating a lot of pork chops and fatty foods and thinks this is contributing. Pain is in his left chest and rather sharp and quick.  No radiation.  No jaw or arm pain.  No associated diaphoresis, nausea, palpitations, SOB.  No exertional symptoms.  He has gotten intermittent numbness and tingling (no weakness) in his left fingers that has been more positional. He feels his bilateral thighs, just above his knees are swollen, but otherwise no pedal or pretibial edema. He used to be very active, ie dancing, playing hard ball, running, but activity has been limited for three years due to hip pain. He is able to fish and walk short distances, but otherwise is not very active.  He denies any family history of CAD, no previous cardiac studies, and no history of DM.  He considers Dr. Milus GlazierLauenstein to be his PCP, however, visits have been limited because Dr. Loma BostonLauenstein's office does not take his Medicaid card.  EKG on 05/01/13 showed: NSR, cannot rule out anterior infarct (age undetermined). There are no comparison EKGs in Epic or in [facility].   CXR on 05/01/13 showed: No active cardiopulmonary disease.  Preoperative labs noted. Urine culture are pending.  Patient's chest pain symptoms seem fairly atypical, but with two episodes within the past week.  No exertional symptoms.  I reviewed with anesthesiologist Dr. Chaney MallingHodierne.  He did recommend that patient see his PCP preoperatively to ensure no additional testing was felt warranted prior to  surgery.  Apparently, Dr. Milus GlazierLauenstein is out of the office until Wednesday 05/06/13.  He is planning to see him then. I advised him to seek immediate medical attention if any progressive symptoms before then.  I have left a voice message with Tresa EndoKelly at Dr. Greig RightMurphy's office updating her on the anesthesiologist's recommendations.  Velna Ochsllison Lesly Joslyn, PA-C [facility]Anesthesiology Phone [phone] 05/01/2013 4:09 PM  Addendum: 05/08/2013 9:23 AM Patient was evaluated on 05/07/13 by Dr. Elvina SidleKurt Lauenstein who forwarded me a letter of clearance for planned surgery. I called and notified Tresa EndoKelly at Dr. Greig RightMurphy's office and faxed her a copy of the letter.

## 2013-05-02 LAB — URINE CULTURE: Colony Count: 3000

## 2013-05-07 ENCOUNTER — Encounter: Payer: Self-pay | Admitting: Family Medicine

## 2013-05-11 MED ORDER — TRANEXAMIC ACID 100 MG/ML IV SOLN
1000.0000 mg | INTRAVENOUS | Status: AC
  Administered 2013-05-12: 1000 mg via INTRAVENOUS
  Filled 2013-05-11: qty 10

## 2013-05-11 MED ORDER — ACETAMINOPHEN 500 MG PO TABS
1000.0000 mg | ORAL_TABLET | Freq: Once | ORAL | Status: AC
  Administered 2013-05-12: 1000 mg via ORAL
  Filled 2013-05-11: qty 2

## 2013-05-11 MED ORDER — CHLORHEXIDINE GLUCONATE 4 % EX LIQD
60.0000 mL | Freq: Once | CUTANEOUS | Status: DC
  Filled 2013-05-11: qty 60

## 2013-05-11 MED ORDER — CLINDAMYCIN PHOSPHATE 900 MG/50ML IV SOLN
900.0000 mg | INTRAVENOUS | Status: AC
  Administered 2013-05-12: 900 mg via INTRAVENOUS
  Filled 2013-05-11: qty 50

## 2013-05-11 MED ORDER — DEXTROSE-NACL 5-0.45 % IV SOLN: 100.0000 mL/h | INTRAVENOUS | Status: DC

## 2013-05-12 ENCOUNTER — Encounter (HOSPITAL_COMMUNITY): Payer: Medicaid Other | Admitting: Vascular Surgery

## 2013-05-12 ENCOUNTER — Inpatient Hospital Stay (HOSPITAL_COMMUNITY)
Admission: RE | Admit: 2013-05-12 | Discharge: 2013-05-13 | DRG: 470 | Disposition: A | Discharge status: Home Health | Payer: Medicaid Other | Source: Ambulatory Visit | Attending: Orthopedic Surgery | Admitting: Orthopedic Surgery

## 2013-05-12 ENCOUNTER — Inpatient Hospital Stay (HOSPITAL_COMMUNITY): Payer: Medicaid Other

## 2013-05-12 ENCOUNTER — Encounter (HOSPITAL_COMMUNITY)
Admission: RE | Disposition: A | Discharge status: Home Health | Payer: Self-pay | Source: Ambulatory Visit | Attending: Orthopedic Surgery

## 2013-05-12 ENCOUNTER — Encounter (HOSPITAL_COMMUNITY): Payer: Self-pay | Admitting: Anesthesiology

## 2013-05-12 ENCOUNTER — Inpatient Hospital Stay (HOSPITAL_COMMUNITY): Payer: Medicaid Other | Admitting: Certified Registered Nurse Anesthetist

## 2013-05-12 DIAGNOSIS — M1610 Unilateral primary osteoarthritis, unspecified hip: Principal | ICD-10-CM | POA: Diagnosis present

## 2013-05-12 DIAGNOSIS — M169 Osteoarthritis of hip, unspecified: Principal | ICD-10-CM | POA: Diagnosis present

## 2013-05-12 DIAGNOSIS — M810 Age-related osteoporosis without current pathological fracture: Secondary | ICD-10-CM | POA: Diagnosis present

## 2013-05-12 DIAGNOSIS — M1990 Unspecified osteoarthritis, unspecified site: Secondary | ICD-10-CM | POA: Diagnosis present

## 2013-05-12 DIAGNOSIS — J45909 Unspecified asthma, uncomplicated: Secondary | ICD-10-CM | POA: Diagnosis present

## 2013-05-12 HISTORY — PX: TOTAL HIP ARTHROPLASTY: SHX124

## 2013-05-12 SURGERY — ARTHROPLASTY, HIP, TOTAL, ANTERIOR APPROACH
Anesthesia: General | Site: Hip | Laterality: Left

## 2013-05-12 MED ORDER — SODIUM CHLORIDE 0.9 % IJ SOLN
INTRAMUSCULAR | Status: DC | PRN
  Administered 2013-05-12: 40 mL

## 2013-05-12 MED ORDER — ONDANSETRON HCL 4 MG/2ML IJ SOLN: 4.0000 mg | Freq: Four times a day (QID) | INTRAMUSCULAR | Status: DC | PRN

## 2013-05-12 MED ORDER — FENTANYL CITRATE 0.05 MG/ML IJ SOLN
INTRAMUSCULAR | Status: DC | PRN
  Administered 2013-05-12 (×11): 50 ug via INTRAVENOUS

## 2013-05-12 MED ORDER — METOCLOPRAMIDE HCL 5 MG/ML IJ SOLN
5.0000 mg | Freq: Three times a day (TID) | INTRAMUSCULAR | Status: DC | PRN
  Filled 2013-05-12: qty 2

## 2013-05-12 MED ORDER — GLYCOPYRROLATE 0.2 MG/ML IJ SOLN
INTRAMUSCULAR | Status: DC | PRN
  Administered 2013-05-12: .6 mg via INTRAVENOUS

## 2013-05-12 MED ORDER — DEXAMETHASONE 6 MG PO TABS
10.0000 mg | ORAL_TABLET | Freq: Three times a day (TID) | ORAL | Status: AC
  Administered 2013-05-12 – 2013-05-13 (×3): 10 mg via ORAL
  Filled 2013-05-12 (×3): qty 1

## 2013-05-12 MED ORDER — OXYCODONE HCL 5 MG PO TABS: 5.0000 mg | ORAL_TABLET | Freq: Once | ORAL | Status: DC | PRN

## 2013-05-12 MED ORDER — ONDANSETRON HCL 4 MG/2ML IJ SOLN
4.0000 mg | Freq: Four times a day (QID) | INTRAMUSCULAR | Status: DC | PRN
  Administered 2013-05-12: 4 mg via INTRAVENOUS
  Filled 2013-05-12 (×2): qty 2

## 2013-05-12 MED ORDER — ONDANSETRON HCL 4 MG PO TABS
4.0000 mg | ORAL_TABLET | Freq: Four times a day (QID) | ORAL | Status: DC | PRN
Start: 2013-05-12 — End: 2013-05-13
  Filled 2013-05-12: qty 1

## 2013-05-12 MED ORDER — FENTANYL CITRATE 0.05 MG/ML IJ SOLN
INTRAMUSCULAR | Status: AC
  Filled 2013-05-12: qty 5

## 2013-05-12 MED ORDER — LIDOCAINE HCL (CARDIAC) 20 MG/ML IV SOLN
INTRAVENOUS | Status: DC | PRN
  Administered 2013-05-12: 80 mg via INTRAVENOUS

## 2013-05-12 MED ORDER — METHOCARBAMOL 500 MG PO TABS
500.0000 mg | ORAL_TABLET | Freq: Four times a day (QID) | ORAL | Status: DC | PRN
  Administered 2013-05-12 – 2013-05-13 (×3): 500 mg via ORAL
  Filled 2013-05-12 (×3): qty 1

## 2013-05-12 MED ORDER — DEXAMETHASONE SODIUM PHOSPHATE 10 MG/ML IJ SOLN
10.0000 mg | Freq: Three times a day (TID) | INTRAMUSCULAR | Status: AC
  Filled 2013-05-12 (×3): qty 1

## 2013-05-12 MED ORDER — BUPIVACAINE LIPOSOME 1.3 % IJ SUSP
20.0000 mL | Freq: Once | INTRAMUSCULAR | Status: DC
  Filled 2013-05-12: qty 20

## 2013-05-12 MED ORDER — NEOSTIGMINE METHYLSULFATE 10 MG/10ML IV SOLN
INTRAVENOUS | Status: DC | PRN
  Administered 2013-05-12: 4 mg via INTRAVENOUS

## 2013-05-12 MED ORDER — ACETAMINOPHEN 650 MG RE SUPP: 650.0000 mg | Freq: Four times a day (QID) | RECTAL | Status: DC | PRN

## 2013-05-12 MED ORDER — LACTATED RINGERS IV SOLN
INTRAVENOUS | Status: DC | PRN
  Administered 2013-05-12 (×4): via INTRAVENOUS

## 2013-05-12 MED ORDER — HYDROMORPHONE HCL PF 1 MG/ML IJ SOLN
INTRAMUSCULAR | Status: AC
  Filled 2013-05-12: qty 1

## 2013-05-12 MED ORDER — DOCUSATE SODIUM 100 MG PO CAPS
100.0000 mg | ORAL_CAPSULE | Freq: Two times a day (BID) | ORAL | Status: DC
  Administered 2013-05-13: 100 mg via ORAL
  Filled 2013-05-12 (×2): qty 1

## 2013-05-12 MED ORDER — PROPOFOL 10 MG/ML IV BOLUS
INTRAVENOUS | Status: DC | PRN
  Administered 2013-05-12: 200 mg via INTRAVENOUS

## 2013-05-12 MED ORDER — METHOCARBAMOL 500 MG PO TABS
ORAL_TABLET | ORAL | Status: AC
  Filled 2013-05-12: qty 1

## 2013-05-12 MED ORDER — DEXTROSE-NACL 5-0.45 % IV SOLN
INTRAVENOUS | Status: DC
  Administered 2013-05-12: 23:00:00 via INTRAVENOUS

## 2013-05-12 MED ORDER — MIDAZOLAM HCL 5 MG/5ML IJ SOLN
INTRAMUSCULAR | Status: DC | PRN
  Administered 2013-05-12: 2 mg via INTRAVENOUS

## 2013-05-12 MED ORDER — MIDAZOLAM HCL 2 MG/2ML IJ SOLN
INTRAMUSCULAR | Status: AC
  Filled 2013-05-12: qty 2

## 2013-05-12 MED ORDER — SUCCINYLCHOLINE CHLORIDE 20 MG/ML IJ SOLN
INTRAMUSCULAR | Status: AC
  Filled 2013-05-12: qty 1

## 2013-05-12 MED ORDER — MENTHOL 3 MG MT LOZG: 1.0000 | LOZENGE | OROMUCOSAL | Status: DC | PRN

## 2013-05-12 MED ORDER — MORPHINE SULFATE 2 MG/ML IJ SOLN
2.0000 mg | INTRAMUSCULAR | Status: DC | PRN
  Administered 2013-05-12: 2 mg via INTRAVENOUS
  Filled 2013-05-12: qty 1

## 2013-05-12 MED ORDER — ACETAMINOPHEN 325 MG PO TABS: 650.0000 mg | ORAL_TABLET | Freq: Four times a day (QID) | ORAL | Status: DC | PRN

## 2013-05-12 MED ORDER — KETOROLAC TROMETHAMINE 30 MG/ML IJ SOLN
INTRAMUSCULAR | Status: DC | PRN
  Administered 2013-05-12: 30 mg via INTRAVENOUS

## 2013-05-12 MED ORDER — NEOSTIGMINE METHYLSULFATE 10 MG/10ML IV SOLN
INTRAVENOUS | Status: AC
  Filled 2013-05-12: qty 1

## 2013-05-12 MED ORDER — METOCLOPRAMIDE HCL 5 MG PO TABS
5.0000 mg | ORAL_TABLET | Freq: Three times a day (TID) | ORAL | Status: DC | PRN
  Filled 2013-05-12: qty 2

## 2013-05-12 MED ORDER — OXYCODONE HCL 5 MG/5ML PO SOLN: 5.0000 mg | Freq: Once | ORAL | Status: DC | PRN

## 2013-05-12 MED ORDER — CLINDAMYCIN PHOSPHATE 600 MG/50ML IV SOLN
600.0000 mg | Freq: Four times a day (QID) | INTRAVENOUS | Status: AC
  Administered 2013-05-12 (×2): 600 mg via INTRAVENOUS
  Filled 2013-05-12 (×2): qty 50

## 2013-05-12 MED ORDER — OXYCODONE HCL 5 MG PO TABS
5.0000 mg | ORAL_TABLET | ORAL | Status: DC | PRN
  Administered 2013-05-12 – 2013-05-13 (×4): 10 mg via ORAL
  Filled 2013-05-12 (×4): qty 2

## 2013-05-12 MED ORDER — KETOROLAC TROMETHAMINE 30 MG/ML IJ SOLN
INTRAMUSCULAR | Status: AC
  Filled 2013-05-12: qty 1

## 2013-05-12 MED ORDER — GLYCOPYRROLATE 0.2 MG/ML IJ SOLN
INTRAMUSCULAR | Status: AC
  Filled 2013-05-12: qty 3

## 2013-05-12 MED ORDER — ARTIFICIAL TEARS OP OINT
TOPICAL_OINTMENT | OPHTHALMIC | Status: DC | PRN
  Administered 2013-05-12: 1 via OPHTHALMIC

## 2013-05-12 MED ORDER — CELECOXIB 200 MG PO CAPS
200.0000 mg | ORAL_CAPSULE | Freq: Two times a day (BID) | ORAL | Status: DC
  Administered 2013-05-12 – 2013-05-13 (×2): 200 mg via ORAL
  Filled 2013-05-12 (×3): qty 1

## 2013-05-12 MED ORDER — ONDANSETRON HCL 4 MG/2ML IJ SOLN
INTRAMUSCULAR | Status: DC | PRN
  Administered 2013-05-12: 4 mg via INTRAVENOUS

## 2013-05-12 MED ORDER — 0.9 % SODIUM CHLORIDE (POUR BTL) OPTIME
TOPICAL | Status: DC | PRN
  Administered 2013-05-12: 1000 mL

## 2013-05-12 MED ORDER — PHENOL 1.4 % MT LIQD: 1.0000 | OROMUCOSAL | Status: DC | PRN

## 2013-05-12 MED ORDER — HYDROMORPHONE HCL PF 1 MG/ML IJ SOLN
0.2500 mg | INTRAMUSCULAR | Status: DC | PRN
Start: 2013-05-12 — End: 2013-05-12
  Administered 2013-05-12 (×4): 0.5 mg via INTRAVENOUS

## 2013-05-12 MED ORDER — ROCURONIUM BROMIDE 100 MG/10ML IV SOLN
INTRAVENOUS | Status: DC | PRN
  Administered 2013-05-12: 50 mg via INTRAVENOUS

## 2013-05-12 MED ORDER — ASPIRIN EC 325 MG PO TBEC
325.0000 mg | DELAYED_RELEASE_TABLET | Freq: Every day | ORAL | Status: DC
  Administered 2013-05-13: 325 mg via ORAL
  Filled 2013-05-12 (×2): qty 1

## 2013-05-12 MED ORDER — BUPIVACAINE LIPOSOME 1.3 % IJ SUSP
INTRAMUSCULAR | Status: DC | PRN
  Administered 2013-05-12: 20 mL

## 2013-05-12 SURGICAL SUPPLY — 64 items
BLADE 10 SAFETY STRL DISP (BLADE) ×3 IMPLANT
BLADE SAW SGTL 18X1.27X75 (BLADE) ×2 IMPLANT
BLADE SAW SGTL 18X1.27X75MM (BLADE) ×1
BNDG COHESIVE 4X5 TAN STRL (GAUZE/BANDAGES/DRESSINGS) ×2 IMPLANT
CLOSURE STERI-STRIP 1/2X4 (GAUZE/BANDAGES/DRESSINGS) ×1
CLSR STERI-STRIP ANTIMIC 1/2X4 (GAUZE/BANDAGES/DRESSINGS) ×1 IMPLANT
COVER SURGICAL LIGHT HANDLE (MISCELLANEOUS) ×5 IMPLANT
DRAPE C-ARM 42X72 X-RAY (DRAPES) ×5 IMPLANT
DRAPE IMP U-DRAPE 54X76 (DRAPES) ×3 IMPLANT
DRAPE INCISE IOBAN 66X45 STRL (DRAPES) ×3 IMPLANT
DRAPE ORTHO SPLIT 77X108 STRL (DRAPES) ×6
DRAPE PROXIMA HALF (DRAPES) ×3 IMPLANT
DRAPE SURG 17X23 STRL (DRAPES) ×3 IMPLANT
DRAPE SURG ORHT 6 SPLT 77X108 (DRAPES) ×2 IMPLANT
DRAPE U-SHAPE 47X51 STRL (DRAPES) ×6 IMPLANT
DRSG AQUACEL AG ADV 3.5X10 (GAUZE/BANDAGES/DRESSINGS) ×3 IMPLANT
DURAPREP 26ML APPLICATOR (WOUND CARE) ×3 IMPLANT
ELECT BLADE 4.0 EZ CLEAN MEGAD (MISCELLANEOUS) ×3
ELECT CAUTERY BLADE 6.4 (BLADE) ×3 IMPLANT
ELECT REM PT RETURN 9FT ADLT (ELECTROSURGICAL) ×3
ELECTRODE BLDE 4.0 EZ CLN MEGD (MISCELLANEOUS) ×1 IMPLANT
ELECTRODE REM PT RTRN 9FT ADLT (ELECTROSURGICAL) ×1 IMPLANT
FACESHIELD WRAPAROUND (MASK) ×6 IMPLANT
FACESHIELD WRAPAROUND OR TEAM (MASK) ×2 IMPLANT
GLOVE BIO SURGEON STRL SZ7 (GLOVE) ×2 IMPLANT
GLOVE BIO SURGEON STRL SZ7.5 (GLOVE) ×3 IMPLANT
GLOVE BIOGEL PI IND STRL 6.5 (GLOVE) IMPLANT
GLOVE BIOGEL PI IND STRL 7.5 (GLOVE) IMPLANT
GLOVE BIOGEL PI IND STRL 8 (GLOVE) ×2 IMPLANT
GLOVE BIOGEL PI INDICATOR 6.5 (GLOVE) ×2
GLOVE BIOGEL PI INDICATOR 7.5 (GLOVE) ×2
GLOVE BIOGEL PI INDICATOR 8 (GLOVE) ×4
GLOVE BIOGEL PI ORTHO PRO SZ8 (GLOVE) ×2
GLOVE PI ORTHO PRO STRL SZ8 (GLOVE) IMPLANT
GLOVE SURG ORTHO 8.0 STRL STRW (GLOVE) ×2 IMPLANT
GLOVE SURG SS PI 6.0 STRL IVOR (GLOVE) ×2 IMPLANT
GOWN STRL REUS W/ TWL LRG LVL3 (GOWN DISPOSABLE) ×3 IMPLANT
GOWN STRL REUS W/ TWL XL LVL3 (GOWN DISPOSABLE) ×1 IMPLANT
GOWN STRL REUS W/TWL LRG LVL3 (GOWN DISPOSABLE) ×9
GOWN STRL REUS W/TWL XL LVL3 (GOWN DISPOSABLE) ×3
HIP/CERM HD VIT E LINR LEV 1C ×2 IMPLANT
INSERT TRIDENT POLY 36MM 0DEG (Insert) IMPLANT
KIT BASIN OR (CUSTOM PROCEDURE TRAY) ×3 IMPLANT
KIT ROOM TURNOVER OR (KITS) ×3 IMPLANT
MANIFOLD NEPTUNE II (INSTRUMENTS) ×3 IMPLANT
NDL SAFETY ECLIPSE 18X1.5 (NEEDLE) ×1 IMPLANT
NEEDLE HYPO 18GX1.5 SHARP (NEEDLE) ×3
NS IRRIG 1000ML POUR BTL (IV SOLUTION) ×3 IMPLANT
PACK TOTAL JOINT (CUSTOM PROCEDURE TRAY) ×3 IMPLANT
PAD ARMBOARD 7.5X6 YLW CONV (MISCELLANEOUS) ×6 IMPLANT
PILLOW ABDUCTION HIP (SOFTGOODS) IMPLANT
SHELL TRIDENT PSL ACETABULAR (Orthopedic Implant) ×2 IMPLANT
SPONGE LAP 18X18 X RAY DECT (DISPOSABLE) IMPLANT
SUT MNCRL AB 4-0 PS2 18 (SUTURE) ×3 IMPLANT
SUT MON AB 2-0 CT1 36 (SUTURE) ×3 IMPLANT
SUT VIC AB 0 CT1 27 (SUTURE) ×3
SUT VIC AB 0 CT1 27XBRD ANBCTR (SUTURE) ×1 IMPLANT
SUT VIC AB 1 CT1 27 (SUTURE) ×3
SUT VIC AB 1 CT1 27XBRD ANBCTR (SUTURE) ×1 IMPLANT
SYR 50ML LL SCALE MARK (SYRINGE) ×3 IMPLANT
SYR BULB IRRIGATION 50ML (SYRINGE) ×2 IMPLANT
TOWEL OR 17X24 6PK STRL BLUE (TOWEL DISPOSABLE) ×3 IMPLANT
TOWEL OR 17X26 10 PK STRL BLUE (TOWEL DISPOSABLE) ×3 IMPLANT
TOWEL OR NON WOVEN STRL DISP B (DISPOSABLE) ×3 IMPLANT

## 2013-05-12 NOTE — Op Note (Signed)
05/12/2013  12:15 PM  PATIENT:  [name] Pope   MRN: [id number]  PRE-OPERATIVE DIAGNOSIS:  OA LEFT HIP  POST-OPERATIVE DIAGNOSIS:  OA LEFT HIP  PROCEDURE:  Procedure(s): LEFT TOTAL HIP ARTHROPLASTY ANTERIOR APPROACH  PREOPERATIVE INDICATIONS:    [name] Pope is an 53 y.o. male who has a diagnosis of <principal problem not specified> and elected for surgical management after failing conservative treatment.  The risks benefits and alternatives were discussed with the patient including but not limited to the risks of nonoperative treatment, versus surgical intervention including infection, bleeding, nerve injury, periprosthetic fracture, the need for revision surgery, dislocation, leg length discrepancy, blood clots, cardiopulmonary complications, morbidity, mortality, among others, and they were willing to proceed.     OPERATIVE REPORT     SURGEON:   Renette Butters, MD    ASSISTANT:  Joya Gaskins OPA     ANESTHESIA:  General    COMPLICATIONS:  None.     COMPONENTS:  Stryker acolade fit femur size 3 with a 36 mm +2.5 head ball and a PSL acetabular shell size 54 with a  polyethylene liner    PROCEDURE IN DETAIL:   The patient was met in the holding area and  identified.  The appropriate hip was identified and marked at the operative site.  The patient was then transported to the OR  and  placed under general anesthesia.  At that point, the patient was  placed in the supine position and  secured to the operating room table and all bony prominences padded. He received pre-operative antibiotics    The operative lower extremity was prepped from the iliac crest to the distal leg.  Sterile draping was performed.  Time out was performed prior to incision.      Skin incision was made just 3 cm distal to the ASIS and 3 cm posterior to it extending in line with the tensor fascia lata. Electrocautery was used to control all bleeders. I dissected down sharply to the fascia of the tensor  fascia lata was confirmed that the muscle fibers beneath were running posteriorly. I then incised the fascia over the superficial tensor fascia lata in line with the incision. The fascia was elevated off the anterior aspect of the muscle the muscle was retracted posteriorly and protected throughout the case. I then used electrocautery to incise the tensor fascia lata fascia control and all bleeders. Immediately visible was the fat over top of the anterior neck and capsule.  I removed the anterior fat from the capsule and elevated the rectus muscle off of the anterior capsule. I then removed a large time of capsule. The retractors were then placed over the anterior acetabulum as well as around the superior and inferior neck.  I then removed a section of the femoral neck and a napkin ring fashion. Then used the power course to remove the femoral head from the acetabulum and thoroughly irrigated the acetabulum. I sized the femoral head.    I then exposed the deep acetabulum, cleared out any tissue including the ligamentum teres.   After adequate visualization, I excised the labrum, and then sequentially reamed.  I placed the trial acetabulum, which seated nicely, and then impacted the real cup into place. I initially used a 52 cup however this would not stay stable I felt that we needed possibly larger size and initially tried to place a screw to hold this wound still however cup loosened while placement of the screw so this point I elected  to size up to a 54 cup and reamed to 53. I then impacted a 54 cup and it fit well I due to similarly early loosening I did elect to place 2, 20 mm of posterior superior screws for stabilization of the acetabular shell.  Appropriate version and inclination was confirmed clinically matching their bony anatomy, and also with the use transverse acetabular ligament.  I then abducted the leg and released the external rotators from the posterior femur allowing it to be easily  delivered up lateral and anterior to the acetabulum for preparation of the femoral canal.    I then prepared the proximal femur using the cookie-cutter and then sequentially reamed and broached.  A trial broach, neck, and head was utilized, and I reduced the hip and it was found to have excellent stability with functional range of motion. The trial components were then removed, and the real polyethylene liner was placed with the lip directed posteriorly.  I then impacted the real femoral prosthesis into place into the appropriate version, slightly anteverted to the normal anatomy, and I impacted the real head ball into place. The hip was then reduced and taken through functional range of motion and found to have excellent stability. Leg lengths were restored.  I then irrigated the hip copiously again with, and repaired the fascia with Vicryl, followed by monocryl for the subcutaneous tissue, Monocryl for the skin, Steri-Strips and sterile gauze. The wounds were injected. The patient was then awakened and returned to PACU in stable and satisfactory condition. There were no complications.  POST OPERATIVE PLAN: WBAT, DVT px: SCD's/TED and ASA 325 Daily  Edmonia Lynch, MD Orthopedic Surgeon [phone]   05/12/2013 12:15 PM

## 2013-05-12 NOTE — Plan of Care (Signed)
Problem: Consults Goal: Diagnosis- Total Joint Replacement Primary Total Hip Left     

## 2013-05-12 NOTE — Anesthesia Postprocedure Evaluation (Signed)
  Anesthesia Post-op Note  Patient: [name] Pope  Procedure(s) Performed: Procedure(s): LEFT TOTAL HIP ARTHROPLASTY ANTERIOR APPROACH (Left)  Patient Location: PACU  Anesthesia Type:General  Level of Consciousness: sedated, patient cooperative and responds to stimulation  Airway and Oxygen Therapy: Patient Spontanous Breathing and Patient connected to nasal cannula oxygen  Post-op Pain: mild  Post-op Assessment: Post-op Vital signs reviewed, Patient's Cardiovascular Status Stable, Respiratory Function Stable, Patent Airway, No signs of Nausea or vomiting and Pain level controlled  Post-op Vital Signs: Reviewed and stable  Last Vitals:  Filed Vitals:   05/12/13 1445  BP: 112/77  Pulse: 77  Temp:   Resp: 11    Complications: No apparent anesthesia complications

## 2013-05-12 NOTE — Anesthesia Preprocedure Evaluation (Signed)
Anesthesia Evaluation  Patient identified by MRN, date of birth, ID band Patient awake    Reviewed: Allergy & Precautions, H&P , NPO status , Patient's Chart, lab work & pertinent test results  Airway Mallampati: II  Neck ROM: full    Dental   Pulmonary asthma ,          Cardiovascular     Neuro/Psych  Headaches,    GI/Hepatic   Endo/Other  obese  Renal/GU      Musculoskeletal  (+) Arthritis -,   Abdominal   Peds  Hematology   Anesthesia Other Findings   Reproductive/Obstetrics                           Anesthesia Physical Anesthesia Plan  ASA: II  Anesthesia Plan: General   Post-op Pain Management:    Induction: Intravenous  Airway Management Planned: Oral ETT  Additional Equipment:   Intra-op Plan:   Post-operative Plan: Extubation in OR  Informed Consent: I have reviewed the patients History and Physical, chart, labs and discussed the procedure including the risks, benefits and alternatives for the proposed anesthesia with the patient or authorized representative who has indicated his/her understanding and acceptance.     Plan Discussed with: CRNA, Anesthesiologist and Surgeon  Anesthesia Plan Comments:         Anesthesia Quick Evaluation

## 2013-05-12 NOTE — Transfer of Care (Signed)
Immediate Anesthesia Transfer of Care Note  Patient: [name] Pope  Procedure(s) Performed: Procedure(s): LEFT TOTAL HIP ARTHROPLASTY ANTERIOR APPROACH (Left)  Patient Location: PACU  Anesthesia Type:General  Level of Consciousness: awake and responds to stimulation  Airway & Oxygen Therapy: Patient Spontanous Breathing and Patient connected to nasal cannula oxygen  Post-op Assessment: Report given to PACU RN, Post -op Vital signs reviewed and stable and Patient moving all extremities X 4  Post vital signs: Reviewed and stable  Complications: No apparent anesthesia complications

## 2013-05-12 NOTE — Interval H&P Note (Signed)
History and Physical Interval Note:  05/12/2013 9:00 AM  [name] Pope  has presented today for surgery, with the diagnosis of OA LEFT HIP  The various methods of treatment have been discussed with the patient and family. After consideration of risks, benefits and other options for treatment, the patient has consented to  Procedure(s): LEFT TOTAL HIP ARTHROPLASTY ANTERIOR APPROACH (Left) as a surgical intervention .  The patient's history has been reviewed, patient examined, no change in status, stable for surgery.  I have reviewed the patient's chart and labs.  Questions were answered to the patient's satisfaction.     Sheral Apleyimothy D Paiton Fosco

## 2013-05-13 ENCOUNTER — Encounter (HOSPITAL_COMMUNITY): Payer: Self-pay | Admitting: General Practice

## 2013-05-13 LAB — CBC
HCT: 36.4 % — ABNORMAL LOW (ref 39.0–52.0)
Hemoglobin: 12.3 g/dL — ABNORMAL LOW (ref 13.0–17.0)
MCH: 29.6 pg (ref 26.0–34.0)
MCHC: 33.8 g/dL (ref 30.0–36.0)
MCV: 87.7 fL (ref 78.0–100.0)
PLATELETS: 235 10*3/uL (ref 150–400)
RBC: 4.15 MIL/uL — AB (ref 4.22–5.81)
RDW: 14.5 % (ref 11.5–15.5)
WBC: 8.4 10*3/uL (ref 4.0–10.5)

## 2013-05-13 LAB — BASIC METABOLIC PANEL
BUN: 9 mg/dL (ref 6–23)
CHLORIDE: 99 meq/L (ref 96–112)
CO2: 25 mEq/L (ref 19–32)
Calcium: 8.6 mg/dL (ref 8.4–10.5)
Creatinine, Ser: 0.77 mg/dL (ref 0.50–1.35)
GFR calc non Af Amer: >90 mL/min (ref >90)
Glucose, Bld: 164 mg/dL — ABNORMAL HIGH (ref 70–99)
Potassium: 4.3 mEq/L (ref 3.7–5.3)
Sodium: 135 mEq/L — ABNORMAL LOW (ref 137–147)

## 2013-05-13 MED ORDER — OXYCODONE HCL 5 MG PO TABS: 10.0000 mg | ORAL_TABLET | ORAL | Status: DC | PRN

## 2013-05-13 MED ORDER — DOCUSATE SODIUM 100 MG PO CAPS: 100.0000 mg | ORAL_CAPSULE | Freq: Two times a day (BID) | ORAL | Status: DC

## 2013-05-13 MED ORDER — ASPIRIN EC 325 MG PO TBEC: 325.0000 mg | DELAYED_RELEASE_TABLET | Freq: Every day | ORAL | Status: DC

## 2013-05-13 MED ORDER — CELECOXIB 200 MG PO CAPS: 200.0000 mg | ORAL_CAPSULE | Freq: Two times a day (BID) | ORAL | Status: DC

## 2013-05-13 MED ORDER — ONDANSETRON HCL 4 MG PO TABS: 4.0000 mg | ORAL_TABLET | Freq: Three times a day (TID) | ORAL | Status: DC | PRN

## 2013-05-13 NOTE — Progress Notes (Signed)
Patient discharged to home accompanied by family. Discharge instructions and rx given and explained and patient stated understanding. IV was removed and patient left unit in a stable condition with all personal belongings via wheelchair.  

## 2013-05-13 NOTE — Care Management Note (Signed)
CARE MANAGEMENT NOTE 05/13/2013  Patient:  [name] Pope,[name] Pope   Account Number:  [id number]  Date Initiated:  05/13/2013  Documentation initiated by:  Vance PeperBRADY,Aly Hauser  Subjective/Objective Assessment:   53 yr old male s/p left total hip arthroplasty.     Action/Plan:   Case manager spoke with patient concerning home health and DME. [facility], no changes.   Anticipated DC Date:  05/13/2013   Anticipated DC Plan:  HOME W HOME HEALTH SERVICES      DC Planning Services  CM consult      [facility]  DURABLE MEDICAL EQUIPMENT   Choice offered to / List presented to:  C-1 Patient   DME arranged  [facility]  3-N-1      DME agency  [employer]     HH arranged  HH-2 PT      [facility]   Status of service:  Completed, signed off Medicare Important Message given?   (If response is "NO", the following Medicare IM given date fields will be blank) Date Medicare IM given:   Date Additional Medicare IM given:    Discharge Disposition:  HOME W HOME HEALTH SERVICES

## 2013-05-13 NOTE — Discharge Summary (Signed)
Physician Discharge Summary  Patient ID: [name] Pope MRN: [id number] DOB/AGE: 03/22/1960 53 y.o.  Admit date: 05/12/2013 Discharge date: 05/13/2013  Admission Diagnoses:  <principal problem not specified>  Discharge Diagnoses:  Active Problems:   DJD (degenerative joint disease)   Past Medical History  Diagnosis Date  . Allergy   . Arthritis   . Osteoporosis   . Frequent urination at night   . Itching     all over body itching at bedtime  . Asthma   . Headache(784.0)   . Anginal pain     CP for 15-35 seconds X 2 04/2013    Surgeries: Procedure(s): LEFT TOTAL HIP ARTHROPLASTY ANTERIOR APPROACH on 05/12/2013   Consultants (if any):    Discharged Condition: Improved  Hospital Course: [name] FeilWilliam A Pope is an 53 y.o. male who was admitted 05/12/2013 with a diagnosis of <principal problem not specified> and went to the operating room on 05/12/2013 and underwent the above named procedures.    He was given perioperative antibiotics:  Anti-infectives   Start     Dose/Rate Route Frequency Ordered Stop   05/12/13 1815  clindamycin (CLEOCIN) IVPB 600 mg     600 mg 100 mL/hr over 30 Minutes Intravenous Every 6 hours 05/12/13 1800 05/12/13 2349   05/12/13 0600  clindamycin (CLEOCIN) IVPB 900 mg     900 mg 100 mL/hr over 30 Minutes Intravenous On call to O.R. 05/11/13 1359 05/12/13 0924    .  He was given sequential compression devices, early ambulation, and ASA 325 for DVT prophylaxis.  He benefited maximally from the hospital stay and there were no complications.    Recent vital signs:  Filed Vitals:   05/13/13 0529  BP: 123/78  Pulse: 96  Temp: 98 F (36.7 C)  Resp: 18    Recent laboratory studies:  Lab Results  Component Value Date   HGB 12.3* 05/13/2013   HGB 14.4 05/01/2013   HGB 10.3* 11/08/2012   Lab Results  Component Value Date   WBC 8.4 05/13/2013   PLT 235 05/13/2013   Lab Results  Component Value Date   INR 0.98 05/01/2013   Lab Results  Component  Value Date   NA 135* 05/13/2013   K 4.3 05/13/2013   CL 99 05/13/2013   CO2 25 05/13/2013   BUN 9 05/13/2013   CREATININE 0.77 05/13/2013   GLUCOSE 164* 05/13/2013    Discharge Medications:     Medication List    STOP taking these medications       ibuprofen 200 MG tablet  Commonly known as:  ADVIL,MOTRIN     meloxicam 15 MG tablet  Commonly known as:  MOBIC     oxyCODONE-acetaminophen 5-325 MG per tablet  Commonly known as:  PERCOCET/ROXICET      TAKE these medications       aspirin EC 325 MG tablet  Take 1 tablet (325 mg total) by mouth daily.     celecoxib 200 MG capsule  Commonly known as:  CELEBREX  Take 1 capsule (200 mg total) by mouth 2 (two) times daily.     docusate sodium 100 MG capsule  Commonly known as:  COLACE  Take 1 capsule (100 mg total) by mouth 2 (two) times daily. Continue this while taking narcotics to help with bowel movements     methocarbamol 500 MG tablet  Commonly known as:  ROBAXIN  Take 500 mg by mouth 4 (four) times daily as needed for muscle spasms.  ondansetron 4 MG tablet  Commonly known as:  ZOFRAN  Take 1 tablet (4 mg total) by mouth every 8 (eight) hours as needed for nausea.     oxyCODONE 5 MG immediate release tablet  Commonly known as:  ROXICODONE  Take 2 tablets (10 mg total) by mouth every 4 (four) hours as needed for severe pain.        Diagnostic Studies: Dg Chest 2 View  05/01/2013   CLINICAL DATA:  HX  CHEST PAIN  EXAM: CHEST  2 VIEW  COMPARISON:  DG CHEST 2 VIEW dated 11/29/2003  FINDINGS: The heart size and mediastinal contours are within normal limits. Both lungs are clear. The visualized skeletal structures are unremarkable.  IMPRESSION: No active cardiopulmonary disease.   Electronically Signed   By: Salome HolmesHector  Cooper M.D.   On: 05/01/2013 12:58   Dg Pelvis Portable  05/12/2013   CLINICAL DATA:  53 year old male status post left hip surgery. Initial encounter.  EXAM: DG C-ARM 1-60 MIN - NRPT [facility]; PORTABLE PELVIS 1-2 VIEWS   COMPARISON:  Pelvis radiograph 10/22/2012.  FINDINGS: Portable AP view of the pelvis at 1320 hrs. Preexisting bipolar right hip arthroplasty. New left total hip arthroplasty. Hardware appears intact and normally aligned on this single view.  IMPRESSION: Interval total left hip arthroplasty with no adverse features.   Electronically Signed   By: Augusto GambleLee  Hall M.D.   On: 05/12/2013 14:21   Dg C-arm 1-60 Min-no Report  05/12/2013   CLINICAL DATA:  53 year old male status post left hip surgery. Initial encounter.  EXAM: DG C-ARM 1-60 MIN - NRPT [facility]; PORTABLE PELVIS 1-2 VIEWS  COMPARISON:  Pelvis radiograph 10/22/2012.  FINDINGS: Portable AP view of the pelvis at 1320 hrs. Preexisting bipolar right hip arthroplasty. New left total hip arthroplasty. Hardware appears intact and normally aligned on this single view.  IMPRESSION: Interval total left hip arthroplasty with no adverse features.   Electronically Signed   By: Augusto GambleLee  Hall M.D.   On: 05/12/2013 14:21    Disposition: 01-Home or Self Care        Follow-up Information   Follow up with [name] ApleyMURPHY, [name] Pope, D, MD. (as scheduled)    Specialty:  Orthopedic Surgery   Contact information:   [address] [place] [id number] [phone]        Signed: Sheral Apleyimothy D Sargon Pope 05/13/2013, 8:13 AM

## 2013-05-13 NOTE — Progress Notes (Signed)
Occupational Therapy Evaluation and Discharge Patient Details Name: [name] FeilWilliam A Pope MRN: [id number] DOB: 22-Apr-1960 Today's Date: 05/13/2013    History of Present Illness 53 y.o. male s/p left THA    Clinical Impression   PTA pt lived at home and was modified independent for ADLs and functional mobility with use of RW. Education and training completed for compensatory techniques for LB ADLs and incorporating hip precautions into ADLs. Pt recalled 3/3 hip precautions and reports wife will be home to assist. Pt has no further ADL concerns. Acute OT to sign off.     Follow Up Recommendations  No OT follow up;Supervision/Assistance - 24 hour    Equipment Recommendations  3 in 1 bedside comode       Precautions / Restrictions Precautions Precautions: Anterior Hip Precaution Booklet Issued: Yes (comment) Precaution Comments: Pt recalled 3/3 precautions; educated pt on incorporating into ADLs Restrictions Weight Bearing Restrictions: Yes LLE Weight Bearing: Weight bearing as tolerated      Mobility Bed Mobility               General bed mobility comments: Not assessed- pt sitting in recliner when OT arrived  Transfers Overall transfer level: Needs assistance Equipment used: Rolling walker (2 wheeled) Transfers: Sit to/from Stand Sit to Stand: Supervision              Balance Overall balance assessment: Modified Independent                                          ADL Overall ADL's : Needs assistance/impaired Eating/Feeding: Independent;Sitting   Grooming: Set up;Sitting   Upper Body Bathing: Set up;Sitting   Lower Body Bathing: Supervison/ safety;Adhering to hip precautions;Set up;Sit to/from stand   Upper Body Dressing : Set up;Sitting   Lower Body Dressing: Supervision/safety;Set up;Sit to/from stand;Adhering to hip precautions Lower Body Dressing Details (indicate cue type and reason): pt able to reach Bil LEs with increased time and  adhering to hip precautions Toilet Transfer: Supervision/safety;RW (sit<>stand)   Toileting- Clothing Manipulation and Hygiene: Supervision/safety;Sit to/from stand;Adhering to hip precautions   Tub/ Shower Transfer: Supervision/safety;Adhering to hip precautions;Rolling walker (sit<>stand)   Functional mobility during ADLs: Supervision/safety;Rolling walker General ADL Comments: Pt with good mobility and no further ADL concerns. Pt able to recall 3/3 hip precautions and educated on incorporating into ADLs.     Vision  Per pt report, no change from baseline.                   Perception Perception Perception Tested?: No   Praxis Praxis Praxis tested?: Within functional limits    Pertinent Vitals/Pain No c/o pain.      Hand Dominance Left   Extremity/Trunk Assessment Upper Extremity Assessment Upper Extremity Assessment: Overall WFL for tasks assessed   Lower Extremity Assessment Lower Extremity Assessment: Defer to PT evaluation   Cervical / Trunk Assessment Cervical / Trunk Assessment: Normal   Communication Communication Communication: No difficulties   Cognition Arousal/Alertness: Awake/alert Behavior During Therapy: WFL for tasks assessed/performed Overall Cognitive Status: Within Functional Limits for tasks assessed                                Home Living Family/patient expects to be discharged to:: Private residence Living Arrangements: Spouse/significant other Available Help at Discharge: Family Type of Home: House Home  Access: Stairs to enter Entergy CorporationEntrance Stairs-Number of Steps: 4 Entrance Stairs-Rails: Left Home Layout: Multi-level;Able to live on main level with bedroom/bathroom Alternate Level Stairs-Number of Steps:  (Pt does not need to go upstairs)   Bathroom Shower/Tub: Producer, television/film/videoWalk-in shower   Bathroom Toilet: Standard     Home Equipment: Environmental consultantWalker - 2 wheels;Bedside commode (uses BSC as shower seat)          Prior  Functioning/Environment Level of Independence: Independent with assistive device(s)        Comments: RW for mobility     End of Session Equipment Utilized During Treatment: Gait belt;Rolling walker  Activity Tolerance: Patient tolerated treatment well Patient left: in chair;with call bell/phone within reach   Time: 1002-1016 OT Time Calculation (min): 14 min Charges:  OT General Charges $OT Visit: 1 Procedure OT Evaluation $Initial OT Evaluation Tier I: 1 Procedure  Rae LipsLeeann M Trianna Lupien [phone] 05/13/2013, 1:09 PM

## 2013-05-13 NOTE — Progress Notes (Signed)
Utilization review completed.  

## 2013-05-13 NOTE — Evaluation (Signed)
Physical Therapy Evaluation & Discharge Patient Details Name: [name] Pope MRN: [id number] DOB: [birth year] Today's Date: 05/13/2013   History of Present Illness  53 y.o. male s/p left THA   Clinical Impression  Pt is s/p left THA - anterior approach, resulting in the deficits listed below (see PT Problem List). He ambulated up to 75 feet with supervision and has safely completed stair training. Pt states he feels safe with all mobility and reports he has no further concerns for physical therapy to cover. Reviewed all pertinent education with pt and he was able to verbalize 3/3 anterior hip precautions. Pt will benefit from skilled PT in home setting to increase their independence and safety with mobility. No further acute PT services needed. PT is signing-off at this time and is adequate for d/c home when medically ready. Thank you for this referral.     Follow Up Recommendations Home health PT;Supervision for mobility/OOB    Equipment Recommendations  None recommended by PT    Recommendations for Other Services OT consult     Precautions / Restrictions Precautions Precautions: Anterior Hip Precaution Booklet Issued: Yes (comment) Precaution Comments: Pt recalled 3/3 precautions; educated pt on incorporating into ADLs Restrictions Weight Bearing Restrictions: Yes LLE Weight Bearing: Weight bearing as tolerated      Mobility  Bed Mobility Overal bed mobility: Needs Assistance Bed Mobility: Supine to Sit     Supine to sit: Supervision     General bed mobility comments: Not assessed- pt sitting in recliner when OT arrived  Transfers Overall transfer level: Needs assistance Equipment used: Rolling walker (2 wheeled) Transfers: Sit to/from Stand Sit to Stand: Supervision         General transfer comment: Supervision for safety with cues for hand placement. Performed from lowest bed setting and recliner x2. Performs safely, requires extra  time  Ambulation/Gait Ambulation/Gait assistance: Supervision Ambulation Distance (Feet): 75 Feet Assistive device: Rolling walker (2 wheeled) Gait Pattern/deviations: Step-to pattern;Step-through pattern;Decreased step length - right;Decreased stance time - left;Antalgic Gait velocity: decreased   General Gait Details: Ambulates generally well with supervision for safety and min cues for sequencing. Focued on increasing right step length for step-through gait pattern.  Stairs Stairs: Yes Stairs assistance: Supervision Stair Management: One rail Left;Step to pattern;Sideways Number of Stairs: 2 (x2) General stair comments: Completed stair training safely. PT demonstrated followed by teach back method from patient. Safely demonstrates ability to ascend/descend stairs. Cues for sequencing and pt verbalizes understanding.  Wheelchair Mobility    Modified Rankin (Stroke Patients Only)       Balance Overall balance assessment: Modified Independent                                           Pertinent Vitals/Pain 5/10 pain - states nurse has recently given him pain medication Patient repositioned in chair for comfort.     Home Living Family/patient expects to be discharged to:: Private residence Living Arrangements: Spouse/significant other Available Help at Discharge: Family Type of Home: House Home Access: Stairs to enter Entrance Stairs-Rails: Left Entrance Stairs-Number of Steps: 4 Home Layout: Multi-level;Able to live on main level with bedroom/bathroom Home Equipment: Gilford Rile - 2 wheels;Bedside commode (uses BSC as shower seat)      Prior Function Level of Independence: Independent with assistive device(s)         Comments: RW for mobility     Hand  Dominance   Dominant Hand: Left    Extremity/Trunk Assessment   Upper Extremity Assessment: Overall WFL for tasks assessed           Lower Extremity Assessment: Defer to PT evaluation    LLE Deficits / Details: Decreased strength and ROM. Limited assessment due to immobility/precautions  Cervical / Trunk Assessment: Normal  Communication   Communication: No difficulties  Cognition Arousal/Alertness: Awake/alert Behavior During Therapy: WFL for tasks assessed/performed Overall Cognitive Status: Within Functional Limits for tasks assessed                      General Comments General comments (skin integrity, edema, etc.): Reviewed handout for anterior hip precautions. Pt able to recall 3/3 at end of therapy session.     Exercises Total Joint Exercises Ankle Circles/Pumps: AROM;Both;10 reps;Supine      Assessment/Plan    PT Assessment All further PT needs can be met in the next venue of care  PT Diagnosis Difficulty walking;Abnormality of gait;Acute pain   PT Problem List Decreased strength;Decreased range of motion;Decreased activity tolerance;Decreased balance;Decreased mobility;Decreased knowledge of use of DME;Decreased knowledge of precautions;Pain  PT Treatment Interventions     PT Goals (Current goals can be found in the Care Plan section) Acute Rehab PT Goals Patient Stated Goal: Go home PT Goal Formulation: No goals set, d/c therapy    Frequency     Barriers to discharge        Co-evaluation               End of Session Equipment Utilized During Treatment: Gait belt Activity Tolerance: Patient tolerated treatment well Patient left: in chair;with call bell/phone within reach Nurse Communication: Mobility status         Time: 0912-0939 PT Time Calculation (min): 27 min   Charges:   PT Evaluation $Initial PT Evaluation Tier I: 1 Procedure PT Treatments $Gait Training: 8-22 mins   PT G CodesCamille Bal [place], [place]   Ellouise Newer 05/13/2013, 11:00 AM

## 2013-05-13 NOTE — Discharge Instructions (Signed)
Follow hip precautions  Bear weight as tolerated

## 2013-05-15 ENCOUNTER — Encounter (HOSPITAL_COMMUNITY): Payer: Self-pay | Admitting: Orthopedic Surgery

## 2013-06-04 ENCOUNTER — Other Ambulatory Visit: Payer: Self-pay | Admitting: Family Medicine

## 2013-06-04 DIAGNOSIS — Z0000 Encounter for general adult medical examination without abnormal findings: Secondary | ICD-10-CM

## 2013-06-04 DIAGNOSIS — K602 Anal fissure, unspecified: Secondary | ICD-10-CM

## 2013-06-04 MED ORDER — HYDROCORTISONE 2.5 % RE CREA: 1.0000 "application " | TOPICAL_CREAM | Freq: Two times a day (BID) | RECTAL | Status: DC

## 2013-06-09 ENCOUNTER — Encounter: Payer: Self-pay | Admitting: Internal Medicine

## 2013-06-20 ENCOUNTER — Encounter (HOSPITAL_COMMUNITY): Payer: Self-pay | Admitting: Emergency Medicine

## 2013-06-20 ENCOUNTER — Emergency Department (HOSPITAL_COMMUNITY): Payer: Medicaid Other

## 2013-06-20 ENCOUNTER — Emergency Department (HOSPITAL_COMMUNITY)
Admission: EM | Admit: 2013-06-20 | Discharge: 2013-06-20 | Disposition: A | Discharge status: Home / Self Care | Payer: Medicaid Other | Attending: Emergency Medicine | Admitting: Emergency Medicine

## 2013-06-20 DIAGNOSIS — Y9389 Activity, other specified: Secondary | ICD-10-CM | POA: Insufficient documentation

## 2013-06-20 DIAGNOSIS — X500XXA Overexertion from strenuous movement or load, initial encounter: Secondary | ICD-10-CM | POA: Insufficient documentation

## 2013-06-20 DIAGNOSIS — S79929A Unspecified injury of unspecified thigh, initial encounter: Principal | ICD-10-CM

## 2013-06-20 DIAGNOSIS — Z8679 Personal history of other diseases of the circulatory system: Secondary | ICD-10-CM | POA: Insufficient documentation

## 2013-06-20 DIAGNOSIS — J45909 Unspecified asthma, uncomplicated: Secondary | ICD-10-CM | POA: Insufficient documentation

## 2013-06-20 DIAGNOSIS — Z872 Personal history of diseases of the skin and subcutaneous tissue: Secondary | ICD-10-CM | POA: Insufficient documentation

## 2013-06-20 DIAGNOSIS — M129 Arthropathy, unspecified: Secondary | ICD-10-CM | POA: Insufficient documentation

## 2013-06-20 DIAGNOSIS — Y929 Unspecified place or not applicable: Secondary | ICD-10-CM | POA: Insufficient documentation

## 2013-06-20 DIAGNOSIS — S79919A Unspecified injury of unspecified hip, initial encounter: Secondary | ICD-10-CM | POA: Insufficient documentation

## 2013-06-20 DIAGNOSIS — M25552 Pain in left hip: Secondary | ICD-10-CM

## 2013-06-20 DIAGNOSIS — Z96649 Presence of unspecified artificial hip joint: Secondary | ICD-10-CM | POA: Insufficient documentation

## 2013-06-20 DIAGNOSIS — Z880 Allergy status to penicillin: Secondary | ICD-10-CM | POA: Insufficient documentation

## 2013-06-20 MED ORDER — OXYCODONE-ACETAMINOPHEN 5-325 MG PO TABS: 1.0000 | ORAL_TABLET | ORAL | Status: DC | PRN

## 2013-06-20 MED ORDER — OXYCODONE-ACETAMINOPHEN 5-325 MG PO TABS
1.0000 | ORAL_TABLET | Freq: Once | ORAL | Status: AC
  Administered 2013-06-20: 1 via ORAL
  Filled 2013-06-20: qty 1

## 2013-06-20 MED ORDER — FENTANYL CITRATE 0.05 MG/ML IJ SOLN
50.0000 ug | Freq: Once | INTRAMUSCULAR | Status: AC
  Administered 2013-06-20: 50 ug via INTRAVENOUS
  Filled 2013-06-20: qty 2

## 2013-06-20 NOTE — ED Provider Notes (Signed)
CSN: [id number]     Arrival date & time 06/20/13  0522 History   First MD Initiated Contact with Patient 06/20/13 [phone]     Chief Complaint  Patient presents with  . Hip Injury     (Consider location/radiation/quality/duration/timing/severity/associated sxs/prior Treatment) The history is provided by the patient and medical records.   This is a 53 y.o. M with PMH significant for arthritis, osteoporosis, asthma, presenting to the ED for left hip pain.  Pt recently underwent his second left THA in May 2015 by Dr. Margarita Ranaimothy Murphy.  Pt states he has just finished his post-op PT and has been doing well.  States tonight he was laying in his recliner where he has been sleeping for the past several weeks when he moved his left leg and felt a "pop" in his left hip with immediate onset of pain.  Pt states he attempted to get up and walk but was unable due to severe pain.  No recent falls, injury, or other trauma to the hip.  States he occasionally uses a cane to assist him while ambulating.  Past Medical History  Diagnosis Date  . Allergy   . Arthritis   . Osteoporosis   . Frequent urination at night   . Itching     all over body itching at bedtime  . Asthma   . Headache(784.0)   . Anginal pain     CP for 15-35 seconds X 2 04/2013   Past Surgical History  Procedure Laterality Date  . Appendectomy    . Total hip arthroplasty Right 10/22/2012    Procedure: RIGHT TOTAL HIP ARTHROPLASTY ANTERIOR APPROACH;  Surgeon: Sheral Apleyimothy D Murphy, MD;  Location: [facility] OR;  Service: Orthopedics;  Laterality: Right;  . Total hip arthroplasty Left 05/12/2013    DR MURPHY  . Total hip arthroplasty Left 05/12/2013    Procedure: LEFT TOTAL HIP ARTHROPLASTY ANTERIOR APPROACH;  Surgeon: Sheral Apleyimothy D Murphy, MD;  Location: [facility] OR;  Service: Orthopedics;  Laterality: Left;   Family History  Problem Relation Age of Onset  . Diabetes Mother   . Diabetes Father   . Hypertension Father    History  Substance Use Topics  .  Smoking status: Never Smoker   . Smokeless tobacco: Never Used  . Alcohol Use: 0.6 oz/week    1 Cans of beer per week     Comment: daily  BEER  LIQUOR OCC    Review of Systems  Musculoskeletal: Positive for arthralgias.  All other systems reviewed and are negative.     Allergies  Penicillins  Home Medications   Prior to Admission medications   Medication Sig Start Date End Date Taking? Authorizing Provider  oxyCODONE (ROXICODONE) 5 MG immediate release tablet Take 2 tablets (10 mg total) by mouth every 4 (four) hours as needed for severe pain. 05/13/13  Yes Sheral Apleyimothy D Murphy, MD   BP 106/65  Pulse 70  Temp(Src) 98 F (36.7 C) (Oral)  Ht 5\' 9"  (1.753 m)  Wt 202 lb (91.627 kg)  BMI 29.82 kg/m2  SpO2 99%  Physical Exam  Nursing note and vitals reviewed. Constitutional: He is oriented to person, place, and time. He appears well-developed and well-nourished. No distress.  HENT:  Head: Normocephalic and atraumatic.  Mouth/Throat: Oropharynx is clear and moist.  Eyes: Conjunctivae and EOM are normal. Pupils are equal, round, and reactive to light.  Neck: Normal range of motion. Neck supple.  Cardiovascular: Normal rate, regular rhythm and normal heart sounds.   Pulmonary/Chest: Effort  normal and breath sounds normal. No respiratory distress. He has no wheezes.  Musculoskeletal: Normal range of motion.       Left hip: Normal. He exhibits normal range of motion, no tenderness, no bony tenderness and no deformity.  Left hip with well healed THA surgical incision; no gross deformity noted; full ROM of hip maintained without difficulty but some pain noted; DP pulse intact; sensation intact diffusely throughout LLE  Neurological: He is alert and oriented to person, place, and time.  Skin: Skin is warm and dry. He is not diaphoretic.  Psychiatric: He has a normal mood and affect.    ED Course  Procedures (including critical care time) Labs Review Labs Reviewed - No data to  display  Imaging Review Dg Hip Complete Left  06/20/2013   CLINICAL DATA:  Left hip pain.  EXAM: LEFT HIP - COMPLETE 2+ VIEW  COMPARISON:  Left hip radiographs from 03/26/2010  FINDINGS: The patient's bilateral hip prostheses appear grossly intact, without evidence of loosening or fracture. The sacroiliac joints are grossly unremarkable in appearance. The pubic rami appear intact.  No significant degenerative change is seen. The visualized bowel gas pattern is grossly unremarkable. No significant soft tissue abnormalities are characterized on radiograph.  IMPRESSION: Bilateral hip prostheses appear grossly intact, without evidence of loosening or fracture.   Electronically Signed   By: Roanna RaiderJeffery  Chang M.D.   On: 06/20/2013 06:53     EKG Interpretation None      MDM   Final diagnoses:  Hip pain, left   X-ray negative for acute hardware loosening, fracture, or dislocation. On exam, patient has no gross deformities, his incision is well healed and his leg remains neurovascularly intact. In the ED his pain is controlled and he was able to get out of bed and ambulate unassisted up and down the hallway without difficulty.  He will be discharged home with percocet and recommended to use his cane for the next few days until pain fully resolves.  He will FU with Dr. Eulah PontMurphy.  Discussed plan with patient, he/she acknowledged understanding and agreed with plan of care.  Return precautions given for new or worsening symptoms.  Garlon HatchetLisa M Fate Galanti, PA-C 06/20/13 [phone]

## 2013-06-20 NOTE — ED Provider Notes (Signed)
Medical screening examination/treatment/procedure(s) were performed by non-physician practitioner and as supervising physician I was immediately available for consultation/collaboration.   EKG Interpretation None       Koji Niehoff M Phenix Grein, MD 06/20/13 2113 

## 2013-06-20 NOTE — Discharge Instructions (Signed)
Take the prescribed medication as directed.  Use can to help ambulate until pain starts to improve. Follow-up with Dr. Eulah PontMurphy if having continued problems with hip. Return to the ED for new or worsening symptoms.

## 2013-06-20 NOTE — ED Notes (Signed)
Per EMS pt had his second left hip replacement in May, pt had his first Sept 2014. Pt reports he was laying in bed tonight when he moved and heard a pop, pt reports pain started instantly in his left hip after that. Pt reports he had just completed physical therapy post 2nd replacement. Pt is A&O X4.

## 2013-06-20 NOTE — ED Notes (Signed)
PA at bedside.

## 2013-06-21 ENCOUNTER — Other Ambulatory Visit: Payer: Self-pay | Admitting: Family Medicine

## 2013-06-21 DIAGNOSIS — R600 Localized edema: Secondary | ICD-10-CM

## 2013-06-21 MED ORDER — FUROSEMIDE 20 MG PO TABS: 20.0000 mg | ORAL_TABLET | Freq: Every day | ORAL | Status: DC

## 2013-07-23 ENCOUNTER — Encounter: Payer: Self-pay | Admitting: Gastroenterology

## 2013-07-27 ENCOUNTER — Emergency Department (HOSPITAL_COMMUNITY)
Admission: EM | Admit: 2013-07-27 | Discharge: 2013-07-27 | Disposition: A | Discharge status: Home / Self Care | Payer: Medicaid Other | Attending: Emergency Medicine | Admitting: Emergency Medicine

## 2013-07-27 ENCOUNTER — Encounter (HOSPITAL_COMMUNITY): Payer: Self-pay | Admitting: Emergency Medicine

## 2013-07-27 DIAGNOSIS — M25552 Pain in left hip: Secondary | ICD-10-CM

## 2013-07-27 DIAGNOSIS — Z8739 Personal history of other diseases of the musculoskeletal system and connective tissue: Secondary | ICD-10-CM | POA: Diagnosis not present

## 2013-07-27 DIAGNOSIS — M25559 Pain in unspecified hip: Secondary | ICD-10-CM | POA: Insufficient documentation

## 2013-07-27 DIAGNOSIS — J45909 Unspecified asthma, uncomplicated: Secondary | ICD-10-CM | POA: Diagnosis not present

## 2013-07-27 DIAGNOSIS — Z872 Personal history of diseases of the skin and subcutaneous tissue: Secondary | ICD-10-CM | POA: Insufficient documentation

## 2013-07-27 DIAGNOSIS — Z8679 Personal history of other diseases of the circulatory system: Secondary | ICD-10-CM | POA: Diagnosis not present

## 2013-07-27 DIAGNOSIS — Z880 Allergy status to penicillin: Secondary | ICD-10-CM | POA: Diagnosis not present

## 2013-07-27 DIAGNOSIS — Z9889 Other specified postprocedural states: Secondary | ICD-10-CM | POA: Insufficient documentation

## 2013-07-27 DIAGNOSIS — G8929 Other chronic pain: Secondary | ICD-10-CM | POA: Diagnosis not present

## 2013-07-27 DIAGNOSIS — Z8719 Personal history of other diseases of the digestive system: Secondary | ICD-10-CM | POA: Insufficient documentation

## 2013-07-27 DIAGNOSIS — Z79899 Other long term (current) drug therapy: Secondary | ICD-10-CM | POA: Insufficient documentation

## 2013-07-27 MED ORDER — OXYCODONE-ACETAMINOPHEN 5-325 MG PO TABS
2.0000 | ORAL_TABLET | Freq: Once | ORAL | Status: AC
  Administered 2013-07-27: 2 via ORAL
  Filled 2013-07-27: qty 2

## 2013-07-27 NOTE — ED Provider Notes (Signed)
CSN: [id number]     Arrival date & time 07/27/13  1249 History   First MD Initiated Contact with Patient 07/27/13 1414     This chart was scribed for non-physician practitioner, Elpidio Anis PA-C, working with Shanna Cisco, MD by Arlan Organ, ED Scribe. This patient was seen in room TR08C/TR08C and the patient's care was started at 3:31 PM.   Chief Complaint  Patient presents with  . Hip Pain   Patient is a 53 y.o. male presenting with hip pain. The history is provided by the patient. No language interpreter was used.  Hip Pain This is a chronic problem. The problem occurs constantly. The problem has not changed since onset.Nothing aggravates the symptoms. Nothing relieves the symptoms. He has tried nothing for the symptoms.    HPI Comments: [name] Pope is a 53 y.o. male who presents to the Emergency Department complaining of constant, moderate bilateral hip pain that is chronic in nature. He describes this pain as sharp. Pt states he had a recent hip replacement in May 2015. This pain is exacerbated with standing and walking. No alleviating factors at this time. Pt has tried prescribed Percocet, ice, and heat application without any improvement for symptoms. At this time he denies any fever or chills. No numbness, loss of sensation, paresthesia, or weakness. Pt with known allergy to Penicillin. No other concerns this visit.  Past Medical History  Diagnosis Date  . Allergy   . Arthritis   . Osteoporosis   . Frequent urination at night   . Itching     all over body itching at bedtime  . Asthma   . Headache(784.0)   . Anginal pain     CP for 15-35 seconds X 2 04/2013  . Appendicitis   . Lumbar radiculopathy    Past Surgical History  Procedure Laterality Date  . Appendectomy    . Total hip arthroplasty Right 10/22/2012    Procedure: RIGHT TOTAL HIP ARTHROPLASTY ANTERIOR APPROACH;  Surgeon: Sheral Apley, MD;  Location: [facility] OR;  Service: Orthopedics;  Laterality: Right;  .  Total hip arthroplasty Left 05/12/2013    DR MURPHY  . Total hip arthroplasty Left 05/12/2013    Procedure: LEFT TOTAL HIP ARTHROPLASTY ANTERIOR APPROACH;  Surgeon: Sheral Apley, MD;  Location: [facility] OR;  Service: Orthopedics;  Laterality: Left;   Family History  Problem Relation Age of Onset  . Diabetes Mother   . Diabetes Father   . Hypertension Father    History  Substance Use Topics  . Smoking status: Never Smoker   . Smokeless tobacco: Never Used  . Alcohol Use: 0.6 oz/week    1 Cans of beer per week     Comment: daily  BEER  LIQUOR OCC    Review of Systems  Constitutional: Negative for fever and chills.  Musculoskeletal: Positive for arthralgias (Bilateral hip).  Skin: Negative for rash.  Neurological: Negative for weakness and numbness.  Psychiatric/Behavioral: Negative for confusion.      Allergies  Penicillins  Home Medications   Prior to Admission medications   Medication Sig Start Date End Date Taking? Authorizing Provider  furosemide (LASIX) 20 MG tablet Take 1 tablet (20 mg total) by mouth daily. 06/21/13   [name], MD  oxyCODONE (ROXICODONE) 5 MG immediate release tablet Take 2 tablets (10 mg total) by mouth every 4 (four) hours as needed for severe pain. 05/13/13   Sheral Apley, MD  oxyCODONE-acetaminophen (PERCOCET/ROXICET) 5-325 MG per tablet Take 1  tablet by mouth every 4 (four) hours as needed. 06/20/13   Garlon HatchetLisa M Sanders, PA-C   Triage Vitals: BP 138/81  Pulse 87  Temp(Src) 98.4 F (36.9 C) (Oral)  Resp 18  Ht 5\' 9"  (1.753 m)  Wt 206 lb (93.441 kg)  BMI 30.41 kg/m2  SpO2 99%   Physical Exam  Nursing note and vitals reviewed. Constitutional: He is oriented to person, place, and time. He appears well-developed and well-nourished.  HENT:  Head: Normocephalic.  Eyes: EOM are normal.  Neck: Normal range of motion.  Cardiovascular: Intact distal pulses.   Pulmonary/Chest: Effort normal.  Abdominal: He exhibits no distension.   Musculoskeletal: Normal range of motion.  Right hip tenderness without redness, swelling or discoloration.  Neurological: He is alert and oriented to person, place, and time.  Psychiatric: He has a normal mood and affect.    ED Course  Procedures (including critical care time)  DIAGNOSTIC STUDIES: Oxygen Saturation is 99% on RA, Normal by my interpretation.    COORDINATION OF CARE: 3:30 PM-Discussed treatment plan with pt at bedside and pt agreed to plan.     Labs Review Labs Reviewed - No data to display  Imaging Review No results found.   EKG Interpretation None      MDM   Final diagnoses:  None    1. Chronic hip pain  He reports his doctor recently gave him hydrocodone which is not working. Discussed that as he received #60 hydrocodone per data base review, I would not prescribe further medications through the ED. He was given Percocet in the department for pain and encouraged to follow up with Dr. Eulah PontMurphy in his office.  I personally performed the services described in this documentation, which was scribed in my presence. The recorded information has been reviewed and is accurate.    Arnoldo HookerShari A Analiz Tvedt, PA-C 07/29/13 1531

## 2013-07-27 NOTE — Discharge Instructions (Signed)
Hip Pain °The hips join the upper legs to the lower pelvis. The bones, cartilage, tendons, and muscles of the hip joint perform a lot of work each day holding your body weight and allowing you to move around. °Hip pain is a common symptom. It can range from a minor ache to severe pain on 1 or both hips. Pain may be felt on the inside of the hip joint near the groin, or the outside near the buttocks and upper thigh. There may be swelling or stiffness as well. It occurs more often when a person walks or performs activity. There are many reasons hip pain can develop. °CAUSES  °It is important to work with your caregiver to identify the cause since many conditions can impact the bones, cartilage, muscles, and tendons of the hips. Causes for hip pain include: °· Broken (fractured) bones. °· Separation of the thighbone from the hip socket (dislocation). °· Torn cartilage of the hip joint. °· Swelling (inflammation) of a tendon (tendonitis), the sac within the hip joint (bursitis), or a joint. °· A weakening in the abdominal wall (hernia), affecting the nerves to the hip. °· Arthritis in the hip joint or lining of the hip joint. °· Pinched nerves in the back, hip, or upper thigh. °· A bulging disc in the spine (herniated disc). °· Rarely, bone infection or cancer. °DIAGNOSIS  °The location of your hip pain will help your caregiver understand what may be causing the pain. A diagnosis is based on your medical history, your symptoms, results from your physical exam, and results from diagnostic tests. Diagnostic tests may include X-ray exams, a computerized magnetic scan (magnetic resonance imaging, MRI), or bone scan. °TREATMENT  °Treatment will depend on the cause of your hip pain. Treatment may include: °· Limiting activities and resting until symptoms improve. °· Crutches or other walking supports (a cane or brace). °· Ice, elevation, and compression. °· Physical therapy or home exercises. °· Shoe inserts or special  shoes. °· Losing weight. °· Medications to reduce pain. °· Undergoing surgery. °HOME CARE INSTRUCTIONS  °· Only take over-the-counter or prescription medicines for pain, discomfort, or fever as directed by your caregiver. °· Put ice on the injured area: °¨ Put ice in a plastic bag. °¨ Place a towel between your skin and the bag. °¨ Leave the ice on for 15-20 minutes at a time, 03-04 times a day. °· Keep your leg raised (elevated) when possible to lessen swelling. °· Avoid activities that cause pain. °· Follow specific exercises as directed by your caregiver. °· Sleep with a pillow between your legs on your most comfortable side. °· Record how often you have hip pain, the location of the pain, and what it feels like. This information may be helpful to you and your caregiver. °· Ask your caregiver about returning to work or sports and whether you should drive. °· Follow up with your caregiver for further exams, therapy, or testing as directed. °SEEK MEDICAL CARE IF:  °· Your pain or swelling continues or worsens after 1 week. °· You are feeling unwell or have chills. °· You have increasing difficulty with walking. °· You have a loss of sensation or other new symptoms. °· You have questions or concerns. °SEEK IMMEDIATE MEDICAL CARE IF:  °· You cannot put weight on the affected hip. °· You have fallen. °· You have a sudden increase in pain and swelling in your hip. °· You have a fever. °MAKE SURE YOU:  °· Understand these instructions. °·   Will watch your condition. °· Will get help right away if you are not doing well or get worse. °Document Released: 06/14/2009 Document Revised: 03/19/2011 Document Reviewed: 06/14/2009 °ExitCare® Patient Information ©2015 [employer]. This information is not intended to replace advice given to you by your health care provider. Make sure you discuss any questions you have with your health care provider. ° °

## 2013-07-27 NOTE — ED Notes (Signed)
Pt here for chronic hip pain sts that the pain medication is not working.

## 2013-07-30 NOTE — ED Provider Notes (Signed)
Medical screening examination/treatment/procedure(s) were performed by non-physician practitioner and as supervising physician I was immediately available for consultation/collaboration.   Shanna CiscoMegan E Ayleen Mckinstry, MD 07/30/13 [phone]

## 2013-08-10 ENCOUNTER — Ambulatory Visit (INDEPENDENT_AMBULATORY_CARE_PROVIDER_SITE_OTHER): Payer: Medicaid Other | Admitting: Internal Medicine

## 2013-08-10 ENCOUNTER — Encounter: Payer: Self-pay | Admitting: Internal Medicine

## 2013-08-10 VITALS — BP 110/68 | HR 72 | Ht 69.0 in | Wt 207.4 lb

## 2013-08-10 DIAGNOSIS — K648 Other hemorrhoids: Secondary | ICD-10-CM

## 2013-08-10 DIAGNOSIS — Z1211 Encounter for screening for malignant neoplasm of colon: Secondary | ICD-10-CM

## 2013-08-10 MED ORDER — POLYETHYLENE GLYCOL 3350 17 GM/SCOOP PO POWD: 1.0000 | Freq: Every day | ORAL | Status: DC

## 2013-08-10 NOTE — Progress Notes (Signed)
[facility]  [name] FeilWilliam A Wesolowski    [id number]    December 15, 1960    Assessment and Plan/Recommendations: Hemorrhoids: Symptoms and response to treatment consistent with bleeding hemorrhoids. Pt may benefit from banding in the future.  Will further evaluate during screening colonoscopy. Colonoscopy for screening: Has no past hx of colonoscopy. The risks and benefits as well as alternatives of endoscopic procedure(s) have been discussed and reviewed. All questions answered. The patient agrees to proceed.   HPI: [name] Pope is a 53 y.o. AA male who in May of 2015 had several episodes of painless hematochezia. [facility] similar incident.  He states during these two episodes he had a small protrusion from his anus when wiping, that would spontaneously reduce once he was done passing stools.  He describes the events as having gross blood in the toilet, and not just streaking on the toilet paper.  Pt used preparation H with relief in the most recent episode.  Today pt denies fever, N/V, weight loss, diarrhea, constipation, hematochezia, or melana. He states he did have diarrhea during those two episodes.     Allergies as of 08/10/2013 - Review Complete 08/10/2013  Allergen Reaction Noted  . Penicillins  05/12/2012    History   Social History  . Marital Status: Single    Spouse Name: N/A    Number of Children: 5  . Years of Education: N/A   Occupational History  . disabled    Social History Main Topics  . Smoking status: Never Smoker   . Smokeless tobacco: Never Used  . Alcohol Use: 0.6 oz/week    1 Cans of beer per week     Comment: daily  BEER  LIQUOR OCC   Review of systems: All other ROS negative or as per HPI  Physical Exam: BP 110/68  Pulse 72  Ht 5\' 9"  (1.753 m)  Wt 207 lb 6 oz (94.065 kg)  BMI 30.61 kg/m2 Constitutional: WDWN NAD Eyes: anicteric Mouth: oral and posterior pharynx free of lesions Neck: supple, no mass or  thyromegaly Lungs: clear to auscultation bilaterally Cardiovascular: S1S2 with regular rate and rhythm, no rubs murmurs or gallops Abdomen: soft, nontender, nondistended, no masses or organomegaly, normal bowel sounds Extremities: no lower extremity edema  Skin: no rash Neuro: alert and oriented x 3 Psych: normal mood and affect  Data Reviewed: [facility] including [facility] visit and pertinent labs.  Casimiro NeedleMichael A. Tretha Sciaraillery,  [place] PineUniversity, [place]PA Student 08/10/2013 3:47 PM    I have personally seen the patient, reviewed and repeated key elements of the history and physical and participated in formation of the assessment and plan the student has documented.  Iva Booparl E. Gessner, MD, Clementeen GrahamFACG

## 2013-08-10 NOTE — Patient Instructions (Addendum)
You have been scheduled for a colonoscopy. Please follow written instructions given to you at your visit today.  Please pick up your prep supplies at the pharmacy.  Miralax sent in.   If you use inhalers (even only as needed), please bring them with you on the day of your procedure. Your physician has requested that you go to www.startemmi.com and enter the access code given to you at your visit today. This web site gives a general overview about your procedure. However, you should still follow specific instructions given to you by our office regarding your preparation for the procedure.    I appreciate the opportunity to care for you.

## 2013-09-08 ENCOUNTER — Telehealth: Payer: Self-pay | Admitting: *Deleted

## 2013-09-08 DIAGNOSIS — M25551 Pain in right hip: Secondary | ICD-10-CM

## 2013-09-08 NOTE — Telephone Encounter (Signed)
Orthopedics office called and stated that pt needs a referral to [facility].  You should have received notes from them about this pt

## 2013-10-15 ENCOUNTER — Telehealth: Payer: Self-pay | Admitting: Internal Medicine

## 2013-10-15 NOTE — Telephone Encounter (Signed)
no

## 2013-10-16 ENCOUNTER — Encounter: Payer: Medicaid Other | Admitting: Internal Medicine

## 2013-10-28 ENCOUNTER — Encounter: Payer: Self-pay | Admitting: Physical Medicine & Rehabilitation

## 2013-12-07 ENCOUNTER — Ambulatory Visit: Payer: Medicaid Other | Admitting: Physical Medicine & Rehabilitation

## 2013-12-08 ENCOUNTER — Ambulatory Visit: Payer: Medicaid Other | Admitting: Sports Medicine

## 2014-04-13 ENCOUNTER — Other Ambulatory Visit: Payer: Self-pay | Admitting: Family Medicine

## 2014-04-13 DIAGNOSIS — N529 Male erectile dysfunction, unspecified: Secondary | ICD-10-CM

## 2014-04-13 MED ORDER — TADALAFIL 5 MG PO TABS: 5.0000 mg | ORAL_TABLET | Freq: Every day | ORAL | Status: DC | PRN

## 2014-05-23 ENCOUNTER — Emergency Department (HOSPITAL_COMMUNITY)
Admission: EM | Admit: 2014-05-23 | Discharge: 2014-05-23 | Disposition: A | Discharge status: Home / Self Care | Payer: Medicaid Other | Source: Home / Self Care | Attending: Emergency Medicine | Admitting: Emergency Medicine

## 2014-05-23 ENCOUNTER — Encounter (HOSPITAL_COMMUNITY): Payer: Self-pay | Admitting: Emergency Medicine

## 2014-05-23 DIAGNOSIS — J40 Bronchitis, not specified as acute or chronic: Secondary | ICD-10-CM | POA: Diagnosis not present

## 2014-05-23 MED ORDER — AZITHROMYCIN 250 MG PO TABS: ORAL_TABLET | ORAL | Status: DC

## 2014-05-23 MED ORDER — PREDNISONE 50 MG PO TABS: ORAL_TABLET | ORAL | Status: DC

## 2014-05-23 MED ORDER — HYDROCODONE-HOMATROPINE 5-1.5 MG/5ML PO SYRP: 5.0000 mL | ORAL_SOLUTION | Freq: Four times a day (QID) | ORAL | Status: DC | PRN

## 2014-05-23 MED ORDER — ALBUTEROL SULFATE HFA 108 (90 BASE) MCG/ACT IN AERS: 2.0000 | INHALATION_SPRAY | RESPIRATORY_TRACT | Status: DC | PRN

## 2014-05-23 NOTE — ED Notes (Signed)
Patient c/o productive cough with thick sputum x 6 days. Patient reports he has a post nasal drainage. Patient denies fever or chills. Has had headaches after coughing. Denies SOB.

## 2014-05-23 NOTE — Discharge Instructions (Signed)
You have bronchitis. Take prednisone and azithromycin as prescribed.  These are the most important medications. Use the albuterol inhaler every 4 hours as needed for wheezing. Use Hycodan cough syrup every 4 hours as needed. Do not drive while taking this medicine. You can also makes honey with a little water or lemon juice and use that to help suppress the cough. Follow-up as needed.

## 2014-05-23 NOTE — ED Provider Notes (Signed)
CSN: [id number]     Arrival date & time 05/23/14  1246 History   First MD Initiated Contact with Patient 05/23/14 1349     Chief Complaint  Patient presents with  . URI   (Consider location/radiation/quality/duration/timing/severity/associated sxs/prior Treatment) HPI He is a 54 year old man here for evaluation of cough. His symptoms started about 8 days ago. He describes postnasal drip with some mild rhinorrhea. His biggest complaint is cough. Cough is productive of thick sputum. It keeps him awake at night. He also reports some intermittent wheezing. He denies any fevers or chills. No nausea or vomiting. No diarrhea. No shortness of breath or chest pain. He has tried multiple over-the-counter medications with minimal improvement. He is taking Allegra, which does seem to help with the runny nose little.  Past Medical History  Diagnosis Date  . Allergy   . Arthritis   . Osteoporosis   . Frequent urination at night   . Itching     all over body itching at bedtime  . Asthma   . Headache(784.0)   . Anginal pain     CP for 15-35 seconds X 2 04/2013  . Appendicitis   . Lumbar radiculopathy    Past Surgical History  Procedure Laterality Date  . Appendectomy    . Total hip arthroplasty Right 10/22/2012    Procedure: RIGHT TOTAL HIP ARTHROPLASTY ANTERIOR APPROACH;  Surgeon: Sheral Apleyimothy D Murphy, MD;  Location: [facility] OR;  Service: Orthopedics;  Laterality: Right;  . Total hip arthroplasty Left 05/12/2013    DR MURPHY  . Total hip arthroplasty Left 05/12/2013    Procedure: LEFT TOTAL HIP ARTHROPLASTY ANTERIOR APPROACH;  Surgeon: Sheral Apleyimothy D Murphy, MD;  Location: [facility] OR;  Service: Orthopedics;  Laterality: Left;   Family History  Problem Relation Age of Onset  . Diabetes Mother   . Diabetes Father   . Hypertension Father    History  Substance Use Topics  . Smoking status: Never Smoker   . Smokeless tobacco: Never Used  . Alcohol Use: 0.6 oz/week    1 Cans of beer per week     Comment: daily   BEER  LIQUOR OCC    Review of Systems As in history of present illness Allergies  Penicillins  Home Medications   Prior to Admission medications   Medication Sig Start Date End Date Taking? Authorizing Provider  albuterol (PROVENTIL HFA;VENTOLIN HFA) 108 (90 BASE) MCG/ACT inhaler Inhale 2 puffs into the lungs every 4 (four) hours as needed for wheezing or shortness of breath. 05/23/14   Charm RingsErin J Garrie Woodin, MD  azithromycin (ZITHROMAX Z-PAK) 250 MG tablet Take 2 pills today, then 1 pill daily until gone. 05/23/14   Charm RingsErin J Ahnya Akre, MD  furosemide (LASIX) 20 MG tablet Take 20 mg by mouth daily as needed for fluid.    Historical Provider, MD  HYDROcodone-acetaminophen (NORCO/VICODIN) 5-325 MG per tablet Take 1-3 tablets by mouth every 6 (six) hours as needed for moderate pain.    Historical Provider, MD  HYDROcodone-homatropine (HYCODAN) 5-1.5 MG/5ML syrup Take 5 mLs by mouth every 6 (six) hours as needed for cough. 05/23/14   Charm RingsErin J Cambryn Charters, MD  polyethylene glycol powder (GLYCOLAX/MIRALAX) powder Take 255 g (1 Container total) by mouth daily. 08/10/13   Iva Booparl E Gessner, MD  predniSONE (DELTASONE) 50 MG tablet Take 1 pill daily for 5 days. 05/23/14   Charm RingsErin J Marnell Mcdaniel, MD  tadalafil (CIALIS) 5 MG tablet Take 1 tablet (5 mg total) by mouth daily as needed for erectile dysfunction.  04/13/14   Elvina SidleKurt Lauenstein, MD   BP 104/58 mmHg  Pulse 77  Temp(Src) 98 F (36.7 C) (Oral)  Resp 16  SpO2 97% Physical Exam  Constitutional: He is oriented to person, place, and time. He appears well-developed and well-nourished. No distress.  HENT:  Nose: Nose normal.  Mouth/Throat: Oropharynx is clear and moist. No oropharyngeal exudate.  Cardiovascular: Normal rate, regular rhythm and normal heart sounds.   No murmur heard. Pulmonary/Chest: Effort normal and breath sounds normal. No respiratory distress. He has no wheezes. He has no rales.  Neurological: He is alert and oriented to person, place, and time.    ED Course   Procedures (including critical care time) Labs Review Labs Reviewed - No data to display  Imaging Review No results found.   MDM   1. Bronchitis    We'll treat with prednisone and azithromycin. Hycodan and albuterol as needed. Follow-up as needed.    Charm RingsErin J Selinda Korzeniewski, MD 05/23/14 [phone]

## 2014-05-29 ENCOUNTER — Emergency Department (INDEPENDENT_AMBULATORY_CARE_PROVIDER_SITE_OTHER)
Admission: EM | Admit: 2014-05-29 | Discharge: 2014-05-29 | Disposition: A | Discharge status: Home / Self Care | Payer: Medicaid Other | Source: Home / Self Care | Attending: Family Medicine | Admitting: Family Medicine

## 2014-05-29 ENCOUNTER — Encounter (HOSPITAL_COMMUNITY): Payer: Self-pay | Admitting: *Deleted

## 2014-05-29 DIAGNOSIS — M549 Dorsalgia, unspecified: Secondary | ICD-10-CM | POA: Diagnosis not present

## 2014-05-29 DIAGNOSIS — G8929 Other chronic pain: Secondary | ICD-10-CM

## 2014-05-29 LAB — POCT URINALYSIS DIP (DEVICE)
Bilirubin Urine: NEGATIVE
Glucose, UA: NEGATIVE mg/dL
HGB URINE DIPSTICK: NEGATIVE
KETONES UR: NEGATIVE mg/dL
LEUKOCYTES UA: NEGATIVE
Nitrite: NEGATIVE
PROTEIN: NEGATIVE mg/dL
Specific Gravity, Urine: >=1.03 (ref 1.005–1.030)
UROBILINOGEN UA: 0.2 mg/dL (ref 0.0–1.0)
pH: 6 (ref 5.0–8.0)

## 2014-05-29 MED ORDER — BACLOFEN 10 MG PO TABS: 10.0000 mg | ORAL_TABLET | Freq: Three times a day (TID) | ORAL | Status: DC

## 2014-05-29 NOTE — Discharge Instructions (Signed)
Thank you for coming in today. Come back or go to the emergency room if you notice new weakness new numbness problems walking or bowel or bladder problems. Use baclofen instead of the other muscle relaxer.  Follow up with your pain doctor.   Chronic Back Pain  When back pain lasts longer than 3 months, it is called chronic back pain.People with chronic back pain often go through certain periods that are more intense (flare-ups).  CAUSES Chronic back pain can be caused by wear and tear (degeneration) on different structures in your back. These structures include:  The bones of your spine (vertebrae) and the joints surrounding your spinal cord and nerve roots (facets).  The strong, fibrous tissues that connect your vertebrae (ligaments). Degeneration of these structures may result in pressure on your nerves. This can lead to constant pain. HOME CARE INSTRUCTIONS  Avoid bending, heavy lifting, prolonged sitting, and activities which make the problem worse.  Take brief periods of rest throughout the day to reduce your pain. Lying down or standing usually is better than sitting while you are resting.  Take over-the-counter or prescription medicines only as directed by your caregiver. SEEK IMMEDIATE MEDICAL CARE IF:   You have weakness or numbness in one of your legs or feet.  You have trouble controlling your bladder or bowels.  You have nausea, vomiting, abdominal pain, shortness of breath, or fainting. Document Released: 02/02/2004 Document Revised: 03/19/2011 Document Reviewed: 12/09/2010 [facility] Patient Information 2015 [employer], [place]. This information is not intended to replace advice given to you by your health care provider. Make sure you discuss any questions you have with your health care provider.

## 2014-05-29 NOTE — ED Provider Notes (Signed)
[name] FeilWilliam A Pope is a 54 y.o. male who presents to Urgent Care today for back pain. Patient has acute on chronic low back pain. He has chronic back pain for which she takes oxycodone 4 times daily. Over the past 4 days he's had acute worsening of low back pain. He denies any radiating pain weakness or numbness. No bowel bladder dysfunction. He does not urinary frequency. No injury.   Past Medical History  Diagnosis Date  . Allergy   . Arthritis   . Osteoporosis   . Frequent urination at night   . Itching     all over body itching at bedtime  . Asthma   . Headache(784.0)   . Anginal pain     CP for 15-35 seconds X 2 04/2013  . Appendicitis   . Lumbar radiculopathy    Past Surgical History  Procedure Laterality Date  . Appendectomy    . Total hip arthroplasty Right 10/22/2012    Procedure: RIGHT TOTAL HIP ARTHROPLASTY ANTERIOR APPROACH;  Surgeon: Sheral Apleyimothy D Murphy, MD;  Location: [facility] OR;  Service: Orthopedics;  Laterality: Right;  . Total hip arthroplasty Left 05/12/2013    DR MURPHY  . Total hip arthroplasty Left 05/12/2013    Procedure: LEFT TOTAL HIP ARTHROPLASTY ANTERIOR APPROACH;  Surgeon: Sheral Apleyimothy D Murphy, MD;  Location: [facility] OR;  Service: Orthopedics;  Laterality: Left;   History  Substance Use Topics  . Smoking status: Never Smoker   . Smokeless tobacco: Never Used  . Alcohol Use: 0.6 oz/week    1 Cans of beer per week     Comment: daily  BEER  LIQUOR OCC   ROS as above Medications: No current facility-administered medications for this encounter.   Current Outpatient Prescriptions  Medication Sig Dispense Refill  . albuterol (PROVENTIL HFA;VENTOLIN HFA) 108 (90 BASE) MCG/ACT inhaler Inhale 2 puffs into the lungs every 4 (four) hours as needed for wheezing or shortness of breath. 1 Inhaler 0  . azithromycin (ZITHROMAX Z-PAK) 250 MG tablet Take 2 pills today, then 1 pill daily until gone. 6 tablet 0  . baclofen (LIORESAL) 10 MG tablet Take 1 tablet (10 mg total) by mouth 3  (three) times daily. 45 each 0  . furosemide (LASIX) 20 MG tablet Take 20 mg by mouth daily as needed for fluid.    Marland Kitchen. HYDROcodone-acetaminophen (NORCO/VICODIN) 5-325 MG per tablet Take 1-3 tablets by mouth every 6 (six) hours as needed for moderate pain.    Marland Kitchen. HYDROcodone-homatropine (HYCODAN) 5-1.5 MG/5ML syrup Take 5 mLs by mouth every 6 (six) hours as needed for cough. 120 mL 0  . polyethylene glycol powder (GLYCOLAX/MIRALAX) powder Take 255 g (1 Container total) by mouth daily. 238 g 3  . predniSONE (DELTASONE) 50 MG tablet Take 1 pill daily for 5 days. 5 tablet 0  . tadalafil (CIALIS) 5 MG tablet Take 1 tablet (5 mg total) by mouth daily as needed for erectile dysfunction. 30 tablet 0   Allergies  Allergen Reactions  . Penicillins     childhood     Exam:  BP 137/89 mmHg  Pulse 79  Temp(Src) 97.9 F (36.6 C) (Oral)  Resp 18  SpO2 97% Gen: Well NAD HEENT: EOMI,  MMM Lungs: Normal work of breathing. CTABL Heart: RRR no MRG Abd: NABS, Soft. Nondistended, Nontender Exts: Brisk capillary refill, warm and well perfused.  Back: Nontender to spinal midline tender palpation bilateral lumbar paraspinals decreased lumbar range of motion due to pain lower she may strength is equal and  normal throughout sensation intact throughout reflexes are diminished but equal bilateral lower extremities. Antalgic gait.  Results for orders placed or performed during the hospital encounter of 05/29/14 (from the past 24 hour(s))  POCT urinalysis dip (device)     Status: None   Collection Time: 05/29/14  4:27 PM  Result Value Ref Range   Glucose, UA NEGATIVE NEGATIVE mg/dL   Bilirubin Urine NEGATIVE NEGATIVE   Ketones, ur NEGATIVE NEGATIVE mg/dL   Specific Gravity, Urine >=1.030 1.005 - 1.030   Hgb urine dipstick NEGATIVE NEGATIVE   pH 6.0 5.0 - 8.0   Protein, ur NEGATIVE NEGATIVE mg/dL   Urobilinogen, UA 0.2 0.0 - 1.0 mg/dL   Nitrite NEGATIVE NEGATIVE   Leukocytes, UA NEGATIVE NEGATIVE   No  results found.  Assessment and Plan: 54 y.o. male with on chronic lumbar back pain. Patient has a medication use agreement with the pain management clinic. Continue oxycodone. Change muscle relaxers to baclofen. Follow-up with pain management physician.  Discussed warning signs or symptoms. Please see discharge instructions. Patient expresses understanding.     [name], MD 05/29/14 [phone]

## 2014-05-29 NOTE — ED Notes (Signed)
Pt  Reports    Chronic     Back  Pain      Sees  A  Pain  Control clinic        He  Reports  The  Pain  Became  Bad last  Pm              Pt  Also  Reports     Some      Urinary  Symptoms     As   Well

## 2014-07-31 ENCOUNTER — Other Ambulatory Visit: Payer: Self-pay | Admitting: Family Medicine

## 2014-07-31 MED ORDER — IVERMECTIN 3 MG PO TABS: 12.0000 mg | ORAL_TABLET | Freq: Once | ORAL | Status: DC

## 2014-07-31 MED ORDER — PREDNISONE 50 MG PO TABS: ORAL_TABLET | ORAL | Status: DC

## 2015-01-26 IMAGING — CR DG HIP 1V PORT*R*
1 series · 1 of 1 positions shown · non-contrast
Comparison: 11/20/2011.

CLINICAL DATA: Status post right total hip arthroplasty.

EXAM:
PORTABLE PELVIS; PORTABLE RIGHT HIP - 1 VIEW

[AP]
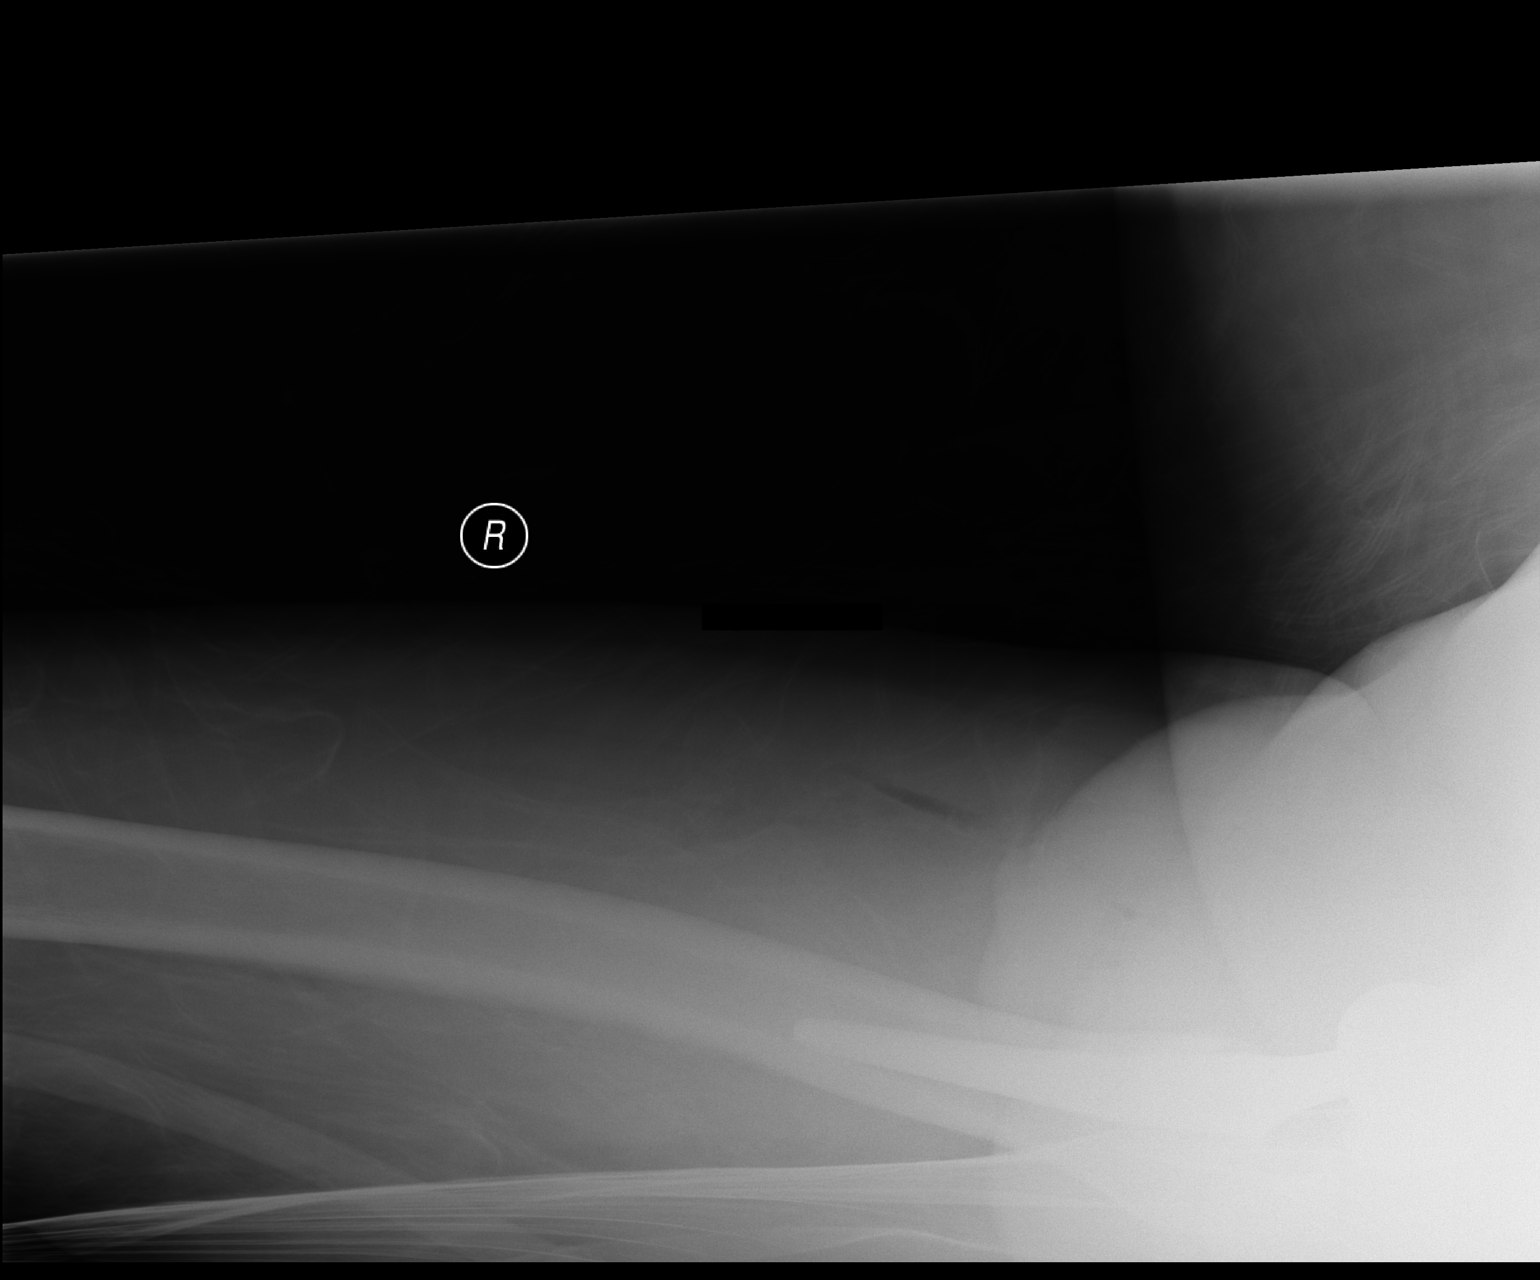

[1 of 1 positions shown; findings below may reference images not displayed]

FINDINGS: Postoperative changes of right total hip arthroplasty are now noted.
No definite periprosthetic fracture is identified. The right
prosthetic femoral head projects within the right acetabulum on this
single view examination.
IMPRESSION: 1. Postoperative changes of right total hip arthroplasty without
immediate complicating features.
.

## 2015-01-26 IMAGING — RF DG HIP OPERATIVE*R*
1 series · 2 of 2 positions shown · non-contrast
Comparison: 11/20/2011

CLINICAL DATA: Right hip replacement

EXAM:
DG OPERATIVE RIGHT HIP
TECHNIQUE: Two intraoperative spot images of the right hip is submitted.

[Series 1: run · 2 of 2 slices shown]
[im 1/2]
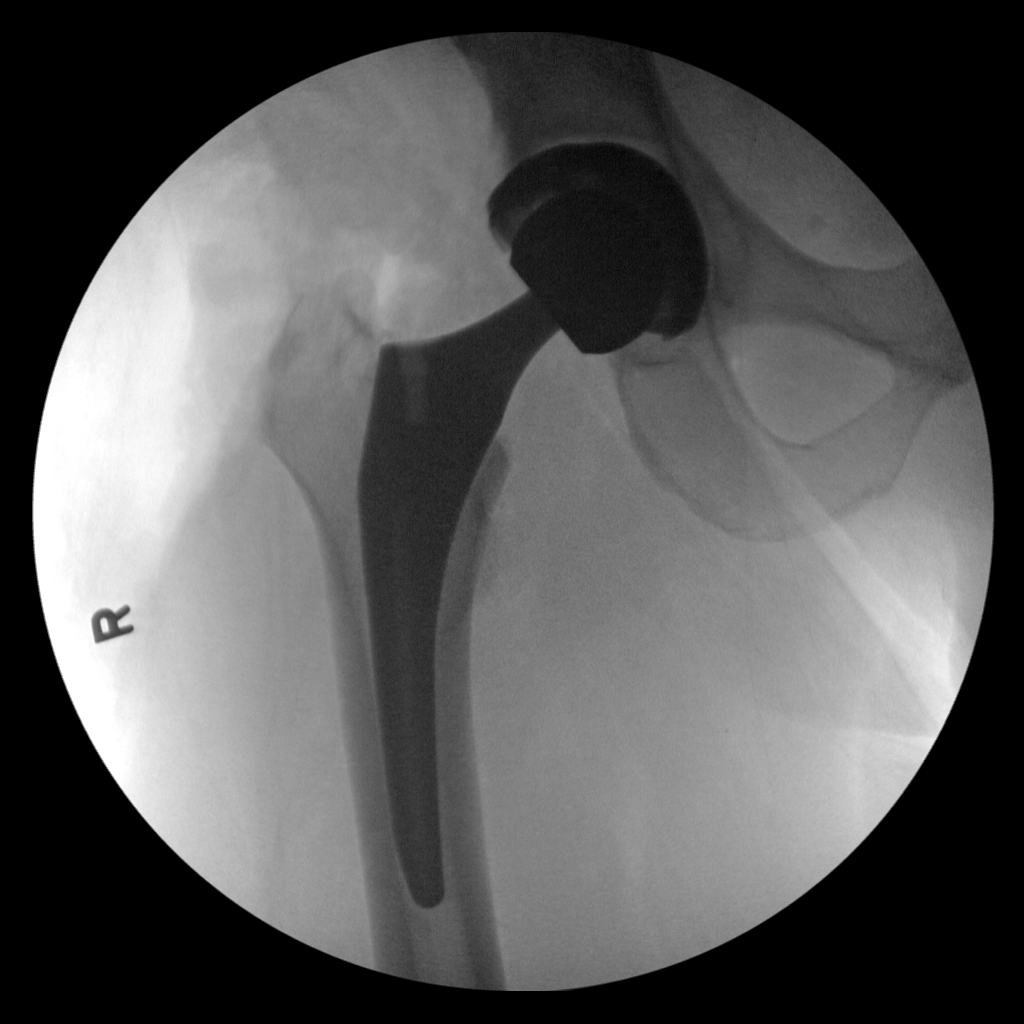
[im 2/2]
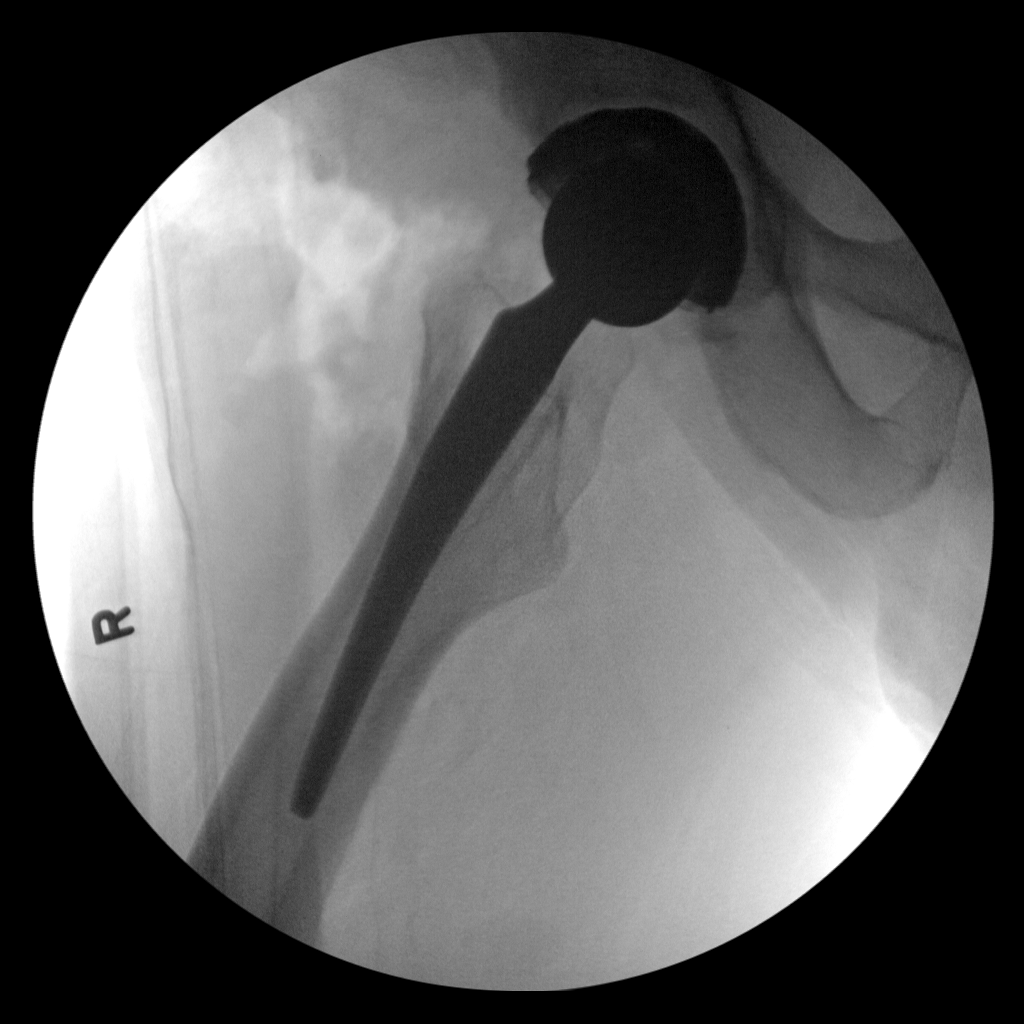

[2 of 2 positions shown; findings below may reference images not displayed]

FINDINGS: Two intraoperative spot images demonstrate changes of right hip
replacement. No hardware or bony complicating feature. Normal
alignment.
IMPRESSION: Right hip replacement. No complicating feature.

## 2015-02-01 ENCOUNTER — Other Ambulatory Visit: Payer: Self-pay | Admitting: Family Medicine

## 2015-02-01 DIAGNOSIS — J209 Acute bronchitis, unspecified: Secondary | ICD-10-CM

## 2015-02-01 MED ORDER — AZITHROMYCIN 250 MG PO TABS
ORAL_TABLET | ORAL | Status: DC
Start: 2015-02-01 — End: 2015-05-21

## 2015-05-12 DIAGNOSIS — N401 Enlarged prostate with lower urinary tract symptoms: Secondary | ICD-10-CM

## 2015-05-12 DIAGNOSIS — N138 Other obstructive and reflux uropathy: Secondary | ICD-10-CM | POA: Insufficient documentation

## 2015-05-21 ENCOUNTER — Emergency Department (HOSPITAL_COMMUNITY): Payer: Medicaid Other

## 2015-05-21 ENCOUNTER — Encounter (HOSPITAL_COMMUNITY): Payer: Self-pay

## 2015-05-21 ENCOUNTER — Inpatient Hospital Stay (HOSPITAL_COMMUNITY)
Admission: EM | Admit: 2015-05-21 | Discharge: 2015-05-21 | DRG: 897 | Disposition: A | Discharge status: Home / Self Care | Payer: Medicaid Other | Attending: Internal Medicine | Admitting: Internal Medicine

## 2015-05-21 DIAGNOSIS — R55 Syncope and collapse: Secondary | ICD-10-CM | POA: Diagnosis present

## 2015-05-21 DIAGNOSIS — D649 Anemia, unspecified: Secondary | ICD-10-CM

## 2015-05-21 DIAGNOSIS — Z880 Allergy status to penicillin: Secondary | ICD-10-CM | POA: Diagnosis not present

## 2015-05-21 DIAGNOSIS — J45909 Unspecified asthma, uncomplicated: Secondary | ICD-10-CM | POA: Diagnosis present

## 2015-05-21 DIAGNOSIS — F10120 Alcohol abuse with intoxication, uncomplicated: Principal | ICD-10-CM | POA: Diagnosis present

## 2015-05-21 DIAGNOSIS — E860 Dehydration: Secondary | ICD-10-CM | POA: Diagnosis present

## 2015-05-21 DIAGNOSIS — Z7952 Long term (current) use of systemic steroids: Secondary | ICD-10-CM

## 2015-05-21 DIAGNOSIS — S0081XA Abrasion of other part of head, initial encounter: Secondary | ICD-10-CM | POA: Diagnosis present

## 2015-05-21 DIAGNOSIS — R4182 Altered mental status, unspecified: Secondary | ICD-10-CM | POA: Diagnosis present

## 2015-05-21 DIAGNOSIS — N179 Acute kidney failure, unspecified: Secondary | ICD-10-CM | POA: Diagnosis present

## 2015-05-21 DIAGNOSIS — F10920 Alcohol use, unspecified with intoxication, uncomplicated: Secondary | ICD-10-CM

## 2015-05-21 DIAGNOSIS — Z79899 Other long term (current) drug therapy: Secondary | ICD-10-CM

## 2015-05-21 DIAGNOSIS — W1839XA Other fall on same level, initial encounter: Secondary | ICD-10-CM | POA: Diagnosis present

## 2015-05-21 DIAGNOSIS — Y9289 Other specified places as the place of occurrence of the external cause: Secondary | ICD-10-CM

## 2015-05-21 DIAGNOSIS — R7989 Other specified abnormal findings of blood chemistry: Secondary | ICD-10-CM

## 2015-05-21 DIAGNOSIS — E872 Acidosis: Secondary | ICD-10-CM | POA: Diagnosis present

## 2015-05-21 LAB — COMPREHENSIVE METABOLIC PANEL
ALBUMIN: 3.8 g/dL (ref 3.5–5.0)
ALK PHOS: 51 U/L (ref 38–126)
ALT: 27 U/L (ref 17–63)
AST: 24 U/L (ref 15–41)
Anion gap: 18 — ABNORMAL HIGH (ref 5–15)
BILIRUBIN TOTAL: 0.3 mg/dL (ref 0.3–1.2)
BUN: 17 mg/dL (ref 6–20)
CALCIUM: 8.8 mg/dL — AB (ref 8.9–10.3)
CO2: 18 mmol/L — ABNORMAL LOW (ref 22–32)
Chloride: 106 mmol/L (ref 101–111)
Creatinine, Ser: 1.49 mg/dL — ABNORMAL HIGH (ref 0.61–1.24)
GFR calc Af Amer: 59 mL/min — ABNORMAL LOW (ref >60)
GFR calc non Af Amer: 51 mL/min — ABNORMAL LOW (ref >60)
GLUCOSE: 150 mg/dL — AB (ref 65–99)
Potassium: 3.9 mmol/L (ref 3.5–5.1)
Sodium: 142 mmol/L (ref 135–145)
TOTAL PROTEIN: 7.2 g/dL (ref 6.5–8.1)

## 2015-05-21 LAB — RAPID URINE DRUG SCREEN, HOSP PERFORMED
AMPHETAMINES: NOT DETECTED
BARBITURATES: NOT DETECTED
Benzodiazepines: NOT DETECTED
Cocaine: NOT DETECTED
Opiates: NOT DETECTED
Tetrahydrocannabinol: NOT DETECTED

## 2015-05-21 LAB — CBC WITH DIFFERENTIAL/PLATELET
BASOS PCT: 0 %
Basophils Absolute: 0 10*3/uL (ref 0.0–0.1)
EOS ABS: 0 10*3/uL (ref 0.0–0.7)
Eosinophils Relative: 0 %
HEMATOCRIT: 38.6 % — AB (ref 39.0–52.0)
Hemoglobin: 12.7 g/dL — ABNORMAL LOW (ref 13.0–17.0)
LYMPHS ABS: 1.2 10*3/uL (ref 0.7–4.0)
Lymphocytes Relative: 20 %
MCH: 29.3 pg (ref 26.0–34.0)
MCHC: 32.9 g/dL (ref 30.0–36.0)
MCV: 88.9 fL (ref 78.0–100.0)
MONO ABS: 0.3 10*3/uL (ref 0.1–1.0)
MONOS PCT: 5 %
Neutro Abs: 4.2 10*3/uL (ref 1.7–7.7)
Neutrophils Relative %: 75 %
Platelets: 218 10*3/uL (ref 150–400)
RBC: 4.34 MIL/uL (ref 4.22–5.81)
RDW: 13.4 % (ref 11.5–15.5)
WBC: 5.7 10*3/uL (ref 4.0–10.5)

## 2015-05-21 LAB — HEMOGLOBIN AND HEMATOCRIT, BLOOD
HCT: 30.5 % — ABNORMAL LOW (ref 39.0–52.0)
Hemoglobin: 10.3 g/dL — ABNORMAL LOW (ref 13.0–17.0)

## 2015-05-21 LAB — I-STAT CG4 LACTIC ACID, ED
LACTIC ACID, VENOUS: 9.59 mmol/L — AB (ref 0.5–2.0)
Lactic Acid, Venous: 6.5 mmol/L (ref 0.5–2.0)

## 2015-05-21 LAB — I-STAT TROPONIN, ED: TROPONIN I, POC: 0.01 ng/mL (ref 0.00–0.08)

## 2015-05-21 LAB — ETHANOL: Alcohol, Ethyl (B): 90 mg/dL — ABNORMAL HIGH (ref <5)

## 2015-05-21 LAB — TROPONIN I

## 2015-05-21 LAB — LACTIC ACID, PLASMA: LACTIC ACID, VENOUS: 3 mmol/L — AB (ref 0.5–2.0)

## 2015-05-21 MED ORDER — SODIUM CHLORIDE 0.9 % IV BOLUS (SEPSIS)
1000.0000 mL | Freq: Once | INTRAVENOUS | Status: AC
  Administered 2015-05-21: 1000 mL via INTRAVENOUS

## 2015-05-21 MED ORDER — TETANUS-DIPHTH-ACELL PERTUSSIS 5-2.5-18.5 LF-MCG/0.5 IM SUSP
0.5000 mL | Freq: Once | INTRAMUSCULAR | Status: AC
Start: 2015-05-21 — End: 2015-05-21
  Administered 2015-05-21: 0.5 mL via INTRAMUSCULAR
  Filled 2015-05-21: qty 0.5

## 2015-05-21 MED ORDER — SODIUM CHLORIDE 0.9 % IV SOLN
1000.0000 mL | Freq: Once | INTRAVENOUS | Status: AC
  Administered 2015-05-21: 1000 mL via INTRAVENOUS

## 2015-05-21 MED ORDER — SODIUM CHLORIDE 0.9 % IV SOLN
1000.0000 mL | INTRAVENOUS | Status: DC
  Administered 2015-05-21: 1000 mL via INTRAVENOUS

## 2015-05-21 MED ORDER — ACETAMINOPHEN 325 MG PO TABS
650.0000 mg | ORAL_TABLET | Freq: Once | ORAL | Status: AC
  Administered 2015-05-21: 650 mg via ORAL
  Filled 2015-05-21: qty 2

## 2015-05-21 NOTE — Progress Notes (Signed)
Carryover pt from Dr. Preston FleetingGlick [name] Pope 55 year old male who was at a bar with wife and had went outside and reportedly passed out/fell on his face onto gravel. UDS negative, alcohol level 90 troponins wnl, CO2 18, BUN 17, Cr 1.49, anion gap 18, and initial lactic acid of 9.59. Patient given 2 L of IV fluids with improvement of lactic acid to 6.5. Question of syncope with acute alcoholic ketoacidosis, and acute kidney injury. Ordering stat urinalysis and an chest x-ray.

## 2015-05-21 NOTE — ED Notes (Addendum)
Notified EDP,Glick pt. i-stat CG4 Lactic acid 6.50 and RN,Stephanie made aware.

## 2015-05-21 NOTE — ED Notes (Signed)
MD at bedside. ADMITTING MD PRESENT SPEAKING WITH PT 

## 2015-05-21 NOTE — Consult Note (Signed)
Medical Consultation   [name] Pope  ZOX:[id number]  DOB: Aug 06, 1960  DOA: 05/21/2015  PCP: [name], MD   Outpatient Specialists:   Reason for consultation: Syncope   History of Present Illness: [name] Pope is an 55 y.o. male with no cardiac history, brought into the emergency department after having a syncopal event at a local bar. He had reported having several alcoholic drinks over the course of the evening at the bar then stood up to dance at which point he passed out. He denied chest pain, shortness of breath, palpitations, focal neurological deficits. He denied having a history of syncope, shortness of breath or exertional chest pain and feels that he had been doing overall well regard to his health.   Review of Systems: Workup in the emergency department included 2 troponin levels which were negative, x-ray did not show acute ischemic changes, BMP showed a creatinine 1.49 and initially had a lactic acid of 9.59.   ROS As per HPI otherwise 10 point review of systems negative.   Past Medical History: Past Medical History  Diagnosis Date  . Allergy   . Arthritis   . Osteoporosis   . Frequent urination at night   . Itching     all over body itching at bedtime  . Asthma   . Headache(784.0)   . Anginal pain (HCC)     CP for 15-35 seconds X 2 04/2013  . Appendicitis   . Lumbar radiculopathy     Past Surgical History: Past Surgical History  Procedure Laterality Date  . Appendectomy    . Total hip arthroplasty Right 10/22/2012    Procedure: RIGHT TOTAL HIP ARTHROPLASTY ANTERIOR APPROACH;  Surgeon: Sheral Apley, MD;  Location: [facility] OR;  Service: Orthopedics;  Laterality: Right;  . Total hip arthroplasty Left 05/12/2013    DR MURPHY  . Total hip arthroplasty Left 05/12/2013    Procedure: LEFT TOTAL HIP ARTHROPLASTY ANTERIOR APPROACH;  Surgeon: Sheral Apley, MD;  Location: [facility] OR;  Service: Orthopedics;  Laterality: Left;      Allergies:   Allergies  Allergen Reactions  . Penicillins     Childhood Has patient had a PCN reaction causing immediate rash, facial/tongue/throat swelling, SOB or lightheadedness with hypotension: n/a Has patient had a PCN reaction causing severe rash involving mucus membranes or skin necrosis: n/a Has patient had a PCN reaction that required hospitalization: n/a Has patient had a PCN reaction occurring within the last 10 years: n/a If all of the above answers are "NO", then may proceed with Cephalosporin use.      Social History:  reports that he has never smoked. He has never used smokeless tobacco. He reports that he drinks about 0.6 oz of alcohol per week. He reports that he does not use illicit drugs.   Family History: Family History  Problem Relation Age of Onset  . Diabetes Mother   . Diabetes Father   . Hypertension Father     Physical Exam: Filed Vitals:   05/21/15 0815 05/21/15 0830 05/21/15 0900 05/21/15 0930  BP: 114/77 110/66 115/80 116/93  Pulse: 86 84 83 95  Temp: 98 F (36.7 C)     TempSrc: Oral     Resp: 16 19 14 18   SpO2: 98% 99% 99% 100%    Constitutional: Nontoxic appearing, awake and alert, ambulated around the emergency department Eyes: PERLA, EOMI, irises appear normal, anicteric sclera,  ENMT:  external ears and nose appear normal,             Lips appears normal, oropharynx mucosa, tongue, posterior pharynx appear normal  Neck: neck appears normal, no masses, normal ROM, no thyromegaly, no JVD  CVS: S1-S2 clear, no murmur rubs or gallops, no LE edema, normal pedal pulses  Respiratory:  clear to auscultation bilaterally, no wheezing, rales or rhonchi. Respiratory effort normal. No accessory muscle use.  Abdomen: soft nontender, nondistended, normal bowel sounds, no hepatosplenomegaly, no hernias  Musculoskeletal: : no cyanosis, clubbing or edema noted bilaterally Neuro: Cranial nerves II-XII intact, strength, sensation, reflexes Psych:  judgement and insight appear normal, stable mood and affect, mental status Skin: no rashes or lesions or ulcers, no induration or nodules   Data reviewed:  I have personally reviewed following labs and imaging studies Labs:  CBC:  Recent Labs Lab 05/21/15 0215 05/21/15 0625  WBC 5.7  --   NEUTROABS 4.2  --   HGB 12.7* 10.3*  HCT 38.6* 30.5*  MCV 88.9  --   PLT 218  --     Basic Metabolic Panel:  Recent Labs Lab 05/21/15 0215  NA 142  K 3.9  CL 106  CO2 18*  GLUCOSE 150*  BUN 17  CREATININE 1.49*  CALCIUM 8.8*   GFR CrCl cannot be calculated (Unknown ideal weight.). Liver Function Tests:  Recent Labs Lab 05/21/15 0215  AST 24  ALT 27  ALKPHOS 51  BILITOT 0.3  PROT 7.2  ALBUMIN 3.8   No results for input(s): LIPASE, AMYLASE in the last 168 hours. No results for input(s): AMMONIA in the last 168 hours. Coagulation profile No results for input(s): INR, PROTIME in the last 168 hours.  Cardiac Enzymes:  Recent Labs Lab 05/21/15 0215  TROPONINI <0.03   BNP: Invalid input(s): POCBNP CBG: No results for input(s): GLUCAP in the last 168 hours. D-Dimer No results for input(s): DDIMER in the last 72 hours. Hgb A1c No results for input(s): HGBA1C in the last 72 hours. Lipid Profile No results for input(s): CHOL, HDL, LDLCALC, TRIG, CHOLHDL, LDLDIRECT in the last 72 hours. Thyroid function studies No results for input(s): TSH, T4TOTAL, T3FREE, THYROIDAB in the last 72 hours.  Invalid input(s): FREET3 Anemia work up No results for input(s): VITAMINB12, FOLATE, FERRITIN, TIBC, IRON, RETICCTPCT in the last 72 hours. Urinalysis    Component Value Date/Time   COLORURINE YELLOW 05/01/2013 1054   APPEARANCEUR CLEAR 05/01/2013 1054   LABSPEC >=1.030 05/29/2014 1627   PHURINE 6.0 05/29/2014 1627   GLUCOSEU NEGATIVE 05/29/2014 1627   HGBUR NEGATIVE 05/29/2014 1627   BILIRUBINUR NEGATIVE 05/29/2014 1627   KETONESUR NEGATIVE 05/29/2014 1627   PROTEINUR  NEGATIVE 05/29/2014 1627   UROBILINOGEN 0.2 05/29/2014 1627   NITRITE NEGATIVE 05/29/2014 1627   LEUKOCYTESUR NEGATIVE 05/29/2014 1627     Microbiology No results found for this or any previous visit (from the past 240 hour(s)).     Inpatient Medications:   Scheduled Meds:  Continuous Infusions: . sodium chloride Stopped (05/21/15 0807)     Radiological Exams on Admission: Ct Head Wo Contrast  05/21/2015  CLINICAL DATA:  Larey Seat forward onto gravel, found down by wife with facial abrasions. Possible drug overdose. History of headache. EXAM: CT HEAD WITHOUT CONTRAST CT CERVICAL SPINE WITHOUT CONTRAST TECHNIQUE: Multidetector CT imaging of the head and cervical spine was performed following the standard protocol without intravenous contrast. Multiplanar CT image reconstructions of the cervical spine were also generated. COMPARISON:  None. FINDINGS: CT  HEAD FINDINGS INTRACRANIAL CONTENTS: The ventricles and sulci are normal. No intraparenchymal hemorrhage, mass effect nor midline shift. Patchy supratentorial white matter hypodensities. No acute large vascular territory infarcts. No abnormal extra-axial fluid collections. Basal cisterns are patent. Mild calcific atherosclerosis of the carotid siphons. ORBITS: The included ocular globes and orbital contents are non-suspicious. Old appearing suspected RIGHT orbital floor fracture incompletely characterized. SINUSES: Mild paranasal sinus mucosal thickening. Mastoid air cells are well aerated. SKULL/SOFT TISSUES: No skull fracture. No significant soft tissue swelling. Small posterior scalp lipoma. CT CERVICAL SPINE FINDINGS OSSEOUS STRUCTURES: Cervical vertebral bodies and posterior elements are intact and aligned with straightened cervical lordosis. Mild C3-4 and C4-5 disc height loss with endplate spurring compatible with degenerative discs. No destructive bony lesions. C1-2 articulation maintained with moderate arthropathy. SOFT TISSUES: Included  prevertebral and paraspinal soft tissues are normal. IMPRESSION: CT HEAD: No acute intracranial process. Mild white matter changes compatible with chronic small vessel ischemic disease. CT CERVICAL SPINE: Straightened cervical lordosis without acute fracture or malalignment. Electronically Signed   By: Awilda Metro M.D.   On: 05/21/2015 02:51   Ct Cervical Spine Wo Contrast  05/21/2015  CLINICAL DATA:  Larey Seat forward onto gravel, found down by wife with facial abrasions. Possible drug overdose. History of headache. EXAM: CT HEAD WITHOUT CONTRAST CT CERVICAL SPINE WITHOUT CONTRAST TECHNIQUE: Multidetector CT imaging of the head and cervical spine was performed following the standard protocol without intravenous contrast. Multiplanar CT image reconstructions of the cervical spine were also generated. COMPARISON:  None. FINDINGS: CT HEAD FINDINGS INTRACRANIAL CONTENTS: The ventricles and sulci are normal. No intraparenchymal hemorrhage, mass effect nor midline shift. Patchy supratentorial white matter hypodensities. No acute large vascular territory infarcts. No abnormal extra-axial fluid collections. Basal cisterns are patent. Mild calcific atherosclerosis of the carotid siphons. ORBITS: The included ocular globes and orbital contents are non-suspicious. Old appearing suspected RIGHT orbital floor fracture incompletely characterized. SINUSES: Mild paranasal sinus mucosal thickening. Mastoid air cells are well aerated. SKULL/SOFT TISSUES: No skull fracture. No significant soft tissue swelling. Small posterior scalp lipoma. CT CERVICAL SPINE FINDINGS OSSEOUS STRUCTURES: Cervical vertebral bodies and posterior elements are intact and aligned with straightened cervical lordosis. Mild C3-4 and C4-5 disc height loss with endplate spurring compatible with degenerative discs. No destructive bony lesions. C1-2 articulation maintained with moderate arthropathy. SOFT TISSUES: Included prevertebral and paraspinal soft  tissues are normal. IMPRESSION: CT HEAD: No acute intracranial process. Mild white matter changes compatible with chronic small vessel ischemic disease. CT CERVICAL SPINE: Straightened cervical lordosis without acute fracture or malalignment. Electronically Signed   By: Awilda Metro M.D.   On: 05/21/2015 02:51   Dg Chest Port 1 View  05/21/2015  CLINICAL DATA:  Patient found on ground by wife with abrasions to face. EXAM: PORTABLE CHEST 1 VIEW COMPARISON:  05/01/2013 FINDINGS: Lungs are somewhat hypoinflated with no focal consolidation or effusion. Mild prominence of the right hilum likely due in part to the lordotic technique. Cardiac silhouette is within normal. Minimal degenerative change of the spine. IMPRESSION: No acute cardiopulmonary disease. Mild prominence of the right hilum likely due in part to the lordotic technique. Recommend follow-up PA and lateral chest radiograph on an elective basis for further evaluation. Electronically Signed   By: Elberta Fortis M.D.   On: 05/21/2015 07:13    Impression/Recommendations Active Problems:   Syncope and collapse  1.  Syncope. Likely secondary to a combination of alcohol and dehydration. He had reported having several alcoholic beverages at the bar then  stood up quickly to dance at which point he passed out. Denies having previous history of syncope or cardiac issues. He has not experience exertional chest pain or shortness of breath. EKG did not reveal acute ischemic changes, 2 troponins obtained in the emergency department negative. Overall he appeared improved with the administration of IV fluids.  2.  Lactic acidosis. I believe secondary to dehydration. Initial lab work showed creatinine of 1.49. He had been consuming alcoholic beverages at a bar prior to coming to the emergency department. On presentation lactic acid was 9.59 which came down to 3.0 over the course of the evening with IV fluid's. He is afebrile, nontoxic appearing, mentating  well, lab work showing white count of 5700. Instructed him to drink plenty of fluids and avoid alcoholic beverages.   Disposition [name] Pope is a 55 year old gentleman brought to the emergency department after having a syncopal event at a bar where he had been consuming alcohol. Lab work showing downward trend in his lactic acid from 9.59 initially to 3.0 with IV fluid administration in the emergency department. On my evaluation he stated feeling much better and wanted to go home rather than being admitted to the hospital. Explained the importance of drinking plenty of fluids today and avoiding alcoholic beverages. He verbalizes understanding of this and will follow-up with his primary care provider in one week.   Time Spent: 55 min  Jeralyn BennettZAMORA, Jeree Delcid M.D. [facility] 05/21/2015, 9:59 AM

## 2015-05-21 NOTE — ED Notes (Signed)
Bed: RESB Expected date:  Expected time:  Means of arrival:  Comments: EMS 54yo M overdose / hypotension

## 2015-05-21 NOTE — ED Notes (Signed)
MD at bedside. ADMITTING MD PRESENT TO EVALUATE PT 

## 2015-05-21 NOTE — ED Notes (Signed)
CHARGE DENISE A RN CALLED FOR 20 MINUTE TIMER. REQUESTING ADDITIONAL TIME.

## 2015-05-21 NOTE — ED Provider Notes (Addendum)
CSN: [id number]     Arrival date & time 05/21/15  0156 History   By signing my name below, I, [name] Pope, attest that this documentation has been prepared under the direction and in the presence of [name] Booze, MD.  Electronically Signed: Arvilla Pope, Medical Scribe. 05/21/2015. 2:40 AM.  Chief Complaint  Patient presents with  . Drug Overdose   The history is provided by the patient and the EMS personnel. No language interpreter was used.    HPI Comments: [name] Pope is a 55 y.o. male who presents to the Emergency Department for drug overdose. EMS reports he fell face first on gravel. Pt is a level 5 caveat for AMS  Past Medical History  Diagnosis Date  . Allergy   . Arthritis   . Osteoporosis   . Frequent urination at night   . Itching     all over body itching at bedtime  . Asthma   . Headache(784.0)   . Anginal pain     CP for 15-35 seconds X 2 04/2013  . Appendicitis   . Lumbar radiculopathy    Past Surgical History  Procedure Laterality Date  . Appendectomy    . Total hip arthroplasty Right 10/22/2012    Procedure: RIGHT TOTAL HIP ARTHROPLASTY ANTERIOR APPROACH;  Surgeon: Sheral Apley, MD;  Location: [facility] OR;  Service: Orthopedics;  Laterality: Right;  . Total hip arthroplasty Left 05/12/2013    DR MURPHY  . Total hip arthroplasty Left 05/12/2013    Procedure: LEFT TOTAL HIP ARTHROPLASTY ANTERIOR APPROACH;  Surgeon: Sheral Apley, MD;  Location: [facility] OR;  Service: Orthopedics;  Laterality: Left;   Family History  Problem Relation Age of Onset  . Diabetes Mother   . Diabetes Father   . Hypertension Father    Social History  Substance Use Topics  . Smoking status: Never Smoker   . Smokeless tobacco: Never Used  . Alcohol Use: 0.6 oz/week    1 Cans of beer per week     Comment: daily  BEER  LIQUOR OCC    Review of Systems  Unable to perform ROS: Mental status change  Skin: Positive for wound.   Allergies  Penicillins  Home Medications    Prior to Admission medications   Medication Sig Start Date End Date Taking? Authorizing Provider  albuterol (PROVENTIL HFA;VENTOLIN HFA) 108 (90 BASE) MCG/ACT inhaler Inhale 2 puffs into the lungs every 4 (four) hours as needed for wheezing or shortness of breath. 05/23/14   [name] Rings, MD  azithromycin (ZITHROMAX Z-PAK) 250 MG tablet Take 2 pills today, then 1 pill daily until gone. 02/01/15   [name] Sidle, MD  baclofen (LIORESAL) 10 MG tablet Take 1 tablet (10 mg total) by mouth 3 (three) times daily. 05/29/14   [name] Bong, MD  furosemide (LASIX) 20 MG tablet Take 20 mg by mouth daily as needed for fluid.    Historical Provider, MD  HYDROcodone-acetaminophen (NORCO/VICODIN) 5-325 MG per tablet Take 1-3 tablets by mouth every 6 (six) hours as needed for moderate pain.    Historical Provider, MD  HYDROcodone-homatropine (HYCODAN) 5-1.5 MG/5ML syrup Take 5 mLs by mouth every 6 (six) hours as needed for cough. 05/23/14   [name] Rings, MD  ivermectin (STROMECTOL) 3 MG TABS tablet Take 4 tablets (12 mg total) by mouth once. 07/31/14   [name] Sidle, MD  polyethylene glycol powder (GLYCOLAX/MIRALAX) powder Take 255 g (1 Container total) by mouth daily. 08/10/13   [name] Boop,  MD  predniSONE (DELTASONE) 50 MG tablet Take 1 pill daily for 5 days. 07/31/14   [name] SidleKurt Lauenstein, MD  tadalafil (CIALIS) 5 MG tablet Take 1 tablet (5 mg total) by mouth daily as needed for erectile dysfunction. 04/13/14   [name] SidleKurt Lauenstein, MD   SpO2 100% Physical Exam  Constitutional: He appears well-developed and well-nourished. No distress.  HENT:  Head: Normocephalic. Head is with abrasion.  abrasion on left side of forehead and right cheek  Eyes: Conjunctivae and EOM are normal. Pupils are equal, round, and reactive to light.  Neck: No JVD present.  Cardiovascular: Normal rate, regular rhythm and normal heart sounds.   No murmur heard. Pulmonary/Chest: Effort normal and breath sounds normal. He has no wheezes. He has  no rales. He exhibits no tenderness.  Abdominal: Soft. Bowel sounds are normal. He exhibits no distension and no mass. There is no tenderness.  Pelvis is stable and nontender.  Musculoskeletal: Normal range of motion. He exhibits no edema.  Lymphadenopathy:    He has no cervical adenopathy.  Neurological: No cranial nerve deficit. He exhibits normal muscle tone. Coordination normal.  Sleepy but arousable, oriented to person when aroused.  Skin: Skin is warm and dry. No rash noted.  Nursing note and vitals reviewed.   ED Course  Procedures (including critical care time) DIAGNOSTIC STUDIES: Oxygen Saturation is 95% on RA, adequate by my interpretation.    COORDINATION OF CARE: 4:17 AM Discussed treatment plan with pt at bedside and pt agreed to plan.   Labs Review Results for orders placed or performed during the hospital encounter of 05/21/15  Comprehensive metabolic panel  Result Value Ref Range   Sodium 142 135 - 145 mmol/L   Potassium 3.9 3.5 - 5.1 mmol/L   Chloride 106 101 - 111 mmol/L   CO2 18 (L) 22 - 32 mmol/L   Glucose, Bld 150 (H) 65 - 99 mg/dL   BUN 17 6 - 20 mg/dL   Creatinine, Ser 1.611.49 (H) 0.61 - 1.24 mg/dL   Calcium 8.8 (L) 8.9 - 10.3 mg/dL   Total Protein 7.2 6.5 - 8.1 g/dL   Albumin 3.8 3.5 - 5.0 g/dL   AST 24 15 - 41 U/L   ALT 27 17 - 63 U/L   Alkaline Phosphatase 51 38 - 126 U/L   Total Bilirubin 0.3 0.3 - 1.2 mg/dL   GFR calc non Af Amer 51 (L) >60 mL/min   GFR calc Af Amer 59 (L) >60 mL/min   Anion gap 18 (H) 5 - 15  Ethanol  Result Value Ref Range   Alcohol, Ethyl (B) 90 (H) <5 mg/dL  CBC with Differential  Result Value Ref Range   WBC 5.7 4.0 - 10.5 K/uL   RBC 4.34 4.22 - 5.81 MIL/uL   Hemoglobin 12.7 (L) 13.0 - 17.0 g/dL   HCT 09.638.6 (L) 04.539.0 - 40.952.0 %   MCV 88.9 78.0 - 100.0 fL   MCH 29.3 26.0 - 34.0 pg   MCHC 32.9 30.0 - 36.0 g/dL   RDW 81.113.4 91.411.5 - 78.215.5 %   Platelets 218 150 - 400 K/uL   Neutrophils Relative % 75 %   Neutro Abs 4.2 1.7 -  7.7 K/uL   Lymphocytes Relative 20 %   Lymphs Abs 1.2 0.7 - 4.0 K/uL   Monocytes Relative 5 %   Monocytes Absolute 0.3 0.1 - 1.0 K/uL   Eosinophils Relative 0 %   Eosinophils Absolute 0.0 0.0 - 0.7 K/uL   Basophils Relative  0 %   Basophils Absolute 0.0 0.0 - 0.1 K/uL  Urine rapid drug screen (hosp performed)  Result Value Ref Range   Opiates NONE DETECTED NONE DETECTED   Cocaine NONE DETECTED NONE DETECTED   Benzodiazepines NONE DETECTED NONE DETECTED   Amphetamines NONE DETECTED NONE DETECTED   Tetrahydrocannabinol NONE DETECTED NONE DETECTED   Barbiturates NONE DETECTED NONE DETECTED  Troponin I  Result Value Ref Range   Troponin I <0.03 <0.031 ng/mL  Hemoglobin and hematocrit, blood  Result Value Ref Range   Hemoglobin 10.3 (L) 13.0 - 17.0 g/dL   HCT 53.6 (L) 64.4 - 03.4 %  I-Stat CG4 Lactic Acid, ED  Result Value Ref Range   Lactic Acid, Venous 9.59 (HH) 0.5 - 2.0 mmol/L   Comment NOTIFIED PHYSICIAN   I-Stat Troponin, ED (not at [facility])  Result Value Ref Range   Troponin i, poc 0.01 0.00 - 0.08 ng/mL   Comment 3          I-Stat CG4 Lactic Acid, ED  Result Value Ref Range   Lactic Acid, Venous 6.50 (HH) 0.5 - 2.0 mmol/L   Comment NOTIFIED PHYSICIAN    Imaging Review Ct Head Wo Contrast  05/21/2015  CLINICAL DATA:  Larey Seat forward onto gravel, found down by wife with facial abrasions. Possible drug overdose. History of headache. EXAM: CT HEAD WITHOUT CONTRAST CT CERVICAL SPINE WITHOUT CONTRAST TECHNIQUE: Multidetector CT imaging of the head and cervical spine was performed following the standard protocol without intravenous contrast. Multiplanar CT image reconstructions of the cervical spine were also generated. COMPARISON:  None. FINDINGS: CT HEAD FINDINGS INTRACRANIAL CONTENTS: The ventricles and sulci are normal. No intraparenchymal hemorrhage, mass effect nor midline shift. Patchy supratentorial white matter hypodensities. No acute large vascular territory infarcts. No abnormal  extra-axial fluid collections. Basal cisterns are patent. Mild calcific atherosclerosis of the carotid siphons. ORBITS: The included ocular globes and orbital contents are non-suspicious. Old appearing suspected RIGHT orbital floor fracture incompletely characterized. SINUSES: Mild paranasal sinus mucosal thickening. Mastoid air cells are well aerated. SKULL/SOFT TISSUES: No skull fracture. No significant soft tissue swelling. Small posterior scalp lipoma. CT CERVICAL SPINE FINDINGS OSSEOUS STRUCTURES: Cervical vertebral bodies and posterior elements are intact and aligned with straightened cervical lordosis. Mild C3-4 and C4-5 disc height loss with endplate spurring compatible with degenerative discs. No destructive bony lesions. C1-2 articulation maintained with moderate arthropathy. SOFT TISSUES: Included prevertebral and paraspinal soft tissues are normal. IMPRESSION: CT HEAD: No acute intracranial process. Mild white matter changes compatible with chronic small vessel ischemic disease. CT CERVICAL SPINE: Straightened cervical lordosis without acute fracture or malalignment. Electronically Signed   By: Awilda Metro M.D.   On: 05/21/2015 02:51   Ct Cervical Spine Wo Contrast  05/21/2015  CLINICAL DATA:  Larey Seat forward onto gravel, found down by wife with facial abrasions. Possible drug overdose. History of headache. EXAM: CT HEAD WITHOUT CONTRAST CT CERVICAL SPINE WITHOUT CONTRAST TECHNIQUE: Multidetector CT imaging of the head and cervical spine was performed following the standard protocol without intravenous contrast. Multiplanar CT image reconstructions of the cervical spine were also generated. COMPARISON:  None. FINDINGS: CT HEAD FINDINGS INTRACRANIAL CONTENTS: The ventricles and sulci are normal. No intraparenchymal hemorrhage, mass effect nor midline shift. Patchy supratentorial white matter hypodensities. No acute large vascular territory infarcts. No abnormal extra-axial fluid collections. Basal  cisterns are patent. Mild calcific atherosclerosis of the carotid siphons. ORBITS: The included ocular globes and orbital contents are non-suspicious. Old appearing suspected RIGHT orbital floor fracture  incompletely characterized. SINUSES: Mild paranasal sinus mucosal thickening. Mastoid air cells are well aerated. SKULL/SOFT TISSUES: No skull fracture. No significant soft tissue swelling. Small posterior scalp lipoma. CT CERVICAL SPINE FINDINGS OSSEOUS STRUCTURES: Cervical vertebral bodies and posterior elements are intact and aligned with straightened cervical lordosis. Mild C3-4 and C4-5 disc height loss with endplate spurring compatible with degenerative discs. No destructive bony lesions. C1-2 articulation maintained with moderate arthropathy. SOFT TISSUES: Included prevertebral and paraspinal soft tissues are normal. IMPRESSION: CT HEAD: No acute intracranial process. Mild white matter changes compatible with chronic small vessel ischemic disease. CT CERVICAL SPINE: Straightened cervical lordosis without acute fracture or malalignment. Electronically Signed   By: Awilda Metro M.D.   On: 05/21/2015 02:51   Dg Chest Port 1 View  05/21/2015  CLINICAL DATA:  Patient found on ground by wife with abrasions to face. EXAM: PORTABLE CHEST 1 VIEW COMPARISON:  05/01/2013 FINDINGS: Lungs are somewhat hypoinflated with no focal consolidation or effusion. Mild prominence of the right hilum likely due in part to the lordotic technique. Cardiac silhouette is within normal. Minimal degenerative change of the spine. IMPRESSION: No acute cardiopulmonary disease. Mild prominence of the right hilum likely due in part to the lordotic technique. Recommend follow-up PA and lateral chest radiograph on an elective basis for further evaluation. Electronically Signed   By: Elberta Fortis M.D.   On: 05/21/2015 07:13   I have personally reviewed and evaluated these images and lab results as part of my medical decision-making.    EKG Interpretation   Date/Time:  Saturday May 21 2015 04:09:13 EDT Ventricular Rate:  96 PR Interval:  201 QRS Duration: 77 QT Interval:  351 QTC Calculation: 443 R Axis:   28 Text Interpretation:  Sinus rhythm Borderline prolonged PR interval When  compared with ECG of 05/01/2013, No significant change was found Confirmed  by [facility]  MD, Nixxon Faria ([id number]) on 05/21/2015 4:12:41 AM      CRITICAL CARE Performed by: NFAOZ,HYQMV Total critical care time: 60 minutes Critical care time was exclusive of separately billable procedures and treating other patients. Critical care was necessary to treat or prevent imminent or life-threatening deterioration. Critical care was time spent personally by me on the following activities: development of treatment plan with patient and/or surrogate as well as nursing, discussions with consultants, evaluation of patient's response to treatment, examination of patient, obtaining history from patient or surrogate, ordering and performing treatments and interventions, ordering and review of laboratory studies, ordering and review of radiographic studies, pulse oximetry and re-evaluation of patient's condition. MDM   Final diagnoses:  Syncope and collapse  Abrasion of face, initial encounter  Elevated lactic acid level  Acute kidney injury (nontraumatic) (HCC)  Normochromic normocytic anemia  Alcohol intoxication, uncomplicated (HCC)    Apparent syncopal episode with minor head injury. Patient does not have memory of the incident. Hypotension was noted initially and blood pressure is borderline in the ED. He is given aggressive IV hydration. He is sent for CT of head and cervical spine which showed no acute injury. Abdominal and chest exams were benign. Lactic acid level has come back markedly elevated. After 2 L of fluid, lactic acid level was repeated and was still significantly elevated although lower than initial. Hemoglobin was rechecked and has shown a drop  from his baseline. Mild elevation of creatinine is present which is increased from baseline. After 3 L of IV fluid, blood pressures come up to 110/83. Abdominal exam was repeated and continues to be completely  benign. He will need to be observed while IV hydration continues. Case is discussed with Dr. Katrinka Blazing of [facility] who agrees to admit the patient.  I personally performed the services described in this documentation, which was scribed in my presence. The recorded information has been reviewed and is accurate.     [name] Booze, MD 05/21/15 1610  [name] Booze, MD 05/21/15 [phone]

## 2015-05-21 NOTE — ED Notes (Signed)
Per EMS- Pt was Found on ground by wife with abrasions to  face. Pinpoint pupils- 2mg  intranasal and 2 IV narcan. intial bp 50/30. 700mls fluid given. Last bp 103/48.

## 2015-08-05 IMAGING — CR DG CHEST 2V
2 series · 2 of 2 positions shown · non-contrast
Comparison: DG CHEST 2 VIEW dated 11/29/2003

CLINICAL DATA: HX  CHEST PAIN

EXAM:
CHEST  2 VIEW

[w chest pa]
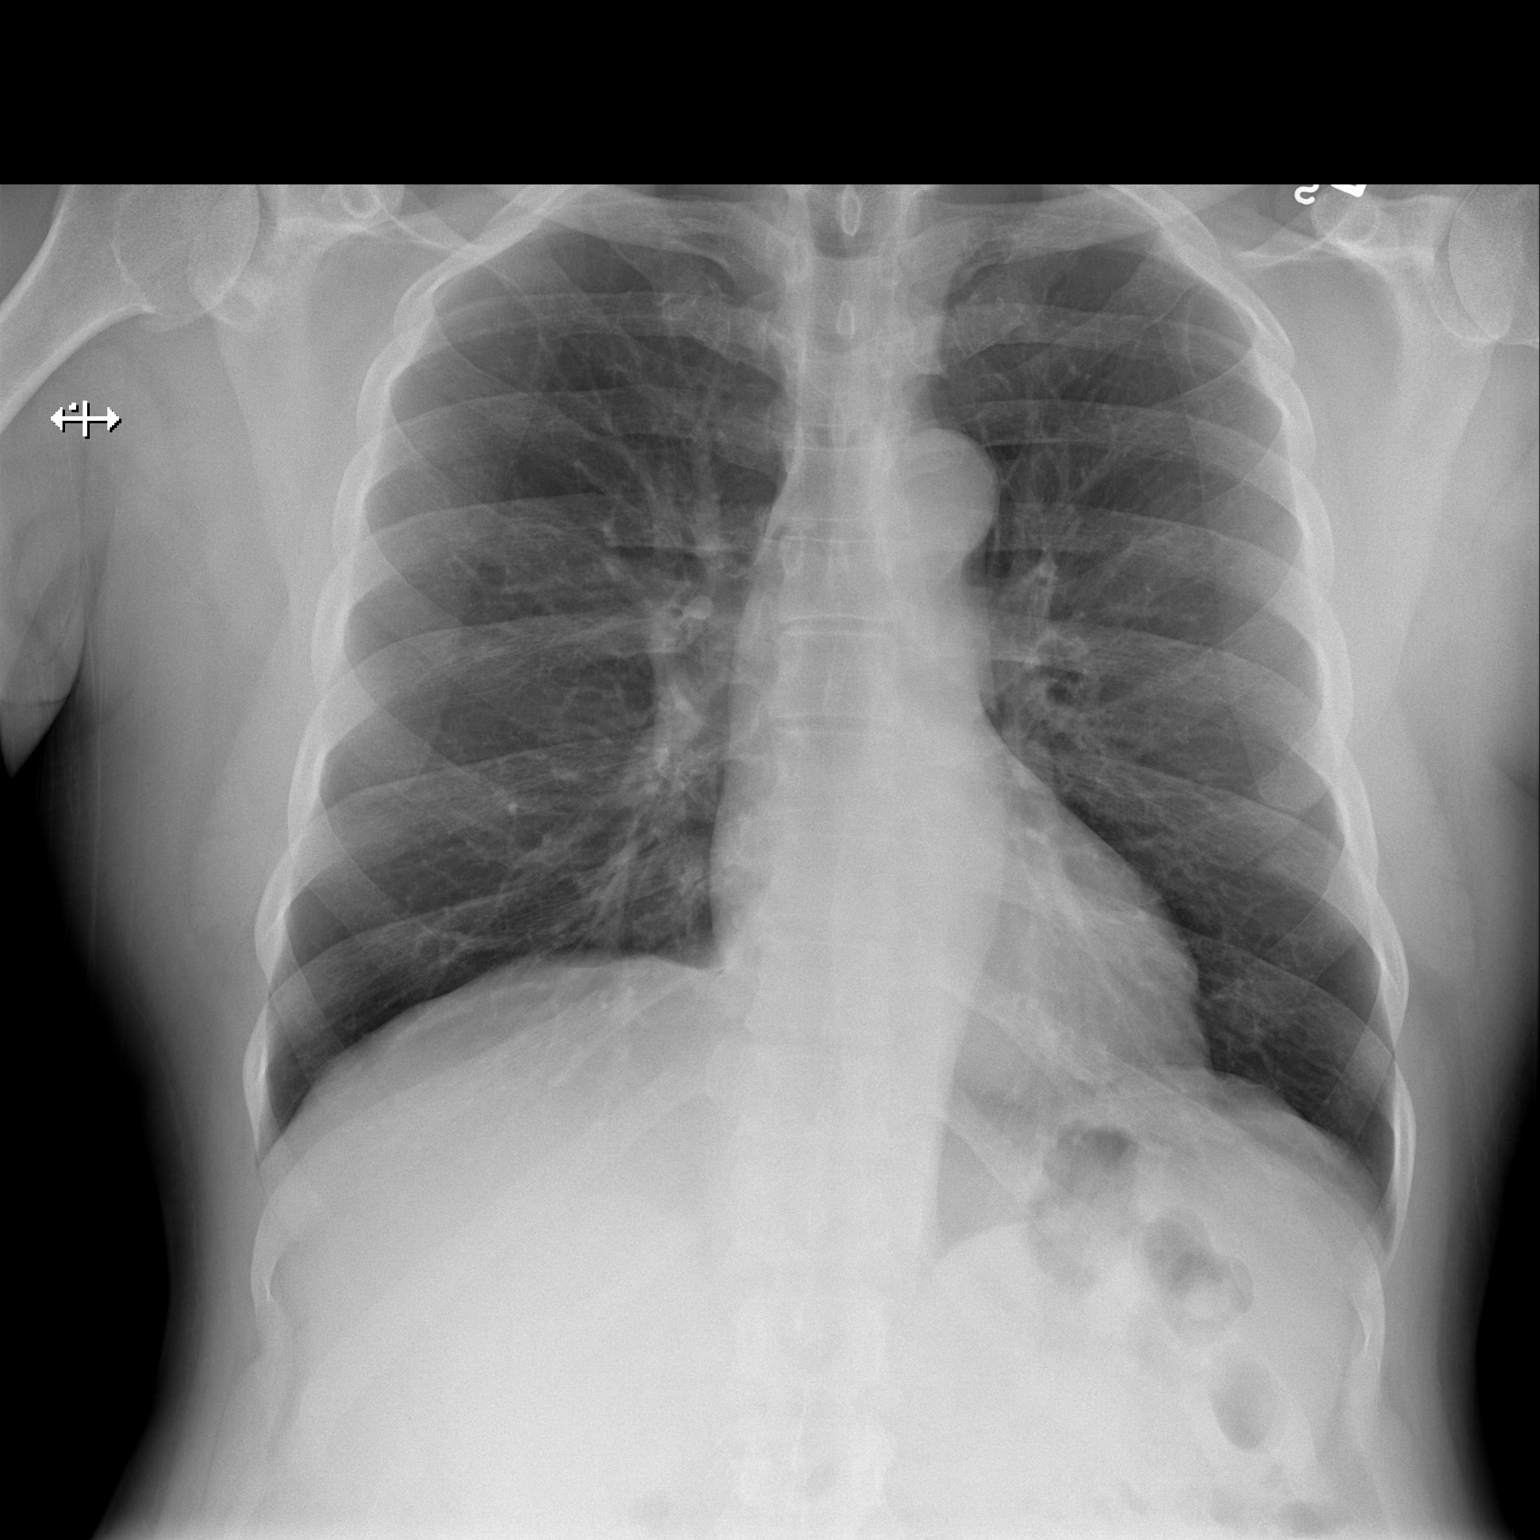

[w chest lat]
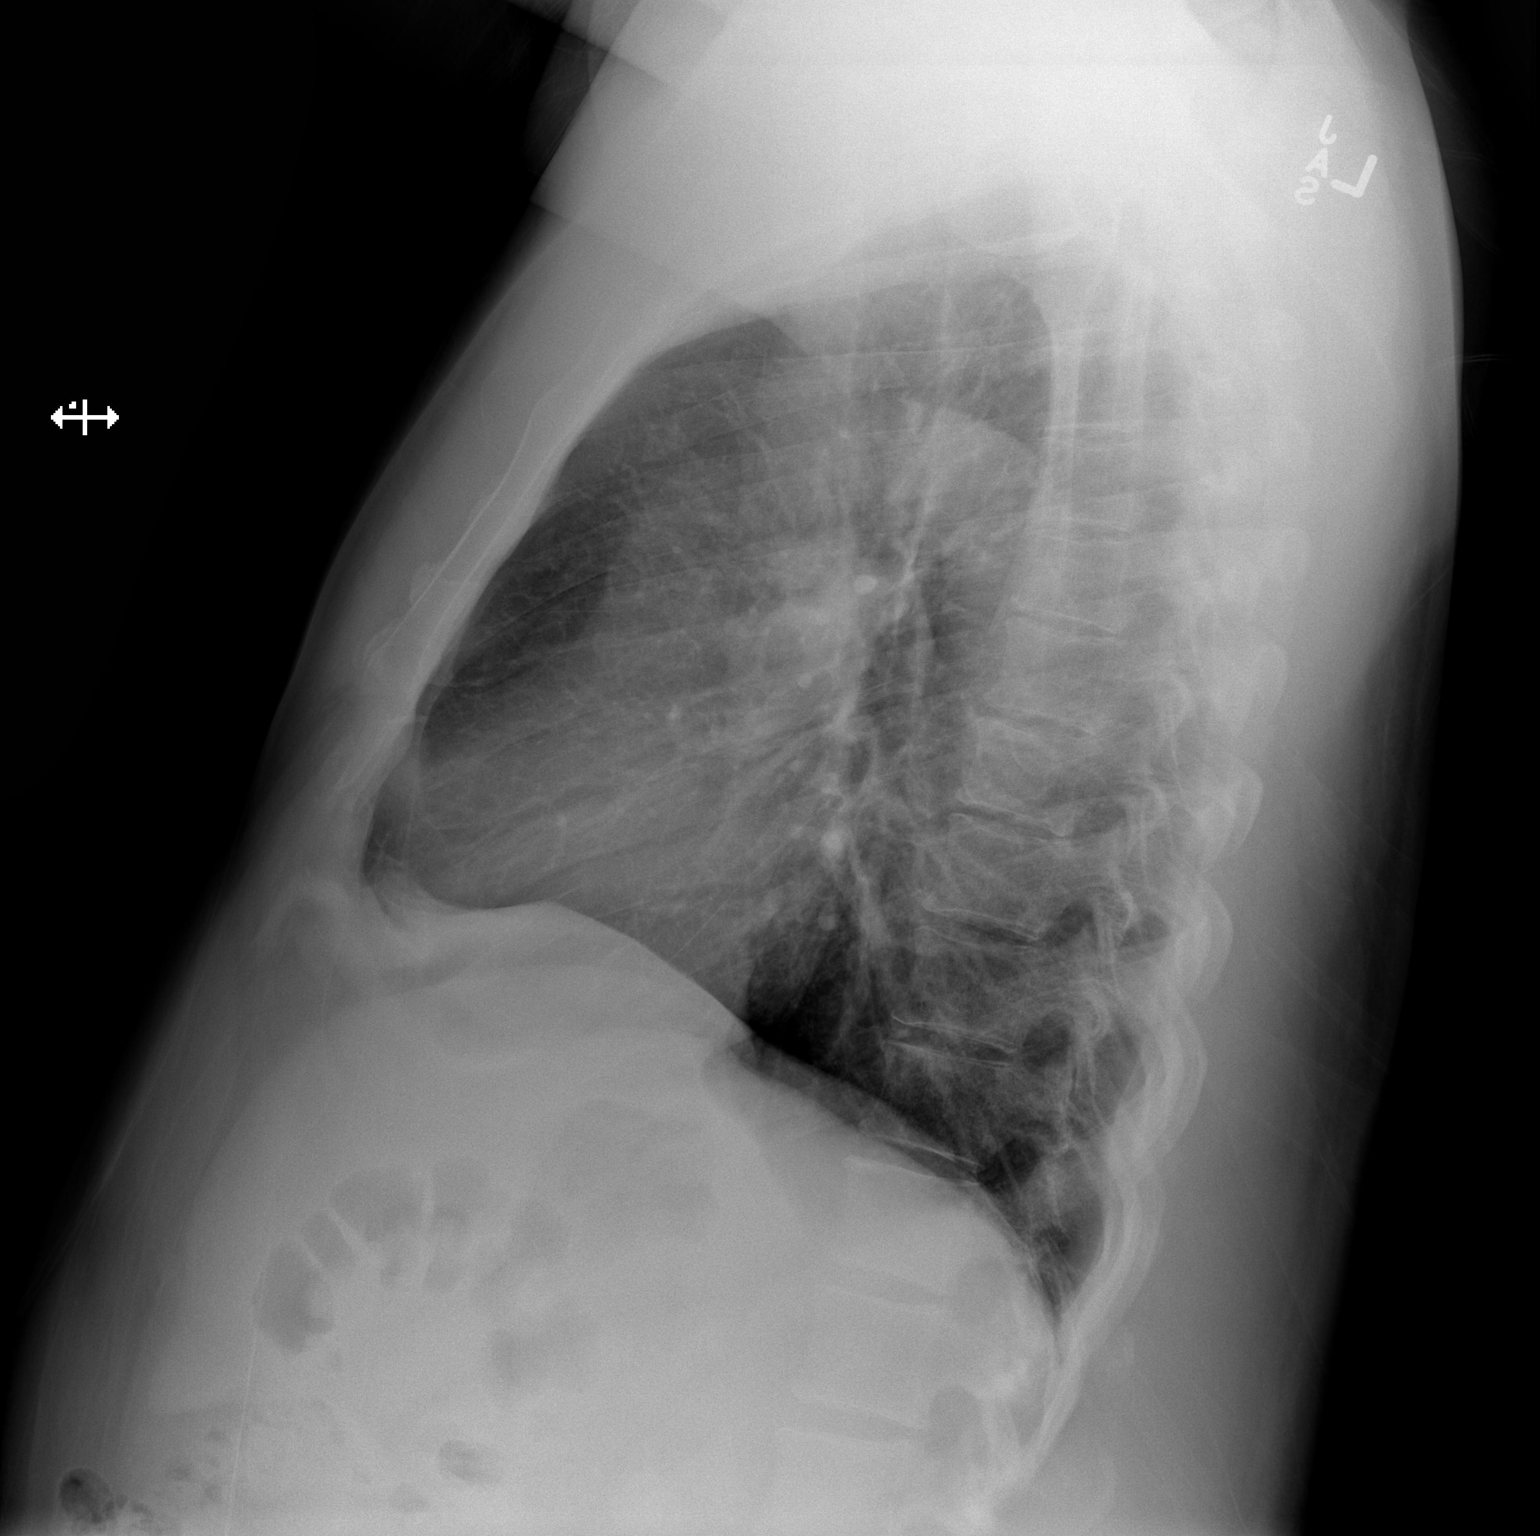

[2 of 2 positions shown; findings below may reference images not displayed]

FINDINGS: The heart size and mediastinal contours are within normal limits.
Both lungs are clear. The visualized skeletal structures are
unremarkable.
IMPRESSION: No active cardiopulmonary disease.

## 2016-05-19 ENCOUNTER — Encounter (HOSPITAL_COMMUNITY): Payer: Self-pay | Admitting: Emergency Medicine

## 2016-05-19 ENCOUNTER — Emergency Department (HOSPITAL_COMMUNITY)
Admission: EM | Admit: 2016-05-19 | Discharge: 2016-05-19 | Disposition: A | Discharge status: Home / Self Care | Payer: Medicaid Other | Attending: Emergency Medicine | Admitting: Emergency Medicine

## 2016-05-19 ENCOUNTER — Emergency Department (HOSPITAL_COMMUNITY): Payer: Medicaid Other

## 2016-05-19 ENCOUNTER — Emergency Department (HOSPITAL_BASED_OUTPATIENT_CLINIC_OR_DEPARTMENT_OTHER)
Admission: RE | Admit: 2016-05-19 | Discharge: 2016-05-19 | Disposition: A | Discharge status: Home / Self Care | Payer: Medicaid Other | Source: Ambulatory Visit | Attending: Emergency Medicine | Admitting: Emergency Medicine

## 2016-05-19 DIAGNOSIS — M1990 Unspecified osteoarthritis, unspecified site: Secondary | ICD-10-CM

## 2016-05-19 DIAGNOSIS — R609 Edema, unspecified: Secondary | ICD-10-CM

## 2016-05-19 DIAGNOSIS — J45909 Unspecified asthma, uncomplicated: Secondary | ICD-10-CM | POA: Diagnosis not present

## 2016-05-19 DIAGNOSIS — Z79899 Other long term (current) drug therapy: Secondary | ICD-10-CM | POA: Insufficient documentation

## 2016-05-19 DIAGNOSIS — Z96643 Presence of artificial hip joint, bilateral: Secondary | ICD-10-CM | POA: Diagnosis not present

## 2016-05-19 DIAGNOSIS — M064 Inflammatory polyarthropathy: Secondary | ICD-10-CM | POA: Diagnosis not present

## 2016-05-19 DIAGNOSIS — M25532 Pain in left wrist: Secondary | ICD-10-CM | POA: Diagnosis present

## 2016-05-19 DIAGNOSIS — M79609 Pain in unspecified limb: Secondary | ICD-10-CM | POA: Diagnosis not present

## 2016-05-19 LAB — CBC WITH DIFFERENTIAL/PLATELET
Basophils Absolute: 0 10*3/uL (ref 0.0–0.1)
Basophils Relative: 0 %
Eosinophils Absolute: 0 10*3/uL (ref 0.0–0.7)
Eosinophils Relative: 1 %
HCT: 41.1 % (ref 39.0–52.0)
Hemoglobin: 13.5 g/dL (ref 13.0–17.0)
Lymphocytes Relative: 27 %
Lymphs Abs: 1.4 10*3/uL (ref 0.7–4.0)
MCH: 29.2 pg (ref 26.0–34.0)
MCHC: 32.8 g/dL (ref 30.0–36.0)
MCV: 88.8 fL (ref 78.0–100.0)
Monocytes Absolute: 0.4 10*3/uL (ref 0.1–1.0)
Monocytes Relative: 8 %
Neutro Abs: 3.3 10*3/uL (ref 1.7–7.7)
Neutrophils Relative %: 64 %
Platelets: 238 10*3/uL (ref 150–400)
RBC: 4.63 MIL/uL (ref 4.22–5.81)
RDW: 13.8 % (ref 11.5–15.5)
WBC: 5.1 10*3/uL (ref 4.0–10.5)

## 2016-05-19 LAB — BASIC METABOLIC PANEL WITH GFR
Anion gap: 6 (ref 5–15)
BUN: 9 mg/dL (ref 6–20)
CO2: 27 mmol/L (ref 22–32)
Calcium: 9.1 mg/dL (ref 8.9–10.3)
Chloride: 103 mmol/L (ref 101–111)
Creatinine, Ser: 0.98 mg/dL (ref 0.61–1.24)
GFR calc Af Amer: >60 mL/min
GFR calc non Af Amer: >60 mL/min
Glucose, Bld: 123 mg/dL — ABNORMAL HIGH (ref 65–99)
Potassium: 3.9 mmol/L (ref 3.5–5.1)
Sodium: 136 mmol/L (ref 135–145)

## 2016-05-19 LAB — URIC ACID: Uric Acid, Serum: 5.8 mg/dL (ref 4.4–7.6)

## 2016-05-19 LAB — C-REACTIVE PROTEIN: CRP: 3.8 mg/dL — ABNORMAL HIGH

## 2016-05-19 LAB — SEDIMENTATION RATE: Sed Rate: 42 mm/hr — ABNORMAL HIGH (ref 0–16)

## 2016-05-19 MED ORDER — PREDNISONE 10 MG PO TABS: ORAL_TABLET | ORAL | 0 refills | Status: DC

## 2016-05-19 MED ORDER — MORPHINE SULFATE (PF) 4 MG/ML IV SOLN
4.0000 mg | Freq: Once | INTRAVENOUS | Status: AC
  Administered 2016-05-19: 4 mg via INTRAVENOUS
  Filled 2016-05-19: qty 1

## 2016-05-19 MED ORDER — KETOROLAC TROMETHAMINE 30 MG/ML IJ SOLN
30.0000 mg | Freq: Once | INTRAMUSCULAR | Status: AC
  Administered 2016-05-19: 30 mg via INTRAVENOUS
  Filled 2016-05-19: qty 1

## 2016-05-19 NOTE — Progress Notes (Signed)
VASCULAR LAB PRELIMINARY  PRELIMINARY  PRELIMINARY  PRELIMINARY  Left upper extremity venous duplex completed.    Preliminary report:  There is no acute DVT or SVT noted in the left upper extremity.  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  Called results to Dr. Berniece PapKnapp    [name] Pope, [facility] CenterCANDACE, RVT 05/19/2016, 11:48 AM

## 2016-05-19 NOTE — ED Provider Notes (Signed)
MSE was initiated and I personally evaluated the patient and placed orders (if any) at  10:00 AM on May 19, 2016.  Pt with atraumatic left hand and wrist, distal forearm pain and swelling x 2 days.  Is feeling subjective fevers, nausea, diarrhea.   Left hand and wrist diffusely edematous and tender.    Labs, pain medication, xrays, DVT study ordered.    The patient appears stable so that the remainder of the MSE may be completed by another provider.   Trixie DredgeWest, Tyreka Henneke, PA-C 05/19/16 1001    Geoffery Lyonselo, [place], MD 05/20/16 [phone]

## 2016-05-19 NOTE — ED Notes (Signed)
Report given to Audrey, RN.

## 2016-05-19 NOTE — Discharge Instructions (Signed)
Please read instructions below. Apply ice to your wrist for 20 minutes at a time.  Continue taking Mobic (Meloxicam) as prescribed. Do not take Advil (ibuprofen) with this medication. Take Prednisone, 60mg  for 2 days, then 40mg  for 2 days, then 20mg  for 2 days, then 10mg  for 2 days.  If you begin having a fever or if your symptoms have not improved by Wednesday, call Dr. Carollee Massedhompson's office, the hand specialist, for an urgent appointment. If you cannot be seen by their office within the day, return to the ER.

## 2016-05-19 NOTE — ED Notes (Signed)
PT REQUESTING PAIN MEDS 

## 2016-05-19 NOTE — ED Notes (Signed)
Wife out to desk requesting pain meds "he's laying in there moaning, thinking he's dying". Advised I would notify PA.

## 2016-05-19 NOTE — ED Notes (Signed)
PA requested pt be made acuity 3 for DVT workup. Will move to POD-E 41.

## 2016-05-19 NOTE — ED Triage Notes (Signed)
Pt. Stated, my left wrist is hurting so bad Im unable to sleep. No injury.

## 2016-05-19 NOTE — ED Notes (Signed)
C/o left wrist/hand pain x 2 days. No known injury. Some swelling noted to hand.

## 2016-05-19 NOTE — ED Provider Notes (Signed)
[facility] DEPT Provider Note   CSN: [id number] Arrival date & time: 05/19/16  0859     History   Chief Complaint Chief Complaint  Patient presents with  . Wrist Pain    HPI [name] Pope is a 56 y.o. male.  Patient with past medical history of arthritis, osteoporosis, presents with acute onset of left wrist pain began Thursday. Patient denies injury, and reports pain is throbbing, 10/10, not improved with BenGay or ice. Reports wrist and hand are swollen and he is unable to move his wrist. Denies N/T, cough, SOB. Denies previous injury, hx CA, hx gout, IVDU.      Past Medical History:  Diagnosis Date  . Allergy   . Anginal pain ([place])    CP for 15-35 seconds X 2 04/2013  . Appendicitis   . Arthritis   . Asthma   . Frequent urination at night   . Headache(784.0)   . Itching    all over body itching at bedtime  . Lumbar radiculopathy   . Osteoporosis     Patient Active Problem List   Diagnosis Date Noted  . Syncope and collapse 05/21/2015  . DJD (degenerative joint disease) 05/12/2013  . Hip pain, chronic 11/19/2011    Past Surgical History:  Procedure Laterality Date  . APPENDECTOMY    . TOTAL HIP ARTHROPLASTY Right 10/22/2012   Procedure: RIGHT TOTAL HIP ARTHROPLASTY ANTERIOR APPROACH;  Surgeon: Renette Butters, MD;  Location: [facility];  Service: Orthopedics;  Laterality: Right;  . TOTAL HIP ARTHROPLASTY Left 05/12/2013   DR MURPHY  . TOTAL HIP ARTHROPLASTY Left 05/12/2013   Procedure: LEFT TOTAL HIP ARTHROPLASTY ANTERIOR APPROACH;  Surgeon: Renette Butters, MD;  Location: [facility];  Service: Orthopedics;  Laterality: Left;       Home Medications    Prior to Admission medications   Medication Sig Start Date End Date Taking? Authorizing Provider  atorvastatin (LIPITOR) 40 MG tablet Take 40 mg by mouth daily. 04/01/15  Yes [provider]  gabapentin (NEURONTIN) 800 MG tablet Take 800 mg by mouth 3 (three) times daily.   Yes [provider]  meloxicam (MOBIC) 15 MG tablet Take 15 mg by mouth daily as needed for pain.    Yes [provider]  metFORMIN (GLUCOPHAGE) 500 MG tablet Take 500 mg by mouth 2 (two) times daily. 04/08/15  Yes [provider]  oxyCODONE-acetaminophen (PERCOCET) 10-325 MG tablet Take 1 tablet by mouth every 6 (six) hours as needed for pain.  04/26/15  Yes [provider]  albuterol (PROVENTIL HFA;VENTOLIN HFA) 108 (90 BASE) MCG/ACT inhaler Inhale 2 puffs into the lungs every 4 (four) hours as needed for wheezing or shortness of breath. 05/23/14   Melony Overly, MD  predniSONE (DELTASONE) 10 MG tablet Take 6 tablets for 2 days, then 4 tablets for 2 days, then 2 tablets for 2 days, then 1 tablet for 2 days. 05/19/16   Russo, [place] N, PA-C  tadalafil (CIALIS) 5 MG tablet Take 1 tablet (5 mg total) by mouth daily as needed for erectile dysfunction. Patient not taking: Reported on 05/19/2016 04/13/14   Robyn Haber, MD    Family History Family History  Problem Relation Age of Onset  . Diabetes Mother   . Diabetes Father   . Hypertension Father     Social History Social History  Substance Use Topics  . Smoking status: Never Smoker  . Smokeless tobacco: Never Used  . Alcohol use 0.6 oz/week  1 Cans of beer per week     Comment: daily  BEER  LIQUOR OCC     Allergies   Penicillins   Review of Systems Review of Systems  Constitutional: Positive for fever.  Respiratory: Negative for cough and shortness of breath.   Cardiovascular: Negative for chest pain.  Gastrointestinal: Positive for diarrhea and nausea. Negative for abdominal pain and vomiting.  Musculoskeletal: Positive for arthralgias (left wrist) and joint swelling (left wrist).  Skin: Negative for wound.     Physical Exam Updated Vital Signs BP (!) 133/97   Pulse 75   Temp 98.8 F (37.1 C) (Oral)   Resp 16   Ht _0  (1.753 m)   Wt 98 kg   SpO2 98%   BMI 31.90 kg/m   Physical Exam    Constitutional: He appears well-developed and well-nourished.  HENT:  Head: Normocephalic and atraumatic.  Eyes: Conjunctivae are normal.  Neck: Normal range of motion. Neck supple.  Cardiovascular: Normal rate, regular rhythm, normal heart sounds and intact distal pulses.  Exam reveals no gallop and no friction rub.   No murmur heard. Pulmonary/Chest: Effort normal and breath sounds normal. No respiratory distress. He has no wheezes. He has no rales.  Musculoskeletal:  L distal forearm, wrist and hand edematous. Mild increase in warmth compared to Right.  Anterior aspect of wrist TTP, base of thumb TTP. Dec ROM 2/t pain and stiffness. No gross deformities  Neurological:  L forearm, wrist and hand with normal sensation.  Psychiatric: He has a normal mood and affect. His behavior is normal.  Nursing note and vitals reviewed.    ED Treatments / Results  Labs (all labs ordered are listed, but only abnormal results are displayed) Labs Reviewed  BASIC METABOLIC PANEL - Abnormal; Notable for the following:       Result Value   Glucose, Bld 123 (*)    All other components within normal limits  SEDIMENTATION RATE - Abnormal; Notable for the following:    Sed Rate 42 (*)    All other components within normal limits  C-REACTIVE PROTEIN - Abnormal; Notable for the following:    CRP 3.8 (*)    All other components within normal limits  CBC WITH DIFFERENTIAL/PLATELET  URIC ACID    EKG  EKG Interpretation None       Radiology Dg Wrist Complete Left  Result Date: 05/19/2016 CLINICAL DATA:  Left hand pain and swelling since 05/17/16. Patient denies injury. Patient unable/unwilling to fully extend fingers for PA hand view. Pain primarily in carpal region, not fingers EXAM: LEFT WRIST - COMPLETE 3+ VIEW COMPARISON:  None. FINDINGS: Small erosions are seen throughout the carpal bones, most prominently affecting the scaphoid, lunate and capitate bones. No fracture line or displaced fracture  fragment seen. Adjacent soft tissues are unremarkable. IMPRESSION: Scattered erosions throughout the carpal bones, most prominently affecting the scaphoid, lunate and capitate bones, most likely related to underlying inflammatory arthritis. Electronically Signed   By: Franki Cabot M.D.   On: 05/19/2016 10:47   Dg Hand Complete Left  Result Date: 05/19/2016 CLINICAL DATA:  Left hand pain and swelling since 05/17/16. Patient denies injury. Patient unable/unwilling to fully extend fingers for PA hand view. Pain primarily in carpal region, not fingers EXAM: LEFT HAND - COMPLETE 3+ VIEW COMPARISON:  None. FINDINGS: Osseous alignment is normal. Bone mineralization is normal. No fracture line or displaced fracture fragment seen. No acute or suspicious osseous lesion. Subtle lucencies are seen within the scaphoid  bone, lunate bone and capitate bone, likely erosions related to underlying inflammatory arthritis. Small calcific density foreign body within the soft tissues adjacent to the second DIP joint, radial aspect, likely chronic soft tissue calcification IMPRESSION: 1. Subtle lucencies within the carpal bones, most prominently seen within the scaphoid, lunate and capitate, presumably erosions related to underlying inflammatory arthritis. 2. No acute findings. 3. Small calcific density foreign body within the soft tissues adjacent to the second DIP joint, radial aspect, suspected chronic/incidental soft tissue calcification unless focally symptomatic in this area. Electronically Signed   By: Franki Cabot M.D.   On: 05/19/2016 10:45   VASCULAR LAB PRELIMINARY  PRELIMINARY  PRELIMINARY  PRELIMINARY   Left upper extremity venous duplex completed.     Preliminary report:  There is no acute DVT or SVT noted in the left upper extremity.  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   Called results to Dr. Erenest Blank, [facility],  RVT 05/19/2016, 11:48 AM    Procedures Procedures (including critical care time)  Medications Ordered in ED Medications  morphine 4 MG/ML injection 4 mg (4 mg Intravenous Given 05/19/16 1115)  morphine 4 MG/ML injection 4 mg (4 mg Intravenous Given 05/19/16 1332)  ketorolac (TORADOL) 30 MG/ML injection 30 mg (30 mg Intravenous Given 05/19/16 1333)     Initial Impression / Assessment and Plan / ED Course  I have reviewed the triage vital signs and the nursing notes.  Pertinent labs & imaging results that were available during my care of the patient were reviewed by me and considered in my medical decision making (see chart for details).     Pt w inflammatory arthropathy of left wrist. Pt w dec ROM, pain and swelling, NV intact. DVT study negative for acute thrombosis. Xray w scattered erosions of carpal bones. ESR/CRP elevated. WBC wnl. Uric acid wnl. Hand specialist consulted, Dr Grandville Silos, who recommends prednisone and NSAIDs for inflammatory arthritis, likely 2/t gout vs pseudogout, with outpt follow up if symptoms don't improve within a few days. Septic arthritis not as likely, pt is afebrile, WBC wnl. Pt to schedule urgent appt with Dr. Grandville Silos or to return to ER if he develops a fever or if symptoms do not improve by Wednesday. Pt already taking mobic daily for chronic hip pain; Pt to continue taking mobic as prescribed, advised pt to stop taking advil with this medication. Pt safe for discharge.   Patient discussed with and seen by Dr. Tomi Bamberger.  Discussed results, findings, treatment and follow up. Patient advised of return precautions. Patient verbalized understanding and agreed with plan.   Final Clinical Impressions(s) / ED Diagnoses   Final diagnoses:  Inflammatory arthropathy    New Prescriptions New Prescriptions   PREDNISONE (DELTASONE) 10 MG TABLET    Take 6 tablets for 2 days, then 4 tablets for 2 days, then 2 tablets for 2 days, then 1 tablet for 2 days.     Russo,  [place] N, PA-C 05/19/16 [id number]    Dorie Rank, MD 05/20/16 [phone]

## 2016-06-07 ENCOUNTER — Encounter (HOSPITAL_COMMUNITY): Payer: Self-pay | Admitting: Emergency Medicine

## 2016-06-07 ENCOUNTER — Ambulatory Visit (HOSPITAL_COMMUNITY)
Admission: EM | Admit: 2016-06-07 | Discharge: 2016-06-07 | Disposition: A | Discharge status: Home / Self Care | Payer: Medicaid Other | Attending: Internal Medicine | Admitting: Internal Medicine

## 2016-06-07 DIAGNOSIS — M25532 Pain in left wrist: Secondary | ICD-10-CM

## 2016-06-07 DIAGNOSIS — M1990 Unspecified osteoarthritis, unspecified site: Secondary | ICD-10-CM

## 2016-06-07 MED ORDER — CYCLOBENZAPRINE HCL 10 MG PO TABS: 10.0000 mg | ORAL_TABLET | Freq: Two times a day (BID) | ORAL | 0 refills | Status: DC | PRN

## 2016-06-07 MED ORDER — PREDNISONE 10 MG PO TABS: ORAL_TABLET | ORAL | 0 refills | Status: DC

## 2016-06-07 NOTE — ED Triage Notes (Signed)
ED told patient he had arthritis in wrist (left ) wrist, also concerned for neck pain for 4 days

## 2016-06-07 NOTE — ED Provider Notes (Signed)
CSN: [id number]     Arrival date & time 06/07/16  1322 History   First MD Initiated Contact with Patient 06/07/16 1409     Chief Complaint  Patient presents with  . Wrist Pain   (Consider location/radiation/quality/duration/timing/severity/associated sxs/prior Treatment) Patient c/o left wrist pain and inflammation.  He states he was recently seen in the ED and dx'd with an inflammatory arthritis condition in his left wrist.  He was put on steroids and told to follow up with Hand Dr. Janee Mornhompson but did not because the pain went away and the swelling went down.   It has been a couple of weeks and the pain in his left wrist has returned.  He c/o mild neck discomfort.   The history is provided by the patient.  Wrist Pain  This is a new problem. The problem occurs constantly. Nothing aggravates the symptoms.    Past Medical History:  Diagnosis Date  . Allergy   . Anginal pain ([facility])    CP for 15-35 seconds X 2 04/2013  . Appendicitis   . Arthritis   . Asthma   . Frequent urination at night   . Headache(784.0)   . Itching    all over body itching at bedtime  . Lumbar radiculopathy   . Osteoporosis    Past Surgical History:  Procedure Laterality Date  . APPENDECTOMY    . 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 TOTAL HIP ARTHROPLASTY Left 05/12/2013   DR MURPHY  . TOTAL HIP ARTHROPLASTY Left 05/12/2013   Procedure: LEFT TOTAL HIP ARTHROPLASTY ANTERIOR APPROACH;  Surgeon: Sheral Apleyimothy D Murphy, MD;  Location: [facility] OR;  Service: Orthopedics;  Laterality: Left;   Family History  Problem Relation Age of Onset  . Diabetes Mother   . Diabetes Father   . Hypertension Father    Social History  Substance Use Topics  . Smoking status: Never Smoker  . Smokeless tobacco: Never Used  . Alcohol use 0.6 oz/week    1 Cans of beer per week   Comment: daily  BEER  LIQUOR OCC    Review of Systems  Constitutional: Negative.   HENT: Negative.   Eyes: Negative.   Respiratory: Negative.   Cardiovascular: Negative.   Gastrointestinal: Negative.   Endocrine: Negative.   Genitourinary: Negative.   Musculoskeletal: Positive for arthralgias.  Allergic/Immunologic: Negative.   Neurological: Negative.   Hematological: Negative.   Psychiatric/Behavioral: Negative.     Allergies  Penicillins  Home Medications   Prior to Admission medications   Medication Sig Start Date End Date Taking? Authorizing Provider  albuterol (PROVENTIL HFA;VENTOLIN HFA) 108 (90 BASE) MCG/ACT inhaler Inhale 2 puffs into the lungs every 4 (four) hours as needed for wheezing or shortness of breath. 05/23/14   Charm RingsHonig, Erin J, MD  atorvastatin (LIPITOR) 40 MG tablet Take 40 mg by mouth daily. 04/01/15   [provider]  cyclobenzaprine (FLEXERIL) 10 MG tablet Take 1 tablet (10 mg total) by mouth 2 (two) times daily as needed for muscle spasms. 06/07/16   Deatra Canterxford, Roston J, FNP  gabapentin (NEURONTIN) 800 MG tablet Take 800 mg by mouth 3 (three) times daily.    [provider]  meloxicam (MOBIC) 15 MG tablet Take 15 mg by mouth daily as needed for pain.     [provider]  metFORMIN (GLUCOPHAGE) 500 MG tablet Take 500  mg by mouth 2 (two) times daily. 04/08/15   [provider]  oxyCODONE-acetaminophen (PERCOCET) 10-325 MG tablet Take 1 tablet by mouth every 6 (six) hours as needed for pain.  04/26/15   [provider]  predniSONE (DELTASONE) 10 MG tablet Take 6 tablets for 2 days, then 4 tablets for 2 days, then 2 tablets for 2 days, then 1 tablet for 2 days. 05/19/16   Russo, [place] N, PA-C  predniSONE (DELTASONE) 10 MG tablet Take 6 po qd x 2 days then 5 po qd x 2d then 4po qd x 2d then 3po qd x 2d then 2po qd x 2d then 1 po qd x 2d then stop 06/07/16   Deatra Canter, FNP  tadalafil (CIALIS) 5 MG tablet Take 1 tablet (5  mg total) by mouth daily as needed for erectile dysfunction. Patient not taking: Reported on 05/19/2016 04/13/14   [name], MD   Meds Ordered and Administered this Visit  Medications - No data to display  BP 124/90 (BP Location: Left Arm)   Pulse 74   Temp 98.2 F (36.8 C) (Oral)   Resp 16   SpO2 98%  No data found.   Physical Exam  Constitutional: He appears well-developed and well-nourished.  HENT:  Head: Normocephalic and atraumatic.  Eyes: Conjunctivae and EOM are normal. Pupils are equal, round, and reactive to light.  Neck: Normal range of motion. Neck supple.  Cardiovascular: Normal rate, regular rhythm and normal heart sounds.   Pulmonary/Chest: Effort normal and breath sounds normal.  Musculoskeletal: He exhibits tenderness.  TTP left wrist and decreased ROM left wrist.  Nursing note and vitals reviewed.   Urgent Care Course     Procedures (including critical care time)  Labs Review Labs Reviewed - No data to display  Imaging Review No results found.   Visual Acuity Review  Right Eye Distance:   Left Eye Distance:   Bilateral Distance:    Right Eye Near:   Left Eye Near:    Bilateral Near:         MDM   1. Left wrist pain   2. Inflammatory arthropathy    Prednisone 10mg  Take 6x2 5x2 4x2 3x2 2x2 1x2 #42 Flexeril 10mg  one po bid prn #20  Follow up with on call Hand in one week      Demetrick, [place], FNP 06/07/16 1441

## 2016-06-14 ENCOUNTER — Ambulatory Visit (HOSPITAL_COMMUNITY)
Admission: EM | Admit: 2016-06-14 | Discharge: 2016-06-14 | Disposition: A | Discharge status: Home / Self Care | Payer: Medicaid Other | Attending: Internal Medicine | Admitting: Internal Medicine

## 2016-06-14 DIAGNOSIS — Z880 Allergy status to penicillin: Secondary | ICD-10-CM | POA: Diagnosis not present

## 2016-06-14 DIAGNOSIS — Z79899 Other long term (current) drug therapy: Secondary | ICD-10-CM | POA: Diagnosis not present

## 2016-06-14 DIAGNOSIS — Z8249 Family history of ischemic heart disease and other diseases of the circulatory system: Secondary | ICD-10-CM | POA: Diagnosis not present

## 2016-06-14 DIAGNOSIS — Z96643 Presence of artificial hip joint, bilateral: Secondary | ICD-10-CM | POA: Insufficient documentation

## 2016-06-14 DIAGNOSIS — J45909 Unspecified asthma, uncomplicated: Secondary | ICD-10-CM | POA: Diagnosis not present

## 2016-06-14 DIAGNOSIS — N451 Epididymitis: Secondary | ICD-10-CM | POA: Diagnosis not present

## 2016-06-14 DIAGNOSIS — Z7984 Long term (current) use of oral hypoglycemic drugs: Secondary | ICD-10-CM | POA: Insufficient documentation

## 2016-06-14 DIAGNOSIS — Z833 Family history of diabetes mellitus: Secondary | ICD-10-CM | POA: Diagnosis not present

## 2016-06-14 DIAGNOSIS — N50811 Right testicular pain: Secondary | ICD-10-CM | POA: Diagnosis present

## 2016-06-14 LAB — POCT URINALYSIS DIP (DEVICE)
BILIRUBIN URINE: NEGATIVE
GLUCOSE, UA: NEGATIVE mg/dL
Ketones, ur: NEGATIVE mg/dL
Leukocytes, UA: NEGATIVE
Nitrite: NEGATIVE
PH: 6 (ref 5.0–8.0)
PROTEIN: NEGATIVE mg/dL
Specific Gravity, Urine: >=1.03 (ref 1.005–1.030)
Urobilinogen, UA: 0.2 mg/dL (ref 0.0–1.0)

## 2016-06-14 MED ORDER — DOXYCYCLINE HYCLATE 100 MG PO CAPS: 100.0000 mg | ORAL_CAPSULE | Freq: Two times a day (BID) | ORAL | 0 refills | Status: DC

## 2016-06-14 NOTE — ED Triage Notes (Signed)
Pt presents with c/o testicular pain. The pain began last night in his left testicle and has now moved to both testicles. The pain is a constant aching pain. He reports dark urine and dysuria. He denies any noted activity at onset of pain. He denies fevers, nausea, vomiting, penile discharge, recent heavy exertion or straining.

## 2016-06-14 NOTE — ED Notes (Signed)
Bed: UC05 Expected date:  Expected time:  Means of arrival:  Comments: 

## 2016-06-14 NOTE — ED Provider Notes (Signed)
CSN: [id number]     Arrival date & time 06/14/16  1351 History   First MD Initiated Contact with Patient 06/14/16 1439     Chief Complaint  Patient presents with  . Testicle Pain   (Consider location/radiation/quality/duration/timing/severity/associated sxs/prior Treatment) 56 year old male presents with bilateral testicular pain. Last evening it started on the left and then moved to the right testicle. States the pain was primarily to the inferior pole of the testicles. He went to sleep last night the pain went away. When he awoke this morning it was present. He also states that his penis feels funny, he cannot describe it any better than that and that although denying a discharge he did see a scant amount of yellow fluid from the urethra. No history of trauma. States he has had a similar incident approximately 2 years ago and believe to be treated with antibiotic.      Past Medical History:  Diagnosis Date  . Allergy   . Anginal pain ([facility])    CP for 15-35 seconds X 2 04/2013  . Appendicitis   . Arthritis   . Asthma   . Frequent urination at night   . Headache(784.0)   . Itching    all over body itching at bedtime  . Lumbar radiculopathy   . Osteoporosis    Past Surgical History:  Procedure Laterality Date  . APPENDECTOMY    . TOTAL HIP ARTHROPLASTY Right 10/22/2012   Procedure: RIGHT TOTAL HIP ARTHROPLASTY ANTERIOR APPROACH;  Surgeon: Sheral Apley, MD;  Location: [facility];  Service: Orthopedics;  Laterality: Right;  . TOTAL HIP ARTHROPLASTY Left 05/12/2013   DR MURPHY  . TOTAL HIP ARTHROPLASTY Left 05/12/2013   Procedure: LEFT TOTAL HIP ARTHROPLASTY ANTERIOR APPROACH;  Surgeon: Sheral Apley, MD;  Location: [facility];  Service: Orthopedics;  Laterality: Left;   Family History  Problem Relation Age of Onset  . Diabetes Mother   . Diabetes Father   . Hypertension Father    Social History  Substance Use Topics  . Smoking status: Never Smoker  . Smokeless tobacco: Never Used   . Alcohol use 0.6 oz/week    1 Cans of beer per week     Comment: daily  BEER  LIQUOR OCC    Review of Systems  Constitutional: Negative.   Respiratory: Negative.   Gastrointestinal: Negative.   Genitourinary: Positive for penile pain and testicular pain. Negative for dysuria, flank pain, frequency, genital sores, hematuria, penile swelling, scrotal swelling and urgency.  Neurological: Negative.   All other systems reviewed and are negative.   Allergies  Penicillins  Home Medications   Prior to Admission medications   Medication Sig Start Date End Date Taking? Authorizing Provider  albuterol (PROVENTIL HFA;VENTOLIN HFA) 108 (90 BASE) MCG/ACT inhaler Inhale 2 puffs into the lungs every 4 (four) hours as needed for wheezing or shortness of breath. 05/23/14   [name], MD  atorvastatin (LIPITOR) 40 MG tablet Take 40 mg by mouth daily. 04/01/15   [provider]  cyclobenzaprine (FLEXERIL) 10 MG tablet Take 1 tablet (10 mg total) by mouth 2 (two) times daily as needed for muscle spasms. 06/07/16   Deatra Canter, FNP  doxycycline (VIBRAMYCIN) 100 MG capsule Take 1 capsule (100 mg total) by mouth 2 (two) times daily. 06/14/16   Hayden Rasmussen, NP  gabapentin (NEURONTIN) 800 MG tablet Take 800 mg by mouth 3 (three) times daily.    [provider]  meloxicam (MOBIC) 15 MG tablet Take  15 mg by mouth daily as needed for pain.     [provider]  metFORMIN (GLUCOPHAGE) 500 MG tablet Take 500 mg by mouth 2 (two) times daily. 04/08/15   [provider]  predniSONE (DELTASONE) 10 MG tablet Take 6 tablets for 2 days, then 4 tablets for 2 days, then 2 tablets for 2 days, then 1 tablet for 2 days. 05/19/16   Russo, [place] N, PA-C  predniSONE (DELTASONE) 10 MG tablet Take 6 po qd x 2 days then 5 po qd x 2d then 4po qd x 2d then 3po qd x 2d then 2po qd x 2d then 1 po qd x 2d then stop 06/07/16   Deatra Canterxford, Majd J, FNP   Meds Ordered and Administered this Visit   Medications - No data to display  BP 126/84   Pulse 77   Temp 98.4 F (36.9 C) (Oral)   Resp 16   SpO2 100%  No data found.   Physical Exam  Constitutional: He is oriented to person, place, and time. He appears well-developed and well-nourished.  HENT:  Head: Normocephalic and atraumatic.  Neck: Neck supple.  Cardiovascular: Normal rate.   Pulmonary/Chest: Effort normal. No respiratory distress.  Genitourinary:  Genitourinary Comments: Normal external male genitalia. No penile tenderness. There is tenderness to the anterior and posterior aspect of the bilateral testicles. Tenderness to the deeper epididymal apparatus at the superior poles. No evidence of orchitis or masses/swelling within the scrotum.  Musculoskeletal: Normal range of motion. He exhibits no edema.  Neurological: He is alert and oriented to person, place, and time.  Skin: Skin is warm and dry.  Nursing note and vitals reviewed.   Urgent Care Course     Procedures (including critical care time)  Labs Review Labs Reviewed  POCT URINALYSIS DIP (DEVICE) - Abnormal; Notable for the following:       Result Value   Hgb urine dipstick TRACE (*)    All other components within normal limits  URINE CYTOLOGY ANCILLARY ONLY    Imaging Review No results found.   Visual Acuity Review  Right Eye Distance:   Left Eye Distance:   Bilateral Distance:    Right Eye Near:   Left Eye Near:    Bilateral Near:         MDM   1. Epididymitis, bilateral    You are being treated for an infection in the tubes around the testicle. This is usually caused by an infection. Take the antibiotic as directed. For some people who take this medicine and are exposed to the sun even for short periods of time can easily have burning of the skin. Recommend staying out of the sun as much as possible while taking this medication. Urine cytology pending    Hayden RasmussenMabe, Guillaume Weninger, NP 06/14/16 1520

## 2016-06-14 NOTE — Discharge Instructions (Signed)
You are being treated for an infection in the tubes around the testicle. This is usually caused by an infection. Take the antibiotic as directed. For some people who take this medicine and are exposed to the sun even for short periods of time can easily have burning of the skin. Recommend staying out of the sun as much as possible while taking this medication.

## 2016-06-15 LAB — URINE CYTOLOGY ANCILLARY ONLY
CHLAMYDIA, DNA PROBE: NEGATIVE
NEISSERIA GONORRHEA: NEGATIVE
TRICH (WINDOWPATH): NEGATIVE

## 2017-02-17 ENCOUNTER — Other Ambulatory Visit: Payer: Self-pay

## 2017-02-17 ENCOUNTER — Ambulatory Visit (HOSPITAL_COMMUNITY)
Admission: EM | Admit: 2017-02-17 | Discharge: 2017-02-17 | Disposition: A | Discharge status: Home / Self Care | Payer: Medicaid Other | Attending: Internal Medicine | Admitting: Internal Medicine

## 2017-02-17 ENCOUNTER — Encounter (HOSPITAL_COMMUNITY): Payer: Self-pay | Admitting: Emergency Medicine

## 2017-02-17 DIAGNOSIS — H6692 Otitis media, unspecified, left ear: Secondary | ICD-10-CM

## 2017-02-17 DIAGNOSIS — H6123 Impacted cerumen, bilateral: Secondary | ICD-10-CM | POA: Diagnosis not present

## 2017-02-17 DIAGNOSIS — H9202 Otalgia, left ear: Secondary | ICD-10-CM | POA: Diagnosis not present

## 2017-02-17 MED ORDER — CEFDINIR 300 MG PO CAPS: 600.0000 mg | ORAL_CAPSULE | Freq: Every day | ORAL | 0 refills | Status: AC

## 2017-02-17 NOTE — ED Provider Notes (Signed)
[facility]    CSN: [id number] Arrival date & time: 02/17/17  1244     History   Chief Complaint Chief Complaint  Patient presents with  . Ear Fullness    HPI [name] Pope is a 57 y.o. male.   [name] presents with complaints of throbbing pain to bilateral ears, left is greater than right. It is worsening. Tried cleaning ears which did not help. States has started to get over a cold, runny nose and congestion have been improving. No known fevers. Pain is moderate in severity. Mildly decreased hearing.     ROS per HPI.       Past Medical History:  Diagnosis Date  . Allergy   . Anginal pain (HCC)    CP for 15-35 seconds X 2 04/2013  . Appendicitis   . Arthritis   . Asthma   . Frequent urination at night   . Headache(784.0)   . Itching    all over body itching at bedtime  . Lumbar radiculopathy   . Osteoporosis     Patient Active Problem List   Diagnosis Date Noted  . Syncope and collapse 05/21/2015  . DJD (degenerative joint disease) 05/12/2013  . Hip pain, chronic 11/19/2011    Past Surgical History:  Procedure Laterality Date  . APPENDECTOMY    . TOTAL HIP ARTHROPLASTY Right 10/22/2012   Procedure: RIGHT TOTAL HIP ARTHROPLASTY ANTERIOR APPROACH;  Surgeon: Sheral Apley, MD;  Location: [facility];  Service: Orthopedics;  Laterality: Right;  . TOTAL HIP ARTHROPLASTY Left 05/12/2013   DR MURPHY  . TOTAL HIP ARTHROPLASTY Left 05/12/2013   Procedure: LEFT TOTAL HIP ARTHROPLASTY ANTERIOR APPROACH;  Surgeon: Sheral Apley, MD;  Location: [facility];  Service: Orthopedics;  Laterality: Left;       Home Medications    Prior to Admission medications   Medication Sig Start Date End Date Taking? Authorizing Provider  albuterol (PROVENTIL HFA;VENTOLIN HFA) 108 (90 BASE) MCG/ACT inhaler Inhale 2 puffs into the lungs every 4 (four) hours as needed for wheezing or shortness of breath. 05/23/14   [name], MD  atorvastatin (LIPITOR) 40 MG tablet Take  40 mg by mouth daily. 04/01/15   [provider]  cefdinir (OMNICEF) 300 MG capsule Take 2 capsules (600 mg total) by mouth daily for 7 days. 02/17/17 02/24/17  Georgetta Haber, NP  cyclobenzaprine (FLEXERIL) 10 MG tablet Take 1 tablet (10 mg total) by mouth 2 (two) times daily as needed for muscle spasms. 06/07/16   Deatra Canter, FNP  gabapentin (NEURONTIN) 800 MG tablet Take 800 mg by mouth 3 (three) times daily.    [provider]  meloxicam (MOBIC) 15 MG tablet Take 15 mg by mouth daily as needed for pain.     [provider]  metFORMIN (GLUCOPHAGE) 500 MG tablet Take 500 mg by mouth 2 (two) times daily. 04/08/15   [provider]    Family History Family History  Problem Relation Age of Onset  . Diabetes Mother   . Diabetes Father   . Hypertension Father     Social History Social History   Tobacco Use  . Smoking status: Never Smoker  . Smokeless tobacco: Never Used  Substance Use Topics  . Alcohol use: Yes    Alcohol/week: 0.6 oz    Types: 1 Cans of beer per week    Comment: daily  BEER  LIQUOR OCC  . Drug use: No     Allergies  Penicillins   Review of Systems Review of Systems   Physical Exam Triage Vital Signs ED Triage Vitals [02/17/17 1448]  Enc Vitals Group     BP 122/86     Pulse Rate 79     Resp 18     Temp 98 F (36.7 C)     Temp Source Oral     SpO2 97 %     Weight      Height      Head Circumference      Peak Flow      Pain Score      Pain Loc      Pain Edu?      Excl. in GC?    No data found.  Updated Vital Signs BP 122/86 (BP Location: Left Arm)   Pulse 79   Temp 98 F (36.7 C) (Oral)   Resp 18   SpO2 97%   Visual Acuity Right Eye Distance:   Left Eye Distance:   Bilateral Distance:    Right Eye Near:   Left Eye Near:    Bilateral Near:     Physical Exam  Constitutional: He is oriented to person, place, and time. He appears well-developed and well-nourished.  HENT:  Head:  Normocephalic and atraumatic.  Right Ear: Hearing, tympanic membrane, external ear and ear canal normal.  Left Ear: External ear normal. Tympanic membrane is erythematous and bulging.  Nose: Nose normal.  Mouth/Throat: Oropharynx is clear and moist.  Patient unable to tolerate complete flush of left ear due to pain; TM still partially occluded with cerumen; visible TM very red and bulging, concerning for OM  Cardiovascular: Normal rate and regular rhythm.  Pulmonary/Chest: Effort normal and breath sounds normal.  Neurological: He is alert and oriented to person, place, and time.  Skin: Skin is warm and dry.     UC Treatments / Results  Labs (all labs ordered are listed, but only abnormal results are displayed) Labs Reviewed - No data to display  EKG  EKG Interpretation None       Radiology No results found.  Procedures Procedures (including critical care time)  Medications Ordered in UC Medications - No data to display   Initial Impression / Assessment and Plan / UC Course  I have reviewed the triage vital signs and the nursing notes.  Pertinent labs & imaging results that were available during my care of the patient were reviewed by me and considered in my medical decision making (see chart for details).     Continue with use of debrox to soften wax and use bulb syringe to extract. Complete course of antibiotics.  If symptoms worsen or do not improve in the next week to return to be seen or to follow up with PCP.  Patient verbalized understanding and agreeable to plan.    Final Clinical Impressions(s) / UC Diagnoses   Final diagnoses:  Left otitis media, unspecified otitis media type    ED Discharge Orders        Ordered    cefdinir (OMNICEF) 300 MG capsule  Daily     02/17/17 1540       Controlled Substance Prescriptions [place] Controlled Substance Registry consulted? Not Applicable   Georgetta HaberBurky, Natalie B, NP 02/17/17 1544

## 2017-02-17 NOTE — Discharge Instructions (Signed)
Please continue to use Debrox to help with ear wax removal, avoid use of qtips. Complete course of antibiotics. Please follow up with your primary care provider if worsening of symptoms or no improvement in the next week.

## 2017-02-17 NOTE — ED Triage Notes (Signed)
Bilateral ear fullness for 3 weeks, difficulty sleeping due to hearing heartbeat at night in his ears.  Hears popping and cracking.  Patient has used q-tips

## 2017-06-18 ENCOUNTER — Encounter (HOSPITAL_COMMUNITY): Payer: Self-pay | Admitting: Emergency Medicine

## 2017-06-18 ENCOUNTER — Ambulatory Visit (HOSPITAL_COMMUNITY)
Admission: EM | Admit: 2017-06-18 | Discharge: 2017-06-18 | Disposition: A | Discharge status: Home / Self Care | Payer: Medicaid Other | Attending: Family Medicine | Admitting: Family Medicine

## 2017-06-18 ENCOUNTER — Other Ambulatory Visit: Payer: Self-pay

## 2017-06-18 DIAGNOSIS — N451 Epididymitis: Secondary | ICD-10-CM | POA: Diagnosis not present

## 2017-06-18 DIAGNOSIS — Z79899 Other long term (current) drug therapy: Secondary | ICD-10-CM | POA: Diagnosis not present

## 2017-06-18 DIAGNOSIS — M810 Age-related osteoporosis without current pathological fracture: Secondary | ICD-10-CM | POA: Diagnosis not present

## 2017-06-18 DIAGNOSIS — N50811 Right testicular pain: Secondary | ICD-10-CM | POA: Diagnosis not present

## 2017-06-18 DIAGNOSIS — M5416 Radiculopathy, lumbar region: Secondary | ICD-10-CM | POA: Insufficient documentation

## 2017-06-18 DIAGNOSIS — J45909 Unspecified asthma, uncomplicated: Secondary | ICD-10-CM | POA: Insufficient documentation

## 2017-06-18 LAB — POCT URINALYSIS DIP (DEVICE)
BILIRUBIN URINE: NEGATIVE
GLUCOSE, UA: NEGATIVE mg/dL
Hgb urine dipstick: NEGATIVE
Ketones, ur: NEGATIVE mg/dL
LEUKOCYTES UA: NEGATIVE
NITRITE: NEGATIVE
Protein, ur: NEGATIVE mg/dL
Specific Gravity, Urine: 1.025 (ref 1.005–1.030)
UROBILINOGEN UA: 0.2 mg/dL (ref 0.0–1.0)
pH: 7 (ref 5.0–8.0)

## 2017-06-18 MED ORDER — DOXYCYCLINE HYCLATE 100 MG PO CAPS: 100.0000 mg | ORAL_CAPSULE | Freq: Two times a day (BID) | ORAL | 0 refills | Status: DC

## 2017-06-18 NOTE — ED Provider Notes (Signed)
MRN: [id number] DOB: [birth year]  Subjective:   [name] Pope is a 57 y.o. male presenting for 6 day history of persistent pain.  Patient has been seen here for the same, was managed for epididymitis with doxycycline.  Patient reports significant improvement with that antibiotic.  STI testing then was negative, had testing done in 06/27/2016.  Today, denies fever, dysuria, hematuria, urinary frequency, nausea, vomiting, belly pain, pelvic pain, genital rash.  He has not tried any medications for relief.  He is agreeable to repeat STI testing today.  No current facility-administered medications for this encounter.   Current Outpatient Medications:  .  oxyCODONE-acetaminophen (PERCOCET) 10-325 MG tablet, Take 1 tablet by mouth every 4 (four) hours as needed for pain., Disp: , Rfl:  .  albuterol (PROVENTIL HFA;VENTOLIN HFA) 108 (90 BASE) MCG/ACT inhaler, Inhale 2 puffs into the lungs every 4 (four) hours as needed for wheezing or shortness of breath., Disp: 1 Inhaler, Rfl: 0 .  atorvastatin (LIPITOR) 40 MG tablet, Take 40 mg by mouth daily., Disp: , Rfl:  .  cyclobenzaprine (FLEXERIL) 10 MG tablet, Take 1 tablet (10 mg total) by mouth 2 (two) times daily as needed for muscle spasms., Disp: 20 tablet, Rfl: 0 .  gabapentin (NEURONTIN) 800 MG tablet, Take 800 mg by mouth 3 (three) times daily., Disp: , Rfl:  .  meloxicam (MOBIC) 15 MG tablet, Take 15 mg by mouth daily as needed for pain. , Disp: , Rfl:  .  metFORMIN (GLUCOPHAGE) 500 MG tablet, Take 500 mg by mouth 2 (two) times daily., Disp: , Rfl:    Allergies  Allergen Reactions  . Penicillins     Childhood Has patient had a PCN reaction causing immediate rash, facial/tongue/throat swelling, SOB or lightheadedness with hypotension: n/a Has patient had a PCN reaction causing severe rash involving mucus membranes or skin necrosis: n/a Has patient had a PCN reaction that required hospitalization: n/a Has patient had a PCN reaction occurring  within the last 10 years: n/a If all of the above answers are "NO", then may proceed with Cephalosporin use.     Past Medical History:  Diagnosis Date  . Allergy   . Anginal pain (HCC)    CP for 15-35 seconds X 2 04/2013  . Appendicitis   . Arthritis   . Asthma   . Frequent urination at night   . Headache(784.0)   . Itching    all over body itching at bedtime  . Lumbar radiculopathy   . Osteoporosis      Past Surgical History:  Procedure Laterality Date  . APPENDECTOMY    . 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 TOTAL HIP ARTHROPLASTY Left 05/12/2013   DR MURPHY  . TOTAL HIP ARTHROPLASTY Left 05/12/2013   Procedure: LEFT TOTAL HIP ARTHROPLASTY ANTERIOR APPROACH;  Surgeon: Sheral Apleyimothy D Murphy, MD;  Location: [facility];  Service: Orthopedics;  Laterality: Left;    Objective:   Vitals: BP 121/81 (BP Location: Left Arm)   Pulse 93   Temp 97.8 F (36.6 C) (Oral)   Resp 18   SpO2 98%   Physical Exam  Constitutional: He is oriented to person, place, and time. He appears well-developed and well-nourished.  Cardiovascular: Normal rate.  Pulmonary/Chest: Effort normal.  Genitourinary: Right testis shows tenderness (overlying epididymis). Right testis shows no mass and no swelling. 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 Cremasteric reflex is not absent on the right side. Left testis shows no mass, no swelling and no tenderness. Left testis is descended. Cremasteric reflex is not absent on the left side. Circumcised.  Neurological: He is alert and oriented to person, place, and time.   Results for orders placed or performed during the hospital encounter of 06/18/17 (from the past 24 hour(s))  POCT urinalysis dip (device)     Status: None   Collection Time: 06/18/17  1:49 PM  Result Value Ref Range   Glucose, UA NEGATIVE NEGATIVE mg/dL    Bilirubin Urine NEGATIVE NEGATIVE   Ketones, ur NEGATIVE NEGATIVE mg/dL   Specific Gravity, Urine 1.025 1.005 - 1.030   Hgb urine dipstick NEGATIVE NEGATIVE   pH 7.0 5.0 - 8.0   Protein, ur NEGATIVE NEGATIVE mg/dL   Urobilinogen, UA 0.2 0.0 - 1.0 mg/dL   Nitrite NEGATIVE NEGATIVE   Leukocytes, UA NEGATIVE NEGATIVE    Assessment and Plan :   Testicular pain, right  Epididymitis  Will start patient on doxycycline to address epididymitis given history of good results with this. Labs pending.    Wallis Bamberg, PA-C 06/18/17 1355

## 2017-06-18 NOTE — ED Triage Notes (Signed)
Testicular pain for 6 days, history of the same and seen at [facility]

## 2017-06-18 NOTE — Discharge Instructions (Signed)
Hydrate well with at least 2 liters (1 gallon) of water daily. You may take 500mg Tylenol every 6 hours for pain and inflammation.  °

## 2017-06-19 LAB — URINE CULTURE: CULTURE: NO GROWTH

## 2017-06-20 LAB — URINE CYTOLOGY ANCILLARY ONLY
CHLAMYDIA, DNA PROBE: NEGATIVE
Neisseria Gonorrhea: NEGATIVE
Trichomonas: NEGATIVE

## 2017-07-06 ENCOUNTER — Encounter (HOSPITAL_COMMUNITY): Payer: Self-pay | Admitting: Family Medicine

## 2017-07-06 ENCOUNTER — Ambulatory Visit (HOSPITAL_COMMUNITY)
Admission: EM | Admit: 2017-07-06 | Discharge: 2017-07-06 | Disposition: A | Discharge status: Home / Self Care | Payer: Medicaid Other | Attending: Family Medicine | Admitting: Family Medicine

## 2017-07-06 DIAGNOSIS — M62830 Muscle spasm of back: Secondary | ICD-10-CM

## 2017-07-06 DIAGNOSIS — W57XXXA Bitten or stung by nonvenomous insect and other nonvenomous arthropods, initial encounter: Secondary | ICD-10-CM

## 2017-07-06 DIAGNOSIS — S50869A Insect bite (nonvenomous) of unspecified forearm, initial encounter: Secondary | ICD-10-CM

## 2017-07-06 MED ORDER — PREDNISONE 20 MG PO TABS: ORAL_TABLET | ORAL | 0 refills | Status: DC

## 2017-07-06 MED ORDER — CYCLOBENZAPRINE HCL 5 MG PO TABS: 5.0000 mg | ORAL_TABLET | Freq: Every day | ORAL | 0 refills | Status: DC

## 2017-07-06 NOTE — ED Provider Notes (Signed)
[facility]   [id number] 07/06/17 Arrival Time: 1207   SUBJECTIVE:  [name] Pope is a 57 y.o. male who presents to the urgent care with complaint of right scapular sharp pain with movement for several days and diffuse rash from insect bites over the past week.  No fever or falls  The left hip continues to be stiff with no forthcoming orthopedic diagnosis.     Past Medical History:  Diagnosis Date  . Allergy   . Anginal pain (HCC)    CP for 15-35 seconds X 2 04/2013  . Appendicitis   . Arthritis   . Asthma   . Frequent urination at night   . Headache(784.0)   . Itching    all over body itching at bedtime  . Lumbar radiculopathy   . Osteoporosis    Family History  Problem Relation Age of Onset  . Diabetes Mother   . Diabetes Father   . Hypertension Father    Social History   Socioeconomic History  . Marital status: Single    Spouse name: Not on file  . Number of children: 5  . Years of education: Not on file  . Highest education level: Not on file  Occupational History  . Occupation: disabled  Social Needs  . Financial resource strain: Not on file  . Food insecurity:    Worry: Not on file    Inability: Not on file  . Transportation needs:    Medical: Not on file    Non-medical: Not on file  Tobacco Use  . Smoking status: Never Smoker  . Smokeless tobacco: Never Used  Substance and Sexual Activity  . Alcohol use: Yes    Alcohol/week: 0.6 oz    Types: 1 Cans of beer per week    Comment: daily  BEER  LIQUOR OCC  . Drug use: No  . Sexual activity: Not on file  Lifestyle  . Physical activity:    Days per week: Not on file    Minutes per session: Not on file  . Stress: Not on file  Relationships  . Social connections:    Talks on phone: Not on file    Gets together: Not on file    Attends religious service: Not on file    Active member of club or organization: Not on file    Attends meetings of clubs or organizations: Not on file   Relationship status: Not on file  . Intimate partner violence:    Fear of current or ex partner: Not on file    Emotionally abused: Not on file    Physically abused: Not on file    Forced sexual activity: Not on file  Other Topics Concern  . Not on file  Social History Narrative  . Not on file   No outpatient medications have been marked as taking for the 07/06/17 encounter [facility] Encounter).   Allergies  Allergen Reactions  . Penicillins     Childhood Has patient had a PCN reaction causing immediate rash, facial/tongue/throat swelling, SOB or lightheadedness with hypotension: n/a Has patient had a PCN reaction causing severe rash involving mucus membranes or skin necrosis: n/a Has patient had a PCN reaction that required hospitalization: n/a Has patient had a PCN reaction occurring within the last 10 years: n/a If all of the above answers are "NO", then may proceed with Cephalosporin use.       ROS: As per HPI, remainder of ROS negative.   OBJECTIVE:   Vitals:  07/06/17 1239  BP: (!) 126/93  Pulse: 81  Resp: 18  Temp: 98 F (36.7 C)  SpO2: 96%     General appearance: alert; no distress Eyes: PERRL; EOMI; conjunctiva normal HENT: normocephalic; atraumatic; ; oral mucosa normal Neck: supple Lungs: clear to auscultation bilaterally Heart: regular rate and rhythm Back: no CVA tenderness Extremities: no cyanosis or edema; symmetrical with no gross deformities Skin: warm and dry; multiple random papules on torso and extremities Neurologic: normal gait; grossly normal Psychological: alert and cooperative; normal mood and affect      Labs:  Results for orders placed or performed during the hospital encounter of 06/18/17  Urine culture  Result Value Ref Range   Specimen Description URINE, RANDOM    Special Requests NONE    Culture      NO GROWTH Performed at [facility], [address] [place], [place] [id number]    Report Status 06/19/2017  FINAL   POCT urinalysis dip (device)  Result Value Ref Range   Glucose, UA NEGATIVE NEGATIVE mg/dL   Bilirubin Urine NEGATIVE NEGATIVE   Ketones, ur NEGATIVE NEGATIVE mg/dL   Specific Gravity, Urine 1.025 1.005 - 1.030   Hgb urine dipstick NEGATIVE NEGATIVE   pH 7.0 5.0 - 8.0   Protein, ur NEGATIVE NEGATIVE mg/dL   Urobilinogen, UA 0.2 0.0 - 1.0 mg/dL   Nitrite NEGATIVE NEGATIVE   Leukocytes, UA NEGATIVE NEGATIVE  Urine cytology ancillary only  Result Value Ref Range   Chlamydia Negative    Neisseria gonorrhea Negative    Trichomonas Negative     Labs Reviewed - No data to display  No results found.     ASSESSMENT & PLAN:  1. Muscle spasm of back   2. Insect bite of forearm, unspecified laterality, initial encounter     Meds ordered this encounter  Medications  . predniSONE (DELTASONE) 20 MG tablet    Sig: Two daily with food    Dispense:  10 tablet    Refill:  0  . cyclobenzaprine (FLEXERIL) 5 MG tablet    Sig: Take 1 tablet (5 mg total) by mouth at bedtime.    Dispense:  7 tablet    Refill:  0    Reviewed expectations re: course of current medical issues. Questions answered. Outlined signs and symptoms indicating need for more acute intervention. Patient verbalized understanding. After Visit Summary given.    Procedures:      Elvina SidleLauenstein, Smita Lesh, MD 07/06/17 1249

## 2017-07-06 NOTE — ED Triage Notes (Addendum)
C/O intermittent right mid-back spasms x approx 1 wk without known injury.  Also c/o "breaking out in rash" since fishing few days ago.

## 2017-07-06 NOTE — Discharge Instructions (Addendum)
Stretch arms and spine gently several times a day.

## 2017-12-07 ENCOUNTER — Ambulatory Visit (HOSPITAL_COMMUNITY)
Admission: EM | Admit: 2017-12-07 | Discharge: 2017-12-07 | Disposition: A | Discharge status: Home / Self Care | Payer: Medicaid Other | Attending: Radiology | Admitting: Radiology

## 2017-12-07 ENCOUNTER — Other Ambulatory Visit: Payer: Self-pay

## 2017-12-07 ENCOUNTER — Encounter (HOSPITAL_COMMUNITY): Payer: Self-pay | Admitting: Emergency Medicine

## 2017-12-07 DIAGNOSIS — M545 Low back pain, unspecified: Secondary | ICD-10-CM

## 2017-12-07 MED ORDER — CYCLOBENZAPRINE HCL 5 MG PO TABS: 5.0000 mg | ORAL_TABLET | Freq: Three times a day (TID) | ORAL | 0 refills | Status: AC

## 2017-12-07 MED ORDER — CYCLOBENZAPRINE HCL 5 MG PO TABS: 5.0000 mg | ORAL_TABLET | Freq: Every day | ORAL | 0 refills | Status: DC

## 2017-12-07 MED ORDER — BENZONATATE 100 MG PO CAPS: 100.0000 mg | ORAL_CAPSULE | Freq: Three times a day (TID) | ORAL | 0 refills | Status: DC

## 2017-12-07 MED ORDER — FLUTICASONE PROPIONATE 50 MCG/ACT NA SUSP: 1.0000 | Freq: Every day | NASAL | 2 refills | Status: DC

## 2017-12-07 NOTE — ED Provider Notes (Addendum)
[facility]    CSN: [id number] Arrival date & time: 12/07/17  1247     History   Chief Complaint Chief Complaint  Patient presents with  . Back Pain    HPI [name] Pope is a 57 y.o. male.   Patient presents with nonproductive cough subjective fever and back pain x1 day.  Patient is persistent in nature.  Condition is made better by nothing.  Condition is made worse by nothing.  Condition denies treatment prior to arrival at this facility.  Patient reports that his back pain is worse with movement cough and deep breathing.  Patient is concerned for pneumonia but declines having chest x-ray at this time.  Patient denies any nausea vomiting fevers or sick contacts. Patient denies any radiation of back pain, pain with urination.      Past Medical History:  Diagnosis Date  . Allergy   . Anginal pain (HCC)    CP for 15-35 seconds X 2 04/2013  . Appendicitis   . Arthritis   . Asthma   . Frequent urination at night   . Headache(784.0)   . Itching    all over body itching at bedtime  . Lumbar radiculopathy   . Osteoporosis     Patient Active Problem List   Diagnosis Date Noted  . Syncope and collapse 05/21/2015  . DJD (degenerative joint disease) 05/12/2013  . Hip pain, chronic 11/19/2011    Past Surgical History:  Procedure Laterality Date  . APPENDECTOMY    . 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 TOTAL HIP ARTHROPLASTY Left 05/12/2013   DR MURPHY  . TOTAL HIP ARTHROPLASTY Left 05/12/2013   Procedure: LEFT TOTAL HIP ARTHROPLASTY ANTERIOR APPROACH;  Surgeon: Sheral Apleyimothy D Murphy, MD;  Location: [facility];  Service: Orthopedics;  Laterality: Left;       Home Medications    Prior to Admission medications   Medication Sig Start Date End Date Taking? Authorizing Provider  albuterol (PROVENTIL  HFA;VENTOLIN HFA) 108 (90 BASE) MCG/ACT inhaler Inhale 2 puffs into the lungs every 4 (four) hours as needed for wheezing or shortness of breath. 05/23/14   Charm RingsHonig, Erin J, MD  atorvastatin (LIPITOR) 40 MG tablet Take 40 mg by mouth daily. 04/01/15   [provider]  benzonatate (TESSALON) 100 MG capsule Take 1 capsule (100 mg total) by mouth every 8 (eight) hours. 12/07/17   Alene Miresmohundro, Jennifer C, NP  cyclobenzaprine (FLEXERIL) 5 MG tablet Take 1 tablet (5 mg total) by mouth 3 (three) times daily for 5 days. 12/07/17 12/12/17  Alene Miresmohundro, Jennifer C, NP  fluticasone (FLONASE) 50 MCG/ACT nasal spray Place 1 spray into both nostrils daily. 12/07/17   Alene Miresmohundro, Jennifer C, NP  gabapentin (NEURONTIN) 800 MG tablet Take 800 mg by mouth 3 (three) times daily.     [provider]  metFORMIN (GLUCOPHAGE) 500 MG tablet Take 500 mg by mouth 2 (two) times daily. 04/08/15   [provider]  predniSONE (DELTASONE) 20 MG tablet Two daily with food 07/06/17   Elvina SidleLauenstein, Kurt, MD    Family History Family History  Problem Relation Age of Onset  . Diabetes Mother   . Diabetes Father   . Hypertension Father     Social History Social History   Tobacco Use  . Smoking status: Never Smoker  . Smokeless tobacco: Never Used  Substance  Use Topics  . Alcohol use: Yes    Alcohol/week: 1.0 standard drinks    Types: 1 Cans of beer per week    Comment: daily  BEER  LIQUOR OCC  . Drug use: No     Allergies   Penicillins   Review of Systems Review of Systems  Constitutional: Positive for fever ( subjective).  HENT: Positive for sneezing.   Respiratory: Positive for cough ( non productive).   Musculoskeletal: Positive for back pain ( lower back worse with palpation sneezing, coughing and movement).     Physical Exam Triage Vital Signs ED Triage Vitals  Enc Vitals Group     BP 12/07/17 1416 130/73     Pulse Rate 12/07/17 1416 89     Resp 12/07/17 1416 20     Temp 12/07/17  1416 98.2 F (36.8 C)     Temp Source 12/07/17 1416 Oral     SpO2 12/07/17 1416 100 %     Weight --      Height --      Head Circumference --      Peak Flow --      Pain Score 12/07/17 1414 10     Pain Loc --      Pain Edu? --      Excl. in GC? --    No data found.  Updated Vital Signs BP 130/73 (BP Location: Left Arm)   Pulse 89   Temp 98.2 F (36.8 C) (Oral)   Resp 20   SpO2 100%   Visual Acuity Right Eye Distance:   Left Eye Distance:   Bilateral Distance:    Right Eye Near:   Left Eye Near:    Bilateral Near:     Physical Exam  Constitutional: He is oriented to person, place, and time. He appears well-developed and well-nourished.  HENT:  Head: Normocephalic.  Right Ear: External ear normal.  Left Ear: External ear normal.  Neck: Normal range of motion.  Cardiovascular: Normal rate and regular rhythm.  Pulmonary/Chest: Effort normal and breath sounds normal.  Musculoskeletal: Normal range of motion. He exhibits tenderness ( to lower back with palpation ).  Neurological: He is alert and oriented to person, place, and time.  Skin: Skin is warm and dry.  Psychiatric: He has a normal mood and affect.  Nursing note and vitals reviewed.    UC Treatments / Results  Labs (all labs ordered are listed, but only abnormal results are displayed) Labs Reviewed - No data to display  EKG None  Radiology No results found.  Procedures Procedures (including critical care time)  Medications Ordered in UC Medications - No data to display  Initial Impression / Assessment and Plan / UC Course  I have reviewed the triage vital signs and the nursing notes.  Pertinent labs & imaging results that were available during my care of the patient were reviewed by me and considered in my medical decision making (see chart for details).      Final Clinical Impressions(s) / UC Diagnoses   Final diagnoses:  None   Discharge Instructions   None    ED Prescriptions     Medication Sig Dispense Auth. Provider   cyclobenzaprine (FLEXERIL) 5 MG tablet  (Status: Discontinued) Take 1 tablet (5 mg total) by mouth at bedtime. 7 tablet Anders Grant C, NP   benzonatate (TESSALON) 100 MG capsule Take 1 capsule (100 mg total) by mouth every 8 (eight) hours. 21 capsule Alene Mires, NP  fluticasone (FLONASE) 50 MCG/ACT nasal spray Place 1 spray into both nostrils daily. 16 g Alene Mires, NP   cyclobenzaprine (FLEXERIL) 5 MG tablet Take 1 tablet (5 mg total) by mouth 3 (three) times daily for 5 days. 15 tablet Alene Mires, NP     Controlled Substance Prescriptions  Controlled Substance Registry consulted? Not Applicable   Alene Mires, NP 12/07/17 1457    Alene Mires, NP 12/07/17 1458

## 2017-12-07 NOTE — Discharge Instructions (Signed)
Continue to push fluids and take over the counter medications as directed on the back of the box for symptomatic relief.  ° °

## 2017-12-07 NOTE — ED Triage Notes (Signed)
Back pain started hurting around 11:30 pm yesterday evening.  Pain in left upper back.  Movement makes pain worse.  Patient concerned for pneumonia.  Coughing makes this area of back hurt significantly

## 2017-12-13 ENCOUNTER — Encounter: Payer: Self-pay | Admitting: Family Medicine

## 2017-12-16 ENCOUNTER — Ambulatory Visit (INDEPENDENT_AMBULATORY_CARE_PROVIDER_SITE_OTHER): Payer: Medicaid Other | Admitting: Family Medicine

## 2017-12-16 ENCOUNTER — Encounter: Payer: Self-pay | Admitting: Family Medicine

## 2017-12-16 VITALS — BP 134/88 | HR 80 | Temp 97.4°F | Resp 17 | Ht 70.0 in | Wt 218.6 lb

## 2017-12-16 DIAGNOSIS — M546 Pain in thoracic spine: Secondary | ICD-10-CM

## 2017-12-16 DIAGNOSIS — R059 Cough, unspecified: Secondary | ICD-10-CM

## 2017-12-16 DIAGNOSIS — M1990 Unspecified osteoarthritis, unspecified site: Secondary | ICD-10-CM | POA: Diagnosis not present

## 2017-12-16 DIAGNOSIS — R05 Cough: Secondary | ICD-10-CM

## 2017-12-16 DIAGNOSIS — N401 Enlarged prostate with lower urinary tract symptoms: Secondary | ICD-10-CM

## 2017-12-16 DIAGNOSIS — G8929 Other chronic pain: Secondary | ICD-10-CM

## 2017-12-16 DIAGNOSIS — F10920 Alcohol use, unspecified with intoxication, uncomplicated: Secondary | ICD-10-CM | POA: Insufficient documentation

## 2017-12-16 DIAGNOSIS — N138 Other obstructive and reflux uropathy: Secondary | ICD-10-CM

## 2017-12-16 DIAGNOSIS — E1169 Type 2 diabetes mellitus with other specified complication: Secondary | ICD-10-CM

## 2017-12-16 DIAGNOSIS — H547 Unspecified visual loss: Secondary | ICD-10-CM

## 2017-12-16 DIAGNOSIS — R51 Headache: Secondary | ICD-10-CM

## 2017-12-16 DIAGNOSIS — E785 Hyperlipidemia, unspecified: Secondary | ICD-10-CM | POA: Diagnosis not present

## 2017-12-16 LAB — POCT URINALYSIS DIP (CLINITEK)
Bilirubin, UA: NEGATIVE
GLUCOSE UA: NEGATIVE mg/dL
Ketones, POC UA: NEGATIVE mg/dL
Leukocytes, UA: NEGATIVE
Nitrite, UA: NEGATIVE
POC PROTEIN,UA: NEGATIVE
RBC UA: NEGATIVE
Urobilinogen, UA: 0.2 E.U./dL
pH, UA: 6 (ref 5.0–8.0)

## 2017-12-16 MED ORDER — ALBUTEROL SULFATE HFA 108 (90 BASE) MCG/ACT IN AERS: 2.0000 | INHALATION_SPRAY | RESPIRATORY_TRACT | 0 refills | Status: DC | PRN

## 2017-12-16 MED ORDER — ALBUTEROL SULFATE HFA 108 (90 BASE) MCG/ACT IN AERS: 2.0000 | INHALATION_SPRAY | RESPIRATORY_TRACT | 2 refills | Status: DC | PRN

## 2017-12-16 MED ORDER — ATORVASTATIN CALCIUM 40 MG PO TABS: 40.0000 mg | ORAL_TABLET | Freq: Every day | ORAL | 1 refills | Status: AC

## 2017-12-16 MED ORDER — FLUTICASONE PROPIONATE (INHAL) 50 MCG/BLIST IN AEPB: 1.0000 | INHALATION_SPRAY | Freq: Two times a day (BID) | RESPIRATORY_TRACT | 12 refills | Status: DC

## 2017-12-16 MED ORDER — GABAPENTIN 800 MG PO TABS: 800.0000 mg | ORAL_TABLET | Freq: Three times a day (TID) | ORAL | 1 refills | Status: DC

## 2017-12-16 MED ORDER — MONTELUKAST SODIUM 10 MG PO TABS: 10.0000 mg | ORAL_TABLET | Freq: Every day | ORAL | 3 refills | Status: DC

## 2017-12-16 MED ORDER — METFORMIN HCL 500 MG PO TABS: 500.0000 mg | ORAL_TABLET | Freq: Two times a day (BID) | ORAL | 1 refills | Status: AC

## 2017-12-16 MED ORDER — BUTALBITAL-APAP-CAFFEINE 50-325-40 MG PO TABS: 1.0000 | ORAL_TABLET | Freq: Four times a day (QID) | ORAL | 1 refills | Status: DC | PRN

## 2017-12-16 NOTE — Progress Notes (Signed)
Arihant Pennings, is a 57 y.o. male  ZOX:[id number]  WJX:[id number]  DOB - 1960/11/13  CC: No chief complaint on file.      HPI: Jylan is a 57 y.o. male is here today to establish care.   Kinnie Feil has Hip pain, chronic; DJD (degenerative joint disease); and Benign prostatic hyperplasia with urinary obstruction on their problem list.    Today's visit:  Patient presents today to establish care.  He was recently seen at [facility] urgent care with a complaint of a persistent cough and ongoing back pain.  Some concern for pneumonia however patient declined to have a chest x-ray performed.  He was seen on 12/07/2017.  Patient is a non-smoker.  Echo history significant for  type 2 diabetes, hypertension and hyperlipidemia.  He was placed on an albuterol inhaler, treated cough with Tessalon Pearls, and cyclobenzaprine for back pain.  He was advised to follow-up here today.  Patient reports a history of asthma which is moderately controlled.  He reports asthma exacerbations are associated with changes in seasons as he mostly suffers from exacerbations during the winter months.  At present he is using his albuterol inhaler at least twice daily.  He is not prescribed a maintenance inhaler.  Reports some associated shortness of breath and occasional wheezing.  Symptoms appear to be worsened at night.  He is not currently prescribed an antihistamine.  No prior work-up or evaluation by pulmonology.  Chronic pain Suffers from chronic degenerative disease affecting bilateral hips, low back pain , and generalized joint pain.  He has been under the care of [facility] for primary care and pain management, although has not been followed by them for sometime. He has been prescribed chronic pain medication and he thinks the last medication was oxycodone.  He has undergone a bilateral hip replacement approximately 5 years ago.  He also has suffered some chronic back pain and has had MRI imaging of his back  which was significant for a minimal bulging discaffecting the L4 and the L5 region of the lumbar spine and degenerative disc disease of the cervical spine C3-C5.  He has not had any additional follow-up regarding his chronic back pain.  He has been seen periodically at urgent cares for management of acute episodes of chronic back pain.  Headaches Patient complains of chronic bilateral headaches.  This is been ongoing for some time.  He endorses worsening visual acuity and has not had a recent eye exam.  He also suffers from type 2 diabetes and wonder if that is related to headaches as well as decreased visual acuity.  He wears readers which somewhat improves his vision.  Headaches occur almost daily and respond after multiple doses of ibuprofen.  He is concerned for taking so much ibuprofen as this could affect his kidneys.  He denies any weakness, dizziness, or blurring of vision associated with the headaches.   Diabetes  Diagnosed with type 2 diabetes over 5 years ago.  He does not monitor his blood sugar at home.  Diabetes has been managed on metformin 500 mg twice daily since his diagnosis.  He is uncertain of what his last hemoglobin A1c was.  Reports no recent blood work for evaluation of diabetes.  He denies polyuria, polydipsia, polyphagia.  He denies any known episodes of hypoglycemia.  He reports efforts to walk for exercise.  He does not adhere to a restrictive diet.  He has a family history significant for type 2 diabetes in his father.  His  father died from complications of diabetes associated with renal failure.  He has a history of hyperlipidemia and had been prescribed atorvastatin 40 mg once daily although notes that he has not consistently taken his medication as he has not had a PCP for some time. Patient denies chest pain, shortness of breath, new weakness, worrisome cough, or edema.   Current medications: Current Outpatient Medications:  .  albuterol (PROVENTIL HFA;VENTOLIN HFA) 108  (90 BASE) MCG/ACT inhaler, Inhale 2 puffs into the lungs every 4 (four) hours as needed for wheezing or shortness of breath., Disp: 1 Inhaler, Rfl: 0 .  atorvastatin (LIPITOR) 40 MG tablet, Take 40 mg by mouth daily., Disp: , Rfl:  .  benzonatate (TESSALON) 100 MG capsule, Take 1 capsule (100 mg total) by mouth every 8 (eight) hours., Disp: 21 capsule, Rfl: 0 .  fluticasone (FLONASE) 50 MCG/ACT nasal spray, Place 1 spray into both nostrils daily., Disp: 16 g, Rfl: 2 .  gabapentin (NEURONTIN) 800 MG tablet, Take 800 mg by mouth 3 (three) times daily. , Disp: , Rfl:  .  metFORMIN (GLUCOPHAGE) 500 MG tablet, Take 500 mg by mouth 2 (two) times daily., Disp: , Rfl:  .  predniSONE (DELTASONE) 20 MG tablet, Two daily with food, Disp: 10 tablet, Rfl: 0   Pertinent family medical history: family history includes Diabetes in his father and mother; Hypertension in his father.     Social History   Socioeconomic History  . Marital status: Single    Spouse name: Not on file  . Number of children: 5  . Years of education: Not on file  . Highest education level: Not on file  Occupational History  . Occupation: disabled  Social Needs  . Financial resource strain: Not on file  . Food insecurity:    Worry: Not on file    Inability: Not on file  . Transportation needs:    Medical: Not on file    Non-medical: Not on file  Tobacco Use  . Smoking status: Never Smoker  . Smokeless tobacco: Never Used  Substance and Sexual Activity  . Alcohol use: Yes    Alcohol/week: 1.0 standard drinks    Types: 1 Cans of beer per week    Comment: daily  BEER  LIQUOR OCC  . Drug use: No  . Sexual activity: Not on file  Lifestyle  . Physical activity:    Days per week: Not on file    Minutes per session: Not on file  . Stress: Not on file  Relationships  . Social connections:    Talks on phone: Not on file    Gets together: Not on file    Attends religious service: Not on file    Active member of club or  organization: Not on file    Attends meetings of clubs or organizations: Not on file    Relationship status: Not on file  . Intimate partner violence:    Fear of current or ex partner: Not on file    Emotionally abused: Not on file    Physically abused: Not on file    Forced sexual activity: Not on file  Other Topics Concern  . Not on file  Social History Narrative  . Not on file    Review of Systems: Constitutional: Negative for fever, chills, diaphoresis, activity change, appetite change and fatigue. HENT: Negative for ear pain, nosebleeds, congestion, facial swelling, rhinorrhea, neck pain, neck stiffness and ear discharge.  Eyes: Negative for pain, discharge, redness, itching  and visual disturbance. Respiratory: Negative for cough, choking, chest tightness, shortness of breath, wheezing and stridor.  Cardiovascular: Negative for chest pain, palpitations and leg swelling. Gastrointestinal: Negative for abdominal distention. Genitourinary: Negative for dysuria, urgency, frequency, hematuria, flank pain, decreased urine volume, difficulty urinating. Musculoskeletal: Negative for back pain, joint swelling, arthralgia and gait problem. Neurological: Negative for dizziness, tremors, seizures, syncope, facial asymmetry, speech difficulty, weakness, light-headedness, numbness and headaches.  Hematological: Negative for adenopathy. Does not bruise/bleed easily. Psychiatric/Behavioral: Negative for hallucinations, behavioral problems, confusion, dysphoric mood, decreased concentration and agitation.    Objective:  There were no vitals filed for this visit.  BP Readings from Last 3 Encounters:  12/07/17 130/73  07/06/17 (!) 126/93  06/18/17 121/81    Filed Weights   12/16/17 0945  Weight: 218 lb 9.6 oz (99.2 kg)      Physical Exam: Constitutional: Patient appears well-developed and well-nourished. No distress. HENT: Normocephalic, atraumatic, External right and left ear normal.  Oropharynx is clear and moist.  Eyes: Conjunctivae and EOM are normal. PERRLA, no scleral icterus. Neck: Normal ROM. Neck supple. No JVD. No tracheal deviation. No thyromegaly. CVS: RRR, S1/S2 +, no murmurs, no gallops, no carotid bruit.  Pulmonary: Effort and breath sounds normal, no stridor, rhonchi, wheezes, rales.  Abdominal: Soft. BS +, no distension, tenderness, rebound or guarding.  Musculoskeletal: Normal range of motion. No edema and no tenderness.  Neuro: Alert. Normal muscle tone coordination. Normal gait. BUE and BLE strength 5/5. Bilateral hand grips symmetrical. No cranial nerve deficit. Skin: Skin is warm and dry. No rash noted. Not diaphoretic. No erythema. No pallor. Psychiatric: Normal mood and affect. Behavior, judgment, thought content normal.  Lab Results (prior encounters)  Lab Results  Component Value Date   WBC 5.1 05/19/2016   HGB 13.5 05/19/2016   HCT 41.1 05/19/2016   MCV 88.8 05/19/2016   PLT 238 05/19/2016   Lab Results  Component Value Date   CREATININE 0.98 05/19/2016   BUN 9 05/19/2016   NA 136 05/19/2016   K 3.9 05/19/2016   CL 103 05/19/2016   CO2 27 05/19/2016       Assessment and plan:  1. Type 2 diabetes mellitus with other specified complication, without long-term current use of insulin (HCC) -Aim for 30 minutes of exercise most days, with a goal of 150 minutes per week. -Glucose monitoring at minimal of twice daily and keep a log of readings. -Commit to medication adherence and self-adjustment as needed -increase foods containing whole grains (one-half of grain intake). -saturated fat intake should be reduced -reduce intake of trans fat (lowers LDL cholesterol and increases HDL cholesterol) -Eat 4-5 small meals during the day to reduce the risk of becoming hungry. Checking the following:  - Hemoglobin A1c - POCT URINALYSIS DIP (CLINITEK) - Comprehensive metabolic panel Referred for diabetic eye exam:  - Ambulatory referral to  Ophthalmology  Continue Metformin 500 mg twice daily. Will adjust medication if warranted.   2. Hyperlipidemia, unspecified hyperlipidemia type  - Lipid panel - Thyroid Panel With TSH  3. Cough, secondary to asthma  - CBC with Differential  4. Osteoarthritis, unspecified osteoarthritis type, unspecified site - VITAMIN D 25 Hydroxy (Vit-D Deficiency, Fractures) - Ambulatory referral to Pain Clinic - Ambulatory referral to Orthopedic Surgery  5. Benign prostatic hyperplasia with urinary obstruction, hx of  - checking a PSA  6. Decreased visual acuity, likely secondary to diabetes.  - Ambulatory referral to Ophthalmology  7. Chronic bilateral thoracic back pain -Ambulatory referral to  Pain Clinic - Ambulatory referral to Orthopedic Surgery  8. Chronic nonintractable headache, unspecified headache type, uncertain if cause is related to chronic pain vs possible hypertension. Recommended home monitoring of blood pressure. Will continue to monitor at subsequent visits. Will trial PRN Fioricet for headache management.    Meds ordered this encounter  Medications  . fluticasone (FLOVENT DISKUS) 50 MCG/BLIST diskus inhaler    Sig: Inhale 1 puff into the lungs 2 (two) times daily.    Dispense:  1 Inhaler    Refill:  12  . DISCONTD: butalbital-acetaminophen-caffeine (FIORICET, ESGIC) 50-325-40 MG tablet    Sig: Take 1-2 tablets by mouth every 6 (six) hours as needed for headache or migraine (chronic headaches).    Dispense:  60 tablet    Refill:  1  . butalbital-acetaminophen-caffeine (FIORICET, ESGIC) 50-325-40 MG tablet    Sig: Take 1 tablet by mouth every 6 (six) hours as needed for headache or migraine (chronic headaches).    Dispense:  60 tablet    Refill:  1  . DISCONTD: albuterol (PROVENTIL HFA;VENTOLIN HFA) 108 (90 Base) MCG/ACT inhaler    Sig: Inhale 2 puffs into the lungs every 4 (four) hours as needed for wheezing or shortness of breath.    Dispense:  1 Inhaler     Refill:  0  . atorvastatin (LIPITOR) 40 MG tablet    Sig: Take 1 tablet (40 mg total) by mouth daily.    Dispense:  90 tablet    Refill:  1  . gabapentin (NEURONTIN) 800 MG tablet    Sig: Take 1 tablet (800 mg total) by mouth 3 (three) times daily.    Dispense:  90 tablet    Refill:  1  . metFORMIN (GLUCOPHAGE) 500 MG tablet    Sig: Take 1 tablet (500 mg total) by mouth 2 (two) times daily.    Dispense:  180 tablet    Refill:  1  . montelukast (SINGULAIR) 10 MG tablet    Sig: Take 1 tablet (10 mg total) by mouth at bedtime.    Dispense:  30 tablet    Refill:  3  . albuterol (PROVENTIL HFA;VENTOLIN HFA) 108 (90 Base) MCG/ACT inhaler    Sig: Inhale 2 puffs into the lungs every 4 (four) hours as needed for wheezing or shortness of breath.    Dispense:  1 Inhaler    Refill:  2     Return in about 4 weeks (around 01/13/2018) for chronic conditions.   A total of 40 minutes spent, greater than 50 % of this time was spent counseling on medications, reviewing prior records, and coordination of care.  The patient was given clear instructions to go to ER or return to medical center if symptoms don't improve, worsen or new problems develop. The patient verbalized understanding. The patient was advised  to call and obtain lab results if they haven't heard anything from out office within 7-10 business days.  Joaquin Courts, FNP Primary Care at [facility] [address] [place] [address] [phone]: [phone]    This note has been created with Dragon speech recognition software and Paediatric nurse. Any transcriptional errors are unintentional.

## 2017-12-16 NOTE — Patient Instructions (Addendum)
Thank you for choosing Primary Care at [facility] for your medical home!    [name] Pope was seen by Joaquin Courts, FNP today.   Santiago Bumpers Bilger's primary care doctor is Bing Neighbors, FNP.   For the best care possible,  you should try to see Joaquin Courts, FNP-C  whenever you come to clinic.   We look forward to seeing you again soon!  If you have any questions about your visit today,  please call us at [phone]  Or feel free to reach your provider via MyChart.     I am starting you on Fioricet for your headaches.  Continue to monitor blood pressure over the course of the next month and notify me if your blood pressures consistently greater than 140/90 as this would indicate blood pressure is likely the cause of your headaches.  I am also adding fluticasone inhaler as maintenance therapy for control of asthma symptoms.  You will administer 2 puffs twice daily every day to reduce frequency of asthma symptoms. I have also added Singulair 10 mg once daily for prevention of asthma attacks.  As discussed I have placed the referrals.  You should hear from each department you have been referred to within 7 to 10 days.  If it is been greater than 10 days please notify us here the office so that we can follow-up on the referral.  You will be notified by phone of any abnormal results and medication changes.  If all results are within expected range you will receive notification via mail.    Chronic Pain, Adult Chronic pain is a type of pain that lasts or keeps coming back (recurs) for at least six months. You may have chronic headaches, abdominal pain, or body pain. Chronic pain may be related to an illness, such as fibromyalgia or complex regional pain syndrome. Sometimes the cause of chronic pain is not known. Chronic pain can make it hard for you to do daily activities. If not treated, chronic pain can lead to other health problems, including anxiety and depression.  Treatment depends on the cause and severity of your pain. You may need to work with a pain specialist to come up with a treatment plan. The plan may include medicine, counseling, and physical therapy. Many people benefit from a combination of two or more types of treatment to control their pain. Follow these instructions at home: Lifestyle  Consider keeping a pain diary to share with your health care providers.  Consider talking with a mental health care provider (psychologist) about how to cope with chronic pain.  Consider joining a chronic pain support group.  Try to control or lower your stress levels. Talk to your health care provider about strategies to do this. General instructions   Take over-the-counter and prescription medicines only as told by your health care provider.  Follow your treatment plan as told by your health care provider. This may include: ? Gentle, regular exercise. ? Eating a healthy diet that includes foods such as vegetables, fruits, fish, and lean meats. ? Cognitive or behavioral therapy. ? Working with a Adult nurse. ? Meditation or yoga. ? Acupuncture or massage therapy. ? Aroma, color, light, or sound therapy. ? Local electrical stimulation. ? Shots (injections) of numbing or pain-relieving medicines into the spine or the area of pain.  Check your pain level as told by your health care provider. Ask your health care provider if you should use a pain scale.  Learn as much as  you can about how to manage your chronic pain. Ask your health care provider if an intensive pain rehabilitation program or a chronic pain specialist would be helpful.  Keep all follow-up visits as told by your health care provider. This is important. Contact a health care provider if:  Your pain gets worse.  You have new pain.  You have trouble sleeping.  You have trouble doing your normal activities.  Your pain is not controlled with treatment.  Your have side  effects from pain medicine.  You feel weak. Get help right away if:  You lose feeling or have numbness in your body.  You lose control of bowel or bladder function.  Your pain suddenly gets much worse.  You develop shaking or chills.  You develop confusion.  You develop chest pain.  You have trouble breathing or shortness of breath.  You pass out.  You have thoughts about hurting yourself or others. This information is not intended to replace advice given to you by your health care provider. Make sure you discuss any questions you have with your health care provider. Document Released: 09/16/2001 Document Revised: 08/25/2015 Document Reviewed: 06/14/2015 Elsevier Interactive Patient Education  2018 [employer]   Diabetes Mellitus and Nutrition When you have diabetes (diabetes mellitus), it is very important to have healthy eating habits because your blood sugar (glucose) levels are greatly affected by what you eat and drink. Eating healthy foods in the appropriate amounts, at about the same times every day, can help you:  Control your blood glucose.  Lower your risk of heart disease.  Improve your blood pressure.  Reach or maintain a healthy weight.  Every person with diabetes is different, and each person has different needs for a meal plan. Your health care provider may recommend that you work with a diet and nutrition specialist (dietitian) to make a meal plan that is best for you. Your meal plan may vary depending on factors such as:  The calories you need.  The medicines you take.  Your weight.  Your blood glucose, blood pressure, and cholesterol levels.  Your activity level.  Other health conditions you have, such as heart or kidney disease.  How do carbohydrates affect me? Carbohydrates affect your blood glucose level more than any other type of food. Eating carbohydrates naturally increases the amount of glucose in your blood. Carbohydrate counting is a  method for keeping track of how many carbohydrates you eat. Counting carbohydrates is important to keep your blood glucose at a healthy level, especially if you use insulin or take certain oral diabetes medicines. It is important to know how many carbohydrates you can safely have in each meal. This is different for every person. Your dietitian can help you calculate how many carbohydrates you should have at each meal and for snack. Foods that contain carbohydrates include:  Bread, cereal, rice, pasta, and crackers.  Potatoes and corn.  Peas, beans, and lentils.  Milk and yogurt.  Fruit and juice.  Desserts, such as cakes, cookies, ice cream, and candy.  How does alcohol affect me? Alcohol can cause a sudden decrease in blood glucose (hypoglycemia), especially if you use insulin or take certain oral diabetes medicines. Hypoglycemia can be a life-threatening condition. Symptoms of hypoglycemia (sleepiness, dizziness, and confusion) are similar to symptoms of having too much alcohol. If your health care provider says that alcohol is safe for you, follow these guidelines:  Limit alcohol intake to no more than 1 drink per day for nonpregnant  women and 2 drinks per day for men. One drink equals 12 oz of beer, 5 oz of wine, or 1 oz of hard liquor.  Do not drink on an empty stomach.  Keep yourself hydrated with water, diet soda, or unsweetened iced tea.  Keep in mind that regular soda, juice, and other mixers may contain a lot of sugar and must be counted as carbohydrates.  What are tips for following this plan? Reading food labels  Start by checking the serving size on the label. The amount of calories, carbohydrates, fats, and other nutrients listed on the label are based on one serving of the food. Many foods contain more than one serving per package.  Check the total grams (g) of carbohydrates in one serving. You can calculate the number of servings of carbohydrates in one serving by  dividing the total carbohydrates by 15. For example, if a food has 30 g of total carbohydrates, it would be equal to 2 servings of carbohydrates.  Check the number of grams (g) of saturated and trans fats in one serving. Choose foods that have low or no amount of these fats.  Check the number of milligrams (mg) of sodium in one serving. Most people should limit total sodium intake to less than 2,300 mg per day.  Always check the nutrition information of foods labeled as "low-fat" or "nonfat". These foods may be higher in added sugar or refined carbohydrates and should be avoided.  Talk to your dietitian to identify your daily goals for nutrients listed on the label. Shopping  Avoid buying canned, premade, or processed foods. These foods tend to be high in fat, sodium, and added sugar.  Shop around the outside edge of the grocery store. This includes fresh fruits and vegetables, bulk grains, fresh meats, and fresh dairy. Cooking  Use low-heat cooking methods, such as baking, instead of high-heat cooking methods like deep frying.  Cook using healthy oils, such as olive, canola, or sunflower oil.  Avoid cooking with butter, cream, or high-fat meats. Meal planning  Eat meals and snacks regularly, preferably at the same times every day. Avoid going long periods of time without eating.  Eat foods high in fiber, such as fresh fruits, vegetables, beans, and whole grains. Talk to your dietitian about how many servings of carbohydrates you can eat at each meal.  Eat 4-6 ounces of lean protein each day, such as lean meat, chicken, fish, eggs, or tofu. 1 ounce is equal to 1 ounce of meat, chicken, or fish, 1 egg, or 1/4 cup of tofu.  Eat some foods each day that contain healthy fats, such as avocado, nuts, seeds, and fish. Lifestyle   Check your blood glucose regularly.  Exercise at least 30 minutes 5 or more days each week, or as told by your health care provider.  Take medicines as told by  your health care provider.  Do not use any products that contain nicotine or tobacco, such as cigarettes and e-cigarettes. If you need help quitting, ask your health care provider.  Work with a Veterinary surgeon or diabetes educator to identify strategies to manage stress and any emotional and social challenges. What are some questions to ask my health care provider?  Do I need to meet with a diabetes educator?  Do I need to meet with a dietitian?  What number can I call if I have questions?  When are the best times to check my blood glucose? Where to find more information:  American Diabetes Association:  diabetes.org/food-and-fitness/food  Academy of Nutrition and Dietetics: https://www.vargas.com/  [employer] of Diabetes and Digestive and Kidney Diseases (NIH): FindJewelers.cz Summary  A healthy meal plan will help you control your blood glucose and maintain a healthy lifestyle.  Working with a diet and nutrition specialist (dietitian) can help you make a meal plan that is best for you.  Keep in mind that carbohydrates and alcohol have immediate effects on your blood glucose levels. It is important to count carbohydrates and to use alcohol carefully. This information is not intended to replace advice given to you by your health care provider. Make sure you discuss any questions you have with your health care provider. Document Released: 09/21/2004 Document Revised: 01/30/2016 Document Reviewed: 01/30/2016 Elsevier Interactive Patient Education  [employer].

## 2017-12-17 LAB — CBC WITH DIFFERENTIAL/PLATELET
BASOS: 0 %
Basophils Absolute: 0 10*3/uL (ref 0.0–0.2)
EOS (ABSOLUTE): 0 10*3/uL (ref 0.0–0.4)
Eos: 1 %
HEMATOCRIT: 38.9 % (ref 37.5–51.0)
HEMOGLOBIN: 12.8 g/dL — AB (ref 13.0–17.7)
IMMATURE GRANS (ABS): 0 10*3/uL (ref 0.0–0.1)
Immature Granulocytes: 0 %
LYMPHS ABS: 1.7 10*3/uL (ref 0.7–3.1)
LYMPHS: 44 %
MCH: 29.2 pg (ref 26.6–33.0)
MCHC: 32.9 g/dL (ref 31.5–35.7)
MCV: 89 fL (ref 79–97)
MONOCYTES: 12 %
Monocytes Absolute: 0.5 10*3/uL (ref 0.1–0.9)
Neutrophils Absolute: 1.7 10*3/uL (ref 1.4–7.0)
Neutrophils: 43 %
Platelets: 307 10*3/uL (ref 150–450)
RBC: 4.38 x10E6/uL (ref 4.14–5.80)
RDW: 14.5 % (ref 12.3–15.4)
WBC: 3.9 10*3/uL (ref 3.4–10.8)

## 2017-12-17 LAB — COMPREHENSIVE METABOLIC PANEL
ALK PHOS: 61 IU/L (ref 39–117)
ALT: 16 IU/L (ref 0–44)
AST: 16 IU/L (ref 0–40)
Albumin/Globulin Ratio: 1.3 (ref 1.2–2.2)
Albumin: 4.2 g/dL (ref 3.5–5.5)
BUN/Creatinine Ratio: 14 (ref 9–20)
BUN: 11 mg/dL (ref 6–24)
Bilirubin Total: 0.2 mg/dL (ref 0.0–1.2)
CALCIUM: 9 mg/dL (ref 8.7–10.2)
CO2: 19 mmol/L — AB (ref 20–29)
CREATININE: 0.79 mg/dL (ref 0.76–1.27)
Chloride: 101 mmol/L (ref 96–106)
GFR calc Af Amer: 115 mL/min/{1.73_m2} (ref >59)
GFR, EST NON AFRICAN AMERICAN: 100 mL/min/{1.73_m2} (ref >59)
GLUCOSE: 99 mg/dL (ref 65–99)
Globulin, Total: 3.2 g/dL (ref 1.5–4.5)
Potassium: 3.9 mmol/L (ref 3.5–5.2)
Sodium: 137 mmol/L (ref 134–144)
Total Protein: 7.4 g/dL (ref 6.0–8.5)

## 2017-12-17 LAB — LIPID PANEL
CHOL/HDL RATIO: 5.6 ratio — AB (ref 0.0–5.0)
Cholesterol, Total: 186 mg/dL (ref 100–199)
HDL: 33 mg/dL — ABNORMAL LOW (ref >39)
LDL CALC: 130 mg/dL — AB (ref 0–99)
Triglycerides: 116 mg/dL (ref 0–149)
VLDL CHOLESTEROL CAL: 23 mg/dL (ref 5–40)

## 2017-12-17 LAB — THYROID PANEL WITH TSH
Free Thyroxine Index: 1.7 (ref 1.2–4.9)
T3 UPTAKE RATIO: 26 % (ref 24–39)
T4 TOTAL: 6.5 ug/dL (ref 4.5–12.0)
TSH: 1.31 u[IU]/mL (ref 0.450–4.500)

## 2017-12-17 LAB — PSA: PROSTATE SPECIFIC AG, SERUM: 1 ng/mL (ref 0.0–4.0)

## 2017-12-17 LAB — HEMOGLOBIN A1C
ESTIMATED AVERAGE GLUCOSE: 146 mg/dL
Hgb A1c MFr Bld: 6.7 % — ABNORMAL HIGH (ref 4.8–5.6)

## 2017-12-17 LAB — VITAMIN D 25 HYDROXY (VIT D DEFICIENCY, FRACTURES): Vit D, 25-Hydroxy: 21.1 ng/mL — ABNORMAL LOW (ref 30.0–100.0)

## 2017-12-24 MED ORDER — VITAMIN D (ERGOCALCIFEROL) 1.25 MG (50000 UNIT) PO CAPS
50000.0000 [IU] | ORAL_CAPSULE | ORAL | 1 refills | Status: DC
Start: 2017-12-24 — End: 2018-07-09

## 2017-12-24 NOTE — Addendum Note (Signed)
Addended by: Bing NeighborsHARRIS, Yuritzy Zehring S on: 12/24/2017 07:21 AM   Modules accepted: Orders

## 2017-12-26 NOTE — Progress Notes (Signed)
Patient notified of results & recommendations. Expressed understanding.

## 2018-01-06 ENCOUNTER — Ambulatory Visit (INDEPENDENT_AMBULATORY_CARE_PROVIDER_SITE_OTHER): Payer: Medicaid Other

## 2018-01-06 ENCOUNTER — Ambulatory Visit (INDEPENDENT_AMBULATORY_CARE_PROVIDER_SITE_OTHER): Payer: Medicaid Other | Admitting: Orthopaedic Surgery

## 2018-01-06 ENCOUNTER — Encounter (INDEPENDENT_AMBULATORY_CARE_PROVIDER_SITE_OTHER): Payer: Self-pay | Admitting: Orthopaedic Surgery

## 2018-01-06 VITALS — BP 127/106 | HR 89 | Ht 69.0 in | Wt 217.0 lb

## 2018-01-06 DIAGNOSIS — M25551 Pain in right hip: Secondary | ICD-10-CM

## 2018-01-06 DIAGNOSIS — M545 Low back pain, unspecified: Secondary | ICD-10-CM

## 2018-01-06 DIAGNOSIS — M25552 Pain in left hip: Secondary | ICD-10-CM

## 2018-01-06 MED ORDER — METHYLPREDNISOLONE ACETATE 40 MG/ML IJ SUSP
80.0000 mg | INTRAMUSCULAR | Status: AC | PRN
  Administered 2018-01-06: 80 mg

## 2018-01-06 MED ORDER — LIDOCAINE HCL 1 % IJ SOLN
2.0000 mL | INTRAMUSCULAR | Status: AC | PRN
  Administered 2018-01-06: 2 mL

## 2018-01-06 MED ORDER — BUPIVACAINE HCL 0.5 % IJ SOLN
2.0000 mL | INTRAMUSCULAR | Status: AC | PRN
  Administered 2018-01-06: 2 mL via INTRA_ARTICULAR

## 2018-01-06 NOTE — Progress Notes (Signed)
Office Visit Note   Patient: [name] Pope           Date of Birth: 1960/11/10           MRN: [id number] Visit Date: 01/06/2018              Requested by: [name] Pope, [name] S, FNP [address] [place], [place] [id number] PCP: [name] Pope, [name] S, FNP   Assessment & Plan: Visit Diagnoses:  1. Low back pain, unspecified back pain laterality, unspecified chronicity, unspecified whether sciatica present   2. Pain of both hip joints     Plan: Mild degenerative arthrosis lumbar spine.  We will try a course of physical therapy.  Lateral left hip pain.  Could be bursitis or scar from his prior hip surgery.  Will inject with cortisone and monitor response in the next month Follow-Up Instructions: Return in about 1 month (around 02/06/2018).   Orders:  Orders Placed This Encounter  Procedures  . XR Lumbar Spine 2-3 Views  . XR Pelvis 1-2 Views  . Ambulatory referral to Physical Therapy   No orders of the defined types were placed in this encounter.     Procedures: Large Joint Inj: L greater trochanter on 01/06/2018 4:10 PM Indications: pain and diagnostic evaluation Details: 25 G 1.5 in needle, lateral approach  Arthrogram: No  Medications: 2 mL lidocaine 1 %; 2 mL bupivacaine 0.5 %; 80 mg methylPREDNISolone acetate 40 MG/ML Procedure, treatment alternatives, risks and benefits explained, specific risks discussed. Consent was given by the patient. Immediately prior to procedure a time out was called to verify the correct patient, procedure, equipment, support staff and site/side marked as required. Patient was prepped and draped in the usual sterile fashion.       Clinical Data: No additional findings.   Subjective: Chief Complaint  Patient presents with  . Right Hip - Pain  . Left Hip - Pain  . Lower Back - Pain  [name] Pope is 57 years old and visited the office evaluation of low back and left hip pain.  His past history is significant at that he is had  bilateral total hip replacements performed through an anterior approach to by [name] Pope.  He also has had some chronic issues with his lumbar spine.  He had an MRI scan performed in 2015 demonstrating some mild degenerative arthritis.  He recently visited the emergency room with acute onset of low back pain associated with some lateral left hip pain.  No injury or trauma.  He is on disability for his hips.  He does have some difficulty when he sits for long period of time he is not had groin pain but some lateral left hip pain associated with the back discomfort.  Numbness or tingling.  He is diabetic.  Taking metformin and gabapentin  HPI  Review of Systems   Objective: Vital Signs: BP (!) 127/106   Pulse 89   Ht 5\' 9"  (1.753 m)   Wt 217 lb (98.4 kg)   BMI 32.05 kg/m   Physical Exam Constitutional:      Appearance: He is well-developed.  Eyes:     Pupils: Pupils are equal, round, and reactive to light.  Pulmonary:     Effort: Pulmonary effort is normal.  Skin:    General: Skin is warm and dry.  Neurological:     Mental Status: He is alert and oriented to person, place, and time.  Psychiatric:        Behavior:  Behavior normal.     Ortho Exam awake alert and oriented x3.  Comfortable sitting.  Does have some local tenderness over the lateral aspect of his hip in the area of the lower anterior incision and the greater trochanter.  No crepitation.  No evidence of cellulitis.  No specific groin pain with internal or external rotation of either right or left hip.  Straight leg raise negative.  Very minimal percussible tenderness of the lower lumbar spine.  Motor exam intact.  Walks without a limp  Specialty Comments:  No specialty comments available.  Imaging: Xr Lumbar Spine 2-3 Views  Result Date: 01/06/2018 The lumbar spine obtained in 2 projections.  There is some mild facet sclerosis at L5-S1.  No listhesis.  Disc spaces appear to be well-maintained.  There is a very minimal  left-sided degenerative scoliosis  Xr Pelvis 1-2 Views  Result Date: 01/06/2018 AP of the pelvis was performed demonstrating bilateral total hip replacements performed through an anterior approach.  No obvious complications.  No eccentricity.  No ectopic calcification.    PMFS History: Patient Active Problem List   Diagnosis Date Noted  . Alcohol intoxication, uncomplicated (HCC) 12/16/2017  . Benign prostatic hyperplasia with urinary obstruction 05/12/2015  . DJD (degenerative joint disease) 05/12/2013  . Hip pain, chronic 11/19/2011   Past Medical History:  Diagnosis Date  . Allergy   . Arthritis   . Asthma   . BPH with urinary obstruction   . History of appendicitis   . Lumbar radiculopathy   . Osteoporosis     Family History  Problem Relation Age of Onset  . Diabetes Mother   . Diabetes Father   . Hypertension Father     Past Surgical History:  Procedure Laterality Date  . APPENDECTOMY    . TOTAL HIP ARTHROPLASTY Right 10/22/2012   Procedure: RIGHT TOTAL HIP ARTHROPLASTY ANTERIOR APPROACH;  Surgeon: [name] Apleyimothy D Murphy, MD;  Location: [facility] OR;  Service: Orthopedics;  Laterality: Right;  . TOTAL HIP ARTHROPLASTY Left 05/12/2013   Procedure: LEFT TOTAL HIP ARTHROPLASTY ANTERIOR APPROACH;  Surgeon: [name] Apleyimothy D Murphy, MD;  Location: [facility] OR;  Service: Orthopedics;  Laterality: Left;   Social History   Occupational History  . Occupation: disabled  Tobacco Use  . Smoking status: Never Smoker  . Smokeless tobacco: Never Used  Substance and Sexual Activity  . Alcohol use: Yes    Alcohol/week: 1.0 standard drinks    Types: 1 Cans of beer per week    Comment: daily  BEER  LIQUOR OCC  . Drug use: No  . Sexual activity: Not on file     [name] BatmanPeter W Evelyne Makepeace, MD   Note - This record has been created using [employer] software.  Chart creation errors have been sought, but may not always  have been located. Such creation errors do not reflect on  the standard of medical care.

## 2018-01-06 NOTE — Progress Notes (Signed)
Pt stated B/l hip are stiff, hurting with the lower back, worse when walking or bending about 3 months. B/L hip surgery 4 years ago with Dr. Lavella HammockWayner.

## 2018-01-13 ENCOUNTER — Ambulatory Visit (INDEPENDENT_AMBULATORY_CARE_PROVIDER_SITE_OTHER): Payer: Medicaid Other | Admitting: Family Medicine

## 2018-01-13 ENCOUNTER — Encounter: Payer: Self-pay | Admitting: Family Medicine

## 2018-01-13 VITALS — BP 115/74 | HR 86 | Resp 17 | Ht 69.0 in | Wt 215.8 lb

## 2018-01-13 DIAGNOSIS — R220 Localized swelling, mass and lump, head: Secondary | ICD-10-CM | POA: Diagnosis not present

## 2018-01-13 DIAGNOSIS — G44229 Chronic tension-type headache, not intractable: Secondary | ICD-10-CM | POA: Diagnosis not present

## 2018-01-13 DIAGNOSIS — E1169 Type 2 diabetes mellitus with other specified complication: Secondary | ICD-10-CM

## 2018-01-13 DIAGNOSIS — E782 Mixed hyperlipidemia: Secondary | ICD-10-CM | POA: Diagnosis not present

## 2018-01-13 LAB — GLUCOSE, POCT (MANUAL RESULT ENTRY): POC GLUCOSE: 111 mg/dL — AB (ref 70–99)

## 2018-01-13 MED ORDER — BLOOD GLUCOSE METER KIT: PACK | 0 refills | Status: AC

## 2018-01-13 MED ORDER — SUMATRIPTAN SUCCINATE 50 MG PO TABS: 100.0000 mg | ORAL_TABLET | Freq: Every day | ORAL | 1 refills | Status: DC | PRN

## 2018-01-13 NOTE — Progress Notes (Signed)
Established Patient Office Visit  Subjective:  Patient ID: [name] Pope, male    DOB: 06-Mar-1960  Age: 58 y.o. MRN: [id number]  CC:  Chief Complaint  Patient presents with  . Diabetes  . Hyperlipidemia    HPI [name] Pope presents for routine follow-up of chronic conditions, diabetes, hyperlipidemia and headaches.   Diabetes recent A1C 6.7 indicating good management. He doesn't check his blood sugar at home. Makes effort to manage diabetes with diet. He is unable to engage in routine physical activity given limitation of his chronic low back pain and bilateral hip pain.  He is currently been followed by [facility] for management of these chronic conditions and has been referred to pain management.  He is fasting today to obtain a lipid panel.  He also has a concern regarding ongoing headaches.  He was previously prescribed Fioricet but reports he was unable to pick up medication due to cost and no coverage under his current insurance plan.  He is requesting other medication for management of headaches.  He is also concerned regarding a nodule on the back of his head.  The nodule has been present for several years he dates back to adolescence.  He has no idea what happened to cause the nodule to appear on his head however he is concerned as it has increased in size.  He also is concerned as he has not suffered previously from chronic headaches and is wondering if the nodule has anything to do with the new onset of worsening daily headaches. He reports that the nodule was tender to touch at times however is nondraining and he characterizes the pain as a tension or tightness in the top of his head.  No prior imaging or evaluation of nodule. Denies new weakness, although reports occasional dizziness if headache is severe. No shortness of breath or chest pain. Past Medical History:  Diagnosis Date  . Allergy   . Arthritis   . Asthma   . BPH with urinary obstruction   . History of  appendicitis   . Lumbar radiculopathy   . Osteoporosis     Past Surgical History:  Procedure Laterality Date  . APPENDECTOMY    . 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 TOTAL HIP ARTHROPLASTY Left 05/12/2013   Procedure: LEFT TOTAL HIP ARTHROPLASTY ANTERIOR APPROACH;  Surgeon: Renette Butters, MD;  Location: [place];  Service: Orthopedics;  Laterality: Left;    Family History  Problem Relation Age of Onset  . Diabetes Mother   . Diabetes Father   . Hypertension Father     Social History   Socioeconomic History  . Marital status: Single    Spouse name: Not on file  . Number of children: 5  . Years of education: Not on file  . Highest education level: Not on file  Occupational History  . Occupation: disabled  Social Needs  . Financial resource strain: Not on file  . Food insecurity:    Worry: Not on file    Inability: Not on file  . Transportation needs:    Medical: Not on file    Non-medical: Not on file  Tobacco Use  . Smoking status: Never Smoker  . Smokeless tobacco: Never Used  Substance and Sexual Activity  . Alcohol use: Yes    Alcohol/week: 1.0 standard drinks  Types: 1 Cans of beer per week    Comment: daily  BEER  LIQUOR OCC  . Drug use: No  . Sexual activity: Not on file  Lifestyle  . Physical activity:    Days per week: Not on file    Minutes per session: Not on file  . Stress: Not on file  Relationships  . Social connections:    Talks on phone: Not on file    Gets together: Not on file    Attends religious service: Not on file    Active member of club or organization: Not on file    Attends meetings of clubs or organizations: Not on file    Relationship status: Not on file  . Intimate partner violence:    Fear of current or ex partner: Not on file    Emotionally abused: Not on file     Physically abused: Not on file    Forced sexual activity: Not on file  Other Topics Concern  . Not on file  Social History Narrative  . Not on file    Outpatient Medications Prior to Visit  Medication Sig Dispense Refill  . albuterol (PROVENTIL HFA;VENTOLIN HFA) 108 (90 Base) MCG/ACT inhaler Inhale 2 puffs into the lungs every 4 (four) hours as needed for wheezing or shortness of breath. 1 Inhaler 2  . atorvastatin (LIPITOR) 40 MG tablet Take 1 tablet (40 mg total) by mouth daily. 90 tablet 1  . butalbital-acetaminophen-caffeine (FIORICET, ESGIC) 50-325-40 MG tablet Take 1 tablet by mouth every 6 (six) hours as needed for headache or migraine (chronic headaches). 60 tablet 1  . fluticasone (FLOVENT DISKUS) 50 MCG/BLIST diskus inhaler Inhale 1 puff into the lungs 2 (two) times daily. 1 Inhaler 12  . gabapentin (NEURONTIN) 800 MG tablet Take 1 tablet (800 mg total) by mouth 3 (three) times daily. 90 tablet 1  . metFORMIN (GLUCOPHAGE) 500 MG tablet Take 1 tablet (500 mg total) by mouth 2 (two) times daily. 180 tablet 1  . montelukast (SINGULAIR) 10 MG tablet Take 1 tablet (10 mg total) by mouth at bedtime. 30 tablet 3  . Vitamin D, Ergocalciferol, (DRISDOL) 1.25 MG (50000 UT) CAPS capsule Take 1 capsule (50,000 Units total) by mouth every 7 (seven) days. 12 capsule 1   No facility-administered medications prior to visit.     Allergies  Allergen Reactions  . Penicillins     Childhood Has patient had a PCN reaction causing immediate rash, facial/tongue/throat swelling, SOB or lightheadedness with hypotension: n/a Has patient had a PCN reaction causing severe rash involving mucus membranes or skin necrosis: n/a Has patient had a PCN reaction that required hospitalization: n/a Has patient had a PCN reaction occurring within the last 10 years: n/a If all of the above answers are "NO", then may proceed with Cephalosporin use.     ROS Review of Systems Pertinent negatives listed in HPI     Objective:    Physical Exam  BP 115/74   Pulse 86   Resp 17   Ht 5' 9" (1.753 m)   Wt 215 lb 12.8 oz (97.9 kg)   SpO2 96%   BMI 31.87 kg/m  Wt Readings from Last 3 Encounters:  01/13/18 215 lb 12.8 oz (97.9 kg)  01/06/18 217 lb (98.4 kg)  12/16/17 218 lb 9.6 oz (99.2 kg)   Physical Exam: Constitutional: Patient appears well-developed and well-nourished. No distress. HENT: Normocephalic, atraumatic, External right and left ear normal. Oropharynx is clear and moist.  Eyes: Conjunctivae   and EOM are normal. PERRLA, no scleral icterus. Neck: Normal ROM. Neck supple. No JVD. No tracheal deviation. No thyromegaly. CVS: RRR, S1/S2 +, no murmurs, no gallops, no carotid bruit.  Pulmonary: Effort and breath sounds normal, no stridor, rhonchi, wheezes, rales.  Abdominal: Soft. BS +, no distension, tenderness, rebound or guarding.  Musculoskeletal: Normal range of motion. No edema and no tenderness.  Lymphadenopathy: No lymphadenopathy noted, cervical, inguinal or axillary Neuro: Alert. Normal reflexes, muscle tone coordination. No cranial nerve deficit. Skin: Skin is warm and dry. No rash noted. Not diaphoretic. No erythema. No pallor. Psychiatric: Normal mood and affect. Behavior, judgment, thought content normal.  Health Maintenance Due  Topic Date Due  . Hepatitis C Screening  11/15/1960  . URINE MICROALBUMIN  12/27/1969  . HIV Screening  12/28/1974  . COLONOSCOPY  12/27/2009    There are no preventive care reminders to display for this patient.  Lab Results  Component Value Date   TSH 1.310 12/16/2017   Lab Results  Component Value Date   WBC 3.9 12/16/2017   HGB 12.8 (L) 12/16/2017   HCT 38.9 12/16/2017   MCV 89 12/16/2017   PLT 307 12/16/2017   Lab Results  Component Value Date   NA 137 12/16/2017   K 3.9 12/16/2017   CO2 19 (L) 12/16/2017   GLUCOSE 99 12/16/2017   BUN 11 12/16/2017   CREATININE 0.79 12/16/2017   BILITOT 0.2 12/16/2017   ALKPHOS 61 12/16/2017    AST 16 12/16/2017   ALT 16 12/16/2017   PROT 7.4 12/16/2017   ALBUMIN 4.2 12/16/2017   CALCIUM 9.0 12/16/2017   ANIONGAP 6 05/19/2016   Lab Results  Component Value Date   CHOL 186 12/16/2017   Lab Results  Component Value Date   HDL 33 (L) 12/16/2017   Lab Results  Component Value Date   LDLCALC 130 (H) 12/16/2017   Lab Results  Component Value Date   TRIG 116 12/16/2017   Lab Results  Component Value Date   CHOLHDL 5.6 (H) 12/16/2017   Lab Results  Component Value Date   HGBA1C 6.7 (H) 12/16/2017      Assessment & Plan:  1. Type 2 diabetes mellitus with other specified complication, without long-term current use of insulin (HCC) - Glucose (CBG), stable - No changes to medication regimen Aim for 30 minutes of exercise most days, with a goal of 150 minutes per week. -Glucose monitoring at minimal of twice daily and keep a log of readings. -increase foods containing whole grains (one-half of grain intake). -saturated fat intake should be reduced -reduce intake of trans fat (lowers LDL cholesterol and increases HDL cholesterol) -Eat 4-5 small meals during the day to reduce the risk of becoming hungry.  2. Mixed hyperlipidemia -Fasting lipid panel today   3. Subcutaneous nodule of head Will order an  US Head initially and then follow-up with CT of head if warraned.  4. Generalized headaches, chronic tension  -Discontinue Fioricet -Trial Sumatriptan twice daily not to exceed 200 mg within 24 hours.    Meds ordered this encounter  Medications  . blood glucose meter kit and supplies    Sig: Dispense based on patient and insurance preference. Use up to four times daily as directed. (FOR ICD-10 E10.9, E11.9).    Dispense:  1 each    Refill:  0    Order Specific Question:   Number of strips    Answer:   200    Order Specific Question:     Number of lancets    Answer:   200  . SUMAtriptan (IMITREX) 50 MG tablet    Sig: Take 2 tablets (100 mg total) by mouth  daily as needed for migraine or headache (do not exceed 200 mg within 24 hours). May repeat in 2 hours if headache persists or recurs.    Dispense:  20 tablet    Refill:  1   Orders Placed This Encounter  Procedures  . [place] Head  . Glucose (CBG)     Follow-up: No follow-ups on file.    Molli Barrows, FNP

## 2018-01-13 NOTE — Patient Instructions (Signed)
We will contact you once your imaging is scheduled to evaluate nodule on your head.   General Headache Without Cause A headache is pain or discomfort that is felt around the head or neck area. There are many causes and types of headaches. In some cases, the cause may not be found. Follow these instructions at home: Watch your condition for any changes. Let your doctor know about them. Take these steps to help with your condition: Managing pain      Take over-the-counter and prescription medicines only as told by your doctor.  Lie down in a dark, quiet room when you have a headache.  If told, put ice on your head and neck area: ? Put ice in a plastic bag. ? Place a towel between your skin and the bag. ? Leave the ice on for 20 minutes, 2-3 times per day.  If told, put heat on the affected area. Use the heat source that your doctor recommends, such as a moist heat pack or a heating pad. ? Place a towel between your skin and the heat source. ? Leave the heat on for 20-30 minutes. ? Remove the heat if your skin turns bright red. This is very important if you are unable to feel pain, heat, or cold. You may have a greater risk of getting burned.  Keep lights dim if bright lights bother you or make your headaches worse. Eating and drinking  Eat meals on a regular schedule.  If you drink alcohol: ? Limit how much you use to:  0-1 drink a day for women.  0-2 drinks a day for men. ? Be aware of how much alcohol is in your drink. In the [place] one drink equals one 12 oz bottle of beer (355 mL), one 5 oz glass of wine (148 mL), or one 1 oz glass of hard liquor (44 mL).  Stop drinking caffeine, or reduce how much caffeine you drink. General instructions   Keep a journal to find out if certain things bring on headaches. For example, write down: ? What you eat and drink. ? How much sleep you get. ? Any change to your diet or medicines.  Get a massage or try other ways to  relax.  Limit stress.  Sit up straight. Do not tighten (tense) your muscles.  Do not use any products that contain nicotine or tobacco. This includes cigarettes, e-cigarettes, and chewing tobacco. If you need help quitting, ask your doctor.  Exercise regularly as told by your doctor.  Get enough sleep. This often means 7-9 hours of sleep each night.  Keep all follow-up visits as told by your doctor. This is important. Contact a doctor if:  Your symptoms are not helped by medicine.  You have a headache that feels different than the other headaches.  You feel sick to your stomach (nauseous) or you throw up (vomit).  You have a fever. Get help right away if:  Your headache gets very bad quickly.  Your headache gets worse after a lot of physical activity.  You keep throwing up.  You have a stiff neck.  You have trouble seeing.  You have trouble speaking.  You have pain in the eye or ear.  Your muscles are weak or you lose muscle control.  You lose your balance or have trouble walking.  You feel like you will pass out (faint) or you pass out.  You are mixed up (confused).  You have a seizure. Summary  A headache is pain  or discomfort that is felt around the head or neck area.  There are many causes and types of headaches. In some cases, the cause may not be found.  Keep a journal to help find out what causes your headaches. Watch your condition for any changes. Let your doctor know about them.  Contact a doctor if you have a headache that is different from usual, or if your headache is not helped by medicine.  Get help right away if your headache gets very bad, you throw up, you have trouble seeing, you lose your balance, or you have a seizure. This information is not intended to replace advice given to you by your health care provider. Make sure you discuss any questions you have with your health care provider. Document Released: 10/04/2007 Document Revised:  07/15/2017 Document Reviewed: 07/15/2017 Elsevier Interactive Patient Education  2019 [employer]

## 2018-01-15 ENCOUNTER — Telehealth: Payer: Self-pay | Admitting: Family Medicine

## 2018-01-15 NOTE — Telephone Encounter (Signed)
Left voice mail to call back 

## 2018-01-15 NOTE — Telephone Encounter (Signed)
Bing Neighbors, FNP 01/15/18 2:13 PM  Note    Schedule [place] of head. Notify patient of date and time. Study order placed.

## 2018-01-15 NOTE — Telephone Encounter (Signed)
See prior notes

## 2018-01-15 NOTE — Telephone Encounter (Signed)
Called [employer]. Ultrasound approval number [id number].

## 2018-01-15 NOTE — Telephone Encounter (Signed)
Schedule [place] of head. Notify patient of date and time. Study order placed.

## 2018-01-15 NOTE — Telephone Encounter (Signed)
Ultrasound appointment is 01/21/18 @ 11:15 AM

## 2018-01-16 NOTE — Telephone Encounter (Signed)
Patient notified of appointment information.

## 2018-01-20 ENCOUNTER — Telehealth: Payer: Self-pay | Admitting: Family Medicine

## 2018-01-20 NOTE — Addendum Note (Signed)
Addended by: Bing Neighbors on: 01/20/2018 10:06 AM   Modules accepted: Orders

## 2018-01-20 NOTE — Telephone Encounter (Signed)
Order changed in system to reflect US head and neck soft tissue Contact Peggy in ultrasound to advise [phone]

## 2018-01-21 ENCOUNTER — Ambulatory Visit (HOSPITAL_COMMUNITY): Payer: Medicaid Other

## 2018-01-24 ENCOUNTER — Telehealth: Payer: Self-pay | Admitting: *Deleted

## 2018-01-27 ENCOUNTER — Ambulatory Visit (HOSPITAL_COMMUNITY): Payer: Medicaid Other

## 2018-02-06 ENCOUNTER — Ambulatory Visit (INDEPENDENT_AMBULATORY_CARE_PROVIDER_SITE_OTHER): Payer: Medicaid Other | Admitting: Orthopaedic Surgery

## 2018-02-10 ENCOUNTER — Encounter (INDEPENDENT_AMBULATORY_CARE_PROVIDER_SITE_OTHER): Payer: Medicaid Other | Admitting: Ophthalmology

## 2018-02-14 ENCOUNTER — Ambulatory Visit (HOSPITAL_COMMUNITY)
Admission: EM | Admit: 2018-02-14 | Discharge: 2018-02-14 | Disposition: A | Discharge status: Home / Self Care | Payer: Medicaid Other | Attending: Family Medicine | Admitting: Family Medicine

## 2018-02-14 ENCOUNTER — Other Ambulatory Visit: Payer: Self-pay

## 2018-02-14 ENCOUNTER — Encounter (HOSPITAL_COMMUNITY): Payer: Self-pay | Admitting: Emergency Medicine

## 2018-02-14 DIAGNOSIS — J111 Influenza due to unidentified influenza virus with other respiratory manifestations: Secondary | ICD-10-CM

## 2018-02-14 DIAGNOSIS — R059 Cough, unspecified: Secondary | ICD-10-CM

## 2018-02-14 DIAGNOSIS — R69 Illness, unspecified: Secondary | ICD-10-CM

## 2018-02-14 DIAGNOSIS — R05 Cough: Secondary | ICD-10-CM

## 2018-02-14 DIAGNOSIS — M25551 Pain in right hip: Secondary | ICD-10-CM

## 2018-02-14 MED ORDER — ACETAMINOPHEN 325 MG PO TABS
ORAL_TABLET | ORAL | Status: AC
  Filled 2018-02-14: qty 2

## 2018-02-14 MED ORDER — HYDROCODONE-HOMATROPINE 5-1.5 MG/5ML PO SYRP: 5.0000 mL | ORAL_SOLUTION | Freq: Four times a day (QID) | ORAL | 0 refills | Status: DC | PRN

## 2018-02-14 MED ORDER — ACETAMINOPHEN 325 MG PO TABS
650.0000 mg | ORAL_TABLET | Freq: Once | ORAL | Status: AC
  Administered 2018-02-14: 650 mg via ORAL

## 2018-02-14 NOTE — ED Triage Notes (Signed)
The patient presented to the [facility] with a complaint of chronic right side hip pain that has gotten worse in the last few days. The patient also complained of flu like symptoms.

## 2018-02-14 NOTE — Discharge Instructions (Signed)

## 2018-02-18 NOTE — ED Provider Notes (Signed)
[facility]   [id number] 02/14/18 Arrival Time: 9702  ASSESSMENT & PLAN:  1. Right hip pain   2. Influenza-like illness   3. Cough    See AVS for discharge instructions.  Meds ordered this encounter  Medications  . acetaminophen (TYLENOL) tablet 650 mg  . HYDROcodone-homatropine (HYCODAN) 5-1.5 MG/5ML syrup    Sig: Take 5 mLs by mouth every 6 (six) hours as needed for cough.    Dispense:  90 mL    Refill:  0   Cough medication sedation precautions. Discussed typical duration of symptoms. OTC symptom care as needed. Ensure adequate fluid intake and rest. May f/u with PCP or here as needed.  Reviewed expectations re: course of current medical issues. Questions answered. Outlined signs and symptoms indicating need for more acute intervention. Patient verbalized understanding. After Visit Summary given.   SUBJECTIVE: History from: patient.  [name] Pope is a 58 y.o. male who presents with complaint of nasal congestion, post-nasal drainage, and a persistent dry cough; without sore throat. Onset abrupt, about 3 days ago; with fatigue and with body aches. SOB: none. Wheezing: none. Fever: questions subjective. Overall normal PO intake without n/v. Known sick contacts: no. No specific or significant aggravating or alleviating factors reported. Cough affecting sleep. OTC treatment: none reported.  Also reports chronic R hip pain. Has had hip replaced. Hasn't seen his orthopaedist in quite awhile. Trying to get in to see a pain management doctor. No specific acute on chronic exacerbation reported. No new trauma. Ambulatory with reported pain of R hip.   Social History   Tobacco Use  Smoking Status Never Smoker  Smokeless Tobacco Never Used    ROS: As per HPI. All other systems negative.   OBJECTIVE:  Vitals:   02/14/18 1839  BP: 118/74  Pulse: 96  Resp: 18  Temp: (!) 101.3 F (38.5 C)  TempSrc: Temporal  SpO2: 98%     General appearance: alert;  appears fatigued HEENT: nasal congestion; clear runny nose; throat irritation secondary to post-nasal drainage Neck: supple without LAD CV: RRR Lungs: unlabored respirations, symmetrical air entry without wheezing; cough: moderate Abd: soft Ext: no LE edema; does report vague R hip pain to palpation; FROM of R hip; no overlying skin changes; no specific trochanteric bursa tenderness Skin: warm and dry Psychological: alert and cooperative; normal mood and affect    Allergies  Allergen Reactions  . Penicillins     Childhood Has patient had a PCN reaction causing immediate rash, facial/tongue/throat swelling, SOB or lightheadedness with hypotension: n/a Has patient had a PCN reaction causing severe rash involving mucus membranes or skin necrosis: n/a Has patient had a PCN reaction that required hospitalization: n/a Has patient had a PCN reaction occurring within the last 10 years: n/a If all of the above answers are "NO", then may proceed with Cephalosporin use.     Past Medical History:  Diagnosis Date  . Allergy   . Arthritis   . Asthma   . BPH with urinary obstruction   . History of appendicitis   . Lumbar radiculopathy   . Osteoporosis    Family History  Problem Relation Age of Onset  . Diabetes Mother   . Diabetes Father   . Hypertension Father    Social History   Socioeconomic History  . Marital status: Single    Spouse name: Not on file  . Number of children: 5  . Years of education: Not on file  . Highest education level: Not on  file  Occupational History  . Occupation: disabled  Social Needs  . Financial resource strain: Not on file  . Food insecurity:    Worry: Not on file    Inability: Not on file  . Transportation needs:    Medical: Not on file    Non-medical: Not on file  Tobacco Use  . Smoking status: Never Smoker  . Smokeless tobacco: Never Used  Substance and Sexual Activity  . Alcohol use: Yes    Alcohol/week: 1.0 standard drinks    Types:  1 Cans of beer per week    Comment: daily  BEER  LIQUOR OCC  . Drug use: No  . Sexual activity: Not on file  Lifestyle  . Physical activity:    Days per week: Not on file    Minutes per session: Not on file  . Stress: Not on file  Relationships  . Social connections:    Talks on phone: Not on file    Gets together: Not on file    Attends religious service: Not on file    Active member of club or organization: Not on file    Attends meetings of clubs or organizations: Not on file    Relationship status: Not on file  . Intimate partner violence:    Fear of current or ex partner: Not on file    Emotionally abused: Not on file    Physically abused: Not on file    Forced sexual activity: Not on file  Other Topics Concern  . Not on file  Social History Narrative  . Not on file           Mardella Layman, MD 02/19/18 1043

## 2018-04-17 ENCOUNTER — Ambulatory Visit (HOSPITAL_COMMUNITY)
Admission: EM | Admit: 2018-04-17 | Discharge: 2018-04-17 | Disposition: A | Discharge status: Home / Self Care | Payer: Medicaid Other | Attending: Family Medicine | Admitting: Family Medicine

## 2018-04-17 ENCOUNTER — Encounter (HOSPITAL_COMMUNITY): Payer: Self-pay | Admitting: Emergency Medicine

## 2018-04-17 ENCOUNTER — Other Ambulatory Visit: Payer: Self-pay

## 2018-04-17 DIAGNOSIS — E119 Type 2 diabetes mellitus without complications: Secondary | ICD-10-CM

## 2018-04-17 DIAGNOSIS — K429 Umbilical hernia without obstruction or gangrene: Secondary | ICD-10-CM

## 2018-04-17 DIAGNOSIS — J45909 Unspecified asthma, uncomplicated: Secondary | ICD-10-CM

## 2018-04-17 DIAGNOSIS — E785 Hyperlipidemia, unspecified: Secondary | ICD-10-CM

## 2018-04-17 NOTE — ED Provider Notes (Signed)
[facility]    CSN: [id number] Arrival date & time: 04/17/18  1545     History   Chief Complaint Chief Complaint  Patient presents with  . Abdominal Pain    HPI [name] Pope is a 58 y.o. male.   HPI  Noted pain in the umbilicus last evening after digging in the dirt with a hand trowel for fishing worms.  Past Medical History:  Diagnosis Date  . Allergy   . Arthritis   . Asthma   . BPH with urinary obstruction   . History of appendicitis   . Lumbar radiculopathy   . Osteoporosis     Patient Active Problem List   Diagnosis Date Noted  . HLD (hyperlipidemia) 04/17/2018  . Type 2 diabetes mellitus ([place]) 04/17/2018  . Asthma, chronic 04/17/2018  . Alcohol intoxication, uncomplicated ([place]) 85/63/1497  . Benign prostatic hyperplasia with urinary obstruction 05/12/2015  . DJD (degenerative joint disease) 05/12/2013  . Hip pain, chronic 11/19/2011    Past Surgical History:  Procedure Laterality Date  . APPENDECTOMY    . TOTAL HIP ARTHROPLASTY Right 10/22/2012   Procedure: RIGHT TOTAL HIP ARTHROPLASTY ANTERIOR APPROACH;  Surgeon: Renette Butters, MD;  Location: [facility];  Service: Orthopedics;  Laterality: Right;  . TOTAL HIP ARTHROPLASTY Left 05/12/2013   Procedure: LEFT TOTAL HIP ARTHROPLASTY ANTERIOR APPROACH;  Surgeon: Renette Butters, MD;  Location: [facility];  Service: Orthopedics;  Laterality: Left;       Home Medications    Prior to Admission medications   Medication Sig Start Date End Date Taking? Authorizing Provider  albuterol (PROVENTIL HFA;VENTOLIN HFA) 108 (90 Base) MCG/ACT inhaler Inhale 2 puffs into the lungs every 4 (four) hours as needed for wheezing or shortness of breath. 12/16/17  Yes Scot Jun, FNP  atorvastatin (LIPITOR) 40 MG tablet Take 1 tablet (40 mg total) by mouth daily. 12/16/17  Yes Scot Jun, FNP  fluticasone (FLOVENT DISKUS) 50 MCG/BLIST diskus inhaler Inhale 1 puff into the lungs 2 (two) times daily. 12/16/17   Yes Scot Jun, FNP  gabapentin (NEURONTIN) 800 MG tablet Take 1 tablet (800 mg total) by mouth 3 (three) times daily. 12/16/17  Yes Scot Jun, FNP  metFORMIN (GLUCOPHAGE) 500 MG tablet Take 1 tablet (500 mg total) by mouth 2 (two) times daily. 12/16/17  Yes Scot Jun, FNP  montelukast (SINGULAIR) 10 MG tablet Take 1 tablet (10 mg total) by mouth at bedtime. 12/16/17  Yes Scot Jun, FNP  SUMAtriptan (IMITREX) 50 MG tablet Take 2 tablets (100 mg total) by mouth daily as needed for migraine or headache (do not exceed 200 mg within 24 hours). May repeat in 2 hours if headache persists or recurs. 01/13/18  Yes Scot Jun, FNP  Vitamin D, Ergocalciferol, (DRISDOL) 1.25 MG (50000 UT) CAPS capsule Take 1 capsule (50,000 Units total) by mouth every 7 (seven) days. 12/24/17  Yes Scot Jun, FNP  blood glucose meter kit and supplies Dispense based on patient and insurance preference. Use up to four times daily as directed. (FOR ICD-10 E10.9, E11.9). 01/13/18   Scot Jun, FNP    Family History Family History  Problem Relation Age of Onset  . Diabetes Mother   . Diabetes Father   . Hypertension Father     Social History Social History   Tobacco Use  . Smoking status: Never Smoker  . Smokeless tobacco: Never Used  Substance Use Topics  . Alcohol use: Yes  Alcohol/week: 1.0 standard drinks    Types: 1 Cans of beer per week    Comment: daily  BEER  LIQUOR OCC  . Drug use: No     Allergies   Penicillins   Review of Systems Review of Systems  Constitutional: Negative for chills and fever.  HENT: Negative for ear pain and sore throat.   Eyes: Negative for pain and visual disturbance.  Respiratory: Negative for cough and shortness of breath.   Cardiovascular: Negative for chest pain and palpitations.  Gastrointestinal: Positive for abdominal pain and constipation. Negative for vomiting.  Genitourinary: Negative for dysuria and hematuria.   Musculoskeletal: Negative for arthralgias and back pain.  Skin: Negative for color change and rash.  Neurological: Negative for seizures and syncope.  All other systems reviewed and are negative.    Physical Exam Triage Vital Signs ED Triage Vitals [04/17/18 1647]  Enc Vitals Group     BP 122/86     Pulse Rate 100     Resp 16     Temp 98.3 F (36.8 C)     Temp src      SpO2 97 %     Weight      Height      Head Circumference      Peak Flow      Pain Score 3     Pain Loc      Pain Edu?      Excl. in [place]?    No data found.  Updated Vital Signs BP 122/86   Pulse 100   Temp 98.3 F (36.8 C)   Resp 16   SpO2 97%       Physical Exam Constitutional:      General: He is not in acute distress.    Appearance: He is well-developed. He is obese. He is not ill-appearing.  HENT:     Head: Normocephalic and atraumatic.  Eyes:     Conjunctiva/sclera: Conjunctivae normal.     Pupils: Pupils are equal, round, and reactive to light.  Neck:     Musculoskeletal: Normal range of motion.  Cardiovascular:     Rate and Rhythm: Normal rate and regular rhythm.     Heart sounds: Normal heart sounds.  Pulmonary:     Effort: Pulmonary effort is normal. No respiratory distress.     Breath sounds: Normal breath sounds.  Abdominal:     General: A surgical scar is present. Bowel sounds are normal. There is no distension.     Palpations: Abdomen is soft. There is no hepatomegaly or splenomegaly.     Tenderness: There is abdominal tenderness.     Hernia: A hernia is present. Hernia is present in the umbilical area.    Musculoskeletal: Normal range of motion.  Skin:    General: Skin is warm and dry.  Neurological:     Mental Status: He is alert.      UC Treatments / Results  Labs (all labs ordered are listed, but only abnormal results are displayed) Labs Reviewed - No data to display  EKG None  Radiology No results found.  Procedures Procedures (including critical care  time)  Medications Ordered in UC Medications - No data to display  Initial Impression / Assessment and Plan / UC Course  I have reviewed the triage vital signs and the nursing notes.  Pertinent labs & imaging results that were available during my care of the patient were reviewed by me and considered in my medical decision making (see  chart for details).     Discussed that this is small.  Minor.  Will not likely need repair Final Clinical Impressions(s) / UC Diagnoses   Final diagnoses:  Umbilical hernia without obstruction and without gangrene     Discharge Instructions     You may take ibuprofen 3 times a day with food, if needed for pain Eat a high-fiber diet.  Drink plenty of water and fluids.  Is important not to become constipated Follow-up with your primary care doctor   ED Prescriptions    None     Controlled Substance Prescriptions [place] Controlled Substance Registry consulted? Not Applicable   Raylene Everts, MD 04/17/18 1743

## 2018-04-17 NOTE — Discharge Instructions (Signed)
You may take ibuprofen 3 times a day with food, if needed for pain Eat a high-fiber diet.  Drink plenty of water and fluids.  Is important not to become constipated Follow-up with your primary care doctor

## 2018-04-17 NOTE — ED Triage Notes (Signed)
PT reports umbilical / naval pain that started yesterday. Pain when palpating naval area. BM yesterday, normal.

## 2018-06-07 ENCOUNTER — Encounter (HOSPITAL_COMMUNITY): Payer: Self-pay

## 2018-06-07 ENCOUNTER — Other Ambulatory Visit: Payer: Self-pay

## 2018-06-07 ENCOUNTER — Ambulatory Visit (HOSPITAL_COMMUNITY)
Admission: EM | Admit: 2018-06-07 | Discharge: 2018-06-07 | Disposition: A | Discharge status: Home / Self Care | Payer: Medicaid Other | Attending: Family Medicine | Admitting: Family Medicine

## 2018-06-07 DIAGNOSIS — M545 Low back pain, unspecified: Secondary | ICD-10-CM

## 2018-06-07 DIAGNOSIS — M25551 Pain in right hip: Secondary | ICD-10-CM

## 2018-06-07 MED ORDER — METHOCARBAMOL 500 MG PO TABS: 500.0000 mg | ORAL_TABLET | Freq: Two times a day (BID) | ORAL | 0 refills | Status: DC

## 2018-06-07 MED ORDER — MELOXICAM 7.5 MG PO TABS: 7.5000 mg | ORAL_TABLET | Freq: Every day | ORAL | 0 refills | Status: DC
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
## 2018-06-12 ENCOUNTER — Encounter (HOSPITAL_COMMUNITY): Payer: Self-pay

## 2018-06-12 ENCOUNTER — Ambulatory Visit (INDEPENDENT_AMBULATORY_CARE_PROVIDER_SITE_OTHER): Payer: Medicaid Other

## 2018-06-12 ENCOUNTER — Ambulatory Visit (HOSPITAL_COMMUNITY)
Admission: EM | Admit: 2018-06-12 | Discharge: 2018-06-12 | Disposition: A | Discharge status: Home / Self Care | Payer: Medicaid Other | Attending: Urgent Care | Admitting: Urgent Care

## 2018-06-12 DIAGNOSIS — R030 Elevated blood-pressure reading, without diagnosis of hypertension: Secondary | ICD-10-CM

## 2018-06-12 DIAGNOSIS — M25562 Pain in left knee: Secondary | ICD-10-CM

## 2018-06-12 DIAGNOSIS — M1990 Unspecified osteoarthritis, unspecified site: Secondary | ICD-10-CM | POA: Diagnosis not present

## 2018-06-12 DIAGNOSIS — M25462 Effusion, left knee: Secondary | ICD-10-CM

## 2018-06-12 MED ORDER — MELOXICAM 7.5 MG PO TABS: 7.5000 mg | ORAL_TABLET | Freq: Every day | ORAL | 0 refills | Status: DC
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
## 2018-07-10 ENCOUNTER — Telehealth: Payer: Self-pay

## 2018-07-10 NOTE — Telephone Encounter (Signed)
Called patient to do their pre-visit COVID screening.  Call went to voicemail. Unable to do prescreening.  

## 2018-07-14 ENCOUNTER — Ambulatory Visit: Payer: Medicaid Other | Admitting: Family Medicine

## 2018-10-27 ENCOUNTER — Other Ambulatory Visit: Payer: Self-pay

## 2018-10-27 ENCOUNTER — Encounter (HOSPITAL_COMMUNITY): Payer: Self-pay

## 2018-10-27 ENCOUNTER — Ambulatory Visit (HOSPITAL_COMMUNITY)
Admission: EM | Admit: 2018-10-27 | Discharge: 2018-10-27 | Disposition: A | Discharge status: Home / Self Care | Payer: Medicaid Other | Attending: Internal Medicine | Admitting: Internal Medicine

## 2018-10-27 DIAGNOSIS — Z79899 Other long term (current) drug therapy: Secondary | ICD-10-CM | POA: Diagnosis not present

## 2018-10-27 DIAGNOSIS — E785 Hyperlipidemia, unspecified: Secondary | ICD-10-CM | POA: Diagnosis not present

## 2018-10-27 DIAGNOSIS — E119 Type 2 diabetes mellitus without complications: Secondary | ICD-10-CM | POA: Insufficient documentation

## 2018-10-27 DIAGNOSIS — Z791 Long term (current) use of non-steroidal anti-inflammatories (NSAID): Secondary | ICD-10-CM | POA: Insufficient documentation

## 2018-10-27 DIAGNOSIS — Z20828 Contact with and (suspected) exposure to other viral communicable diseases: Secondary | ICD-10-CM | POA: Diagnosis not present

## 2018-10-27 DIAGNOSIS — R05 Cough: Secondary | ICD-10-CM | POA: Insufficient documentation

## 2018-10-27 DIAGNOSIS — R067 Sneezing: Secondary | ICD-10-CM | POA: Diagnosis not present

## 2018-10-27 DIAGNOSIS — Z7984 Long term (current) use of oral hypoglycemic drugs: Secondary | ICD-10-CM | POA: Insufficient documentation

## 2018-10-27 DIAGNOSIS — M1990 Unspecified osteoarthritis, unspecified site: Secondary | ICD-10-CM | POA: Insufficient documentation

## 2018-10-27 DIAGNOSIS — J111 Influenza due to unidentified influenza virus with other respiratory manifestations: Secondary | ICD-10-CM | POA: Diagnosis not present

## 2018-10-27 DIAGNOSIS — J45909 Unspecified asthma, uncomplicated: Secondary | ICD-10-CM | POA: Diagnosis not present

## 2018-10-27 MED ORDER — BENZONATATE 100 MG PO CAPS: 100.0000 mg | ORAL_CAPSULE | Freq: Three times a day (TID) | ORAL | 0 refills | Status: DC

## 2018-10-27 NOTE — ED Provider Notes (Signed)
[place]    CSN: [id number] Arrival date & time: 10/27/18  1459      History   Chief Complaint Chief Complaint  Patient presents with  . Cough    HPI [name] Pope is a 58 y.o. male with a history of asthma comes to urgent care with complaints of  productive cough of 3 days duration.  Patient says he had some chills earlier and also had an episode of headache which are currently resolved.  Symptoms started with sneezing and some nasal congestion.  Patient denies any fever.  No chest pain or chest pressure.  Symptoms have been persistent over the past 3 days.  No sick contacts.  Sputum is clear.  Appetite is preserved. HPI  Past Medical History:  Diagnosis Date  . Allergy   . Arthritis   . Asthma   . BPH with urinary obstruction   . History of appendicitis   . Lumbar radiculopathy   . Osteoporosis     Patient Active Problem List   Diagnosis Date Noted  . HLD (hyperlipidemia) 04/17/2018  . Type 2 diabetes mellitus ([place]) 04/17/2018  . Asthma, chronic 04/17/2018  . Alcohol intoxication, uncomplicated ([place]) 69/62/9528  . Benign prostatic hyperplasia with urinary obstruction 05/12/2015  . DJD (degenerative joint disease) 05/12/2013  . Hip pain, chronic 11/19/2011    Past Surgical History:  Procedure Laterality Date  . APPENDECTOMY    . TOTAL HIP ARTHROPLASTY Right 10/22/2012   Procedure: RIGHT TOTAL HIP ARTHROPLASTY ANTERIOR APPROACH;  Surgeon: Renette Butters, MD;  Location: [place] [facility];  Service: Orthopedics;  Laterality: Right;  . TOTAL HIP ARTHROPLASTY Left 05/12/2013   Procedure: LEFT TOTAL HIP ARTHROPLASTY ANTERIOR APPROACH;  Surgeon: Renette Butters, MD;  Location: [place];  Service: Orthopedics;  Laterality: Left;       Home Medications    Prior to Admission medications   Medication Sig Start Date End Date Taking? Authorizing Provider  albuterol (PROVENTIL HFA;VENTOLIN HFA) 108 (90 Base) MCG/ACT inhaler Inhale 2 puffs into the lungs every 4 (four)  hours as needed for wheezing or shortness of breath. 12/16/17   Scot Jun, FNP  atorvastatin (LIPITOR) 40 MG tablet Take 1 tablet (40 mg total) by mouth daily. 12/16/17   Scot Jun, FNP  blood glucose meter kit and supplies Dispense based on patient and insurance preference. Use up to four times daily as directed. (FOR ICD-10 E10.9, E11.9). 01/13/18   Scot Jun, FNP  fluticasone (FLOVENT DISKUS) 50 MCG/BLIST diskus inhaler Inhale 1 puff into the lungs 2 (two) times daily. 12/16/17   Scot Jun, FNP  gabapentin (NEURONTIN) 800 MG tablet Take 1 tablet (800 mg total) by mouth 3 (three) times daily. 12/16/17   Scot Jun, FNP  meloxicam (MOBIC) 7.5 MG tablet Take 1 tablet (7.5 mg total) by mouth daily. 06/12/18   Jaynee Eagles, PA-C  metFORMIN (GLUCOPHAGE) 500 MG tablet Take 1 tablet (500 mg total) by mouth 2 (two) times daily. 12/16/17   Scot Jun, FNP  montelukast (SINGULAIR) 10 MG tablet Take 1 tablet (10 mg total) by mouth at bedtime. 12/16/17   Scot Jun, FNP  oxyCODONE-acetaminophen (PERCOCET) 10-325 MG tablet Take 1 tablet by mouth every 4 (four) hours as needed for pain.    [provider]  SUMAtriptan (IMITREX) 50 MG tablet Take 2 tablets (100 mg total) by mouth daily as needed for migraine or headache (do not exceed 200 mg within 24 hours). May repeat in  2 hours if headache persists or recurs. 01/13/18   Scot Jun, FNP    Family History Family History  Problem Relation Age of Onset  . Diabetes Mother   . Diabetes Father   . Hypertension Father     Social History Social History   Tobacco Use  . Smoking status: Never Smoker  . Smokeless tobacco: Never Used  Substance Use Topics  . Alcohol use: Yes    Alcohol/week: 1.0 standard drinks    Types: 1 Cans of beer per week    Comment: daily  BEER  LIQUOR OCC  . Drug use: No     Allergies   Penicillins   Review of Systems Review of Systems  Constitutional:  Negative.   HENT: Positive for congestion and sneezing. Negative for nosebleeds, postnasal drip, sinus pressure, sinus pain, sore throat and voice change.   Respiratory: Positive for cough. Negative for chest tightness, shortness of breath and wheezing.   Gastrointestinal: Negative for abdominal pain, diarrhea, nausea and vomiting.  Genitourinary: Negative.   Musculoskeletal: Negative.   Neurological: Positive for headaches. Negative for dizziness and weakness.     Physical Exam Triage Vital Signs ED Triage Vitals  Enc Vitals Group     BP 10/27/18 1612 (!) 135/92     Pulse Rate 10/27/18 1612 78     Resp 10/27/18 1612 16     Temp 10/27/18 1612 98.2 F (36.8 C)     Temp Source 10/27/18 1612 Oral     SpO2 10/27/18 1612 99 %     Weight --      Height --      Head Circumference --      Peak Flow --      Pain Score 10/27/18 1609 0     Pain Loc --      Pain Edu? --      Excl. in [place]? --    No data found.  Updated Vital Signs BP (!) 135/92 (BP Location: Left Arm)   Pulse 78   Temp 98.2 F (36.8 C) (Oral)   Resp 16   SpO2 99%   Visual Acuity Right Eye Distance:   Left Eye Distance:   Bilateral Distance:    Right Eye Near:   Left Eye Near:    Bilateral Near:     Physical Exam Vitals signs and nursing note reviewed.  Constitutional:      General: He is not in acute distress.    Appearance: He is not ill-appearing or toxic-appearing.  Cardiovascular:     Rate and Rhythm: Normal rate and regular rhythm.     Heart sounds: No murmur.  Pulmonary:     Effort: Pulmonary effort is normal. No respiratory distress.     Breath sounds: Normal breath sounds. No rhonchi or rales.  Abdominal:     General: Bowel sounds are normal.     Palpations: Abdomen is soft.  Musculoskeletal: Normal range of motion.        General: No swelling, tenderness or signs of injury.  Skin:    Capillary Refill: Capillary refill takes less than 2 seconds.  Neurological:     General: No focal  deficit present.     Mental Status: He is oriented to person, place, and time.      UC Treatments / Results  Labs (all labs ordered are listed, but only abnormal results are displayed) Labs Reviewed - No data to display  EKG   Radiology No results found.  Procedures Procedures (  including critical care time)  Medications Ordered in UC Medications - No data to display  Initial Impression / Assessment and Plan / UC Course  I have reviewed the triage vital signs and the nursing notes.  Pertinent labs & imaging results that were available during my care of the patient were reviewed by me and considered in my medical decision making (see chart for details).     1.  Viral illness with respiratory symptoms: COVID-19 testing Patient is advised to self isolate We will call patient with COVID-19 results Patient is advised to push fluids If patient symptoms worsens i.e. fever, worsening shortness of breath or increasing fatigue patient needs to return to urgent care to be reevaluated. Final Clinical Impressions(s) / UC Diagnoses   Final diagnoses:  None   Discharge Instructions   None    ED Prescriptions    None     PDMP not reviewed this encounter.   Chase Picket, MD 10/29/18 0830

## 2018-10-27 NOTE — ED Triage Notes (Signed)
Patient presents to Urgent Care with complaints of productive cough since 3 days ago. Patient reports he got "one chill and one headache".

## 2018-10-29 LAB — NOVEL CORONAVIRUS, NAA (HOSP ORDER, SEND-OUT TO REF LAB; TAT 18-24 HRS): SARS-CoV-2, NAA: NOT DETECTED

## 2018-11-28 ENCOUNTER — Ambulatory Visit (HOSPITAL_COMMUNITY)
Admission: EM | Admit: 2018-11-28 | Discharge: 2018-11-28 | Disposition: A | Discharge status: Home / Self Care | Payer: Medicaid Other | Attending: Emergency Medicine | Admitting: Emergency Medicine

## 2018-11-28 ENCOUNTER — Other Ambulatory Visit: Payer: Self-pay

## 2018-11-28 ENCOUNTER — Encounter (HOSPITAL_COMMUNITY): Payer: Self-pay

## 2018-11-28 DIAGNOSIS — M542 Cervicalgia: Secondary | ICD-10-CM | POA: Diagnosis not present

## 2018-11-28 MED ORDER — IBUPROFEN 800 MG PO TABS: 800.0000 mg | ORAL_TABLET | Freq: Three times a day (TID) | ORAL | 0 refills | Status: DC

## 2018-11-28 MED ORDER — PREDNISONE 10 MG (21) PO TBPK: ORAL_TABLET | Freq: Every day | ORAL | 0 refills | Status: DC

## 2018-11-28 NOTE — Discharge Instructions (Addendum)
Advised patient to follow up with PCP Take prednisone taper as prescribed  Return to clinic if symptom get worse

## 2018-11-28 NOTE — ED Provider Notes (Signed)
[facility]    CSN: [id number] Arrival date & time: 11/28/18  1139      History   Chief Complaint Chief Complaint  Patient presents with  . neck pain    HPI Homer A Eisenberg is a 58 y.o. male.   58 year old male presented to the urgent care with a complaint neck pain for the past 4 days. Patient stated he woke up and find a sore in his neck.  The pain doesn't radiate.  He has been using hot and cold compress with no relief. Patient stated it hurts to ben d his neck and rated the pain at 10 on a scale of 1-10. He denied fever, chills, confusion, headache, numbness and tingling.  The history is provided by the patient. No language interpreter was used.    Past Medical History:  Diagnosis Date  . Allergy   . Arthritis   . Asthma   . BPH with urinary obstruction   . History of appendicitis   . Lumbar radiculopathy   . Osteoporosis     Patient Active Problem List   Diagnosis Date Noted  . HLD (hyperlipidemia) 04/17/2018  . Type 2 diabetes mellitus ([facility]) 04/17/2018  . Asthma, chronic 04/17/2018  . Alcohol intoxication, uncomplicated ([facility]) 12/16/2017  . Benign prostatic hyperplasia with urinary obstruction 05/12/2015  . DJD (degenerative joint disease) 05/12/2013  . Hip pain, chronic 11/19/2011    Past Surgical History:  Procedure Laterality Date  . APPENDECTOMY    . TOTAL HIP ARTHROPLASTY Right 10/22/2012   Procedure: RIGHT TOTAL HIP ARTHROPLASTY ANTERIOR APPROACH;  Surgeon: Timothy D Murphy, MD;  [facility];  Service: Orthopedics;  Laterality: Right;  . TOTAL HIP ARTHROPLASTY Left 05/12/2013   Procedure: LEFT TOTAL HIP ARTHROPLASTY ANTERIOR APPROACH;  Surgeon: Timothy D Murphy, MD;  [facility];  Service: Orthopedics;  Laterality: Left;       Home Medications    Prior to Admission medications   Medication Sig Start Date End Date Taking? Authorizing Provider  albuterol (PROVENTIL HFA;VENTOLIN HFA) 108 (90 Base) MCG/ACT inhaler Inhale 2 puffs  into the lungs every 4 (four) hours as needed for wheezing or shortness of breath. 12/16/17   Harris, Kimberly S, FNP  atorvastatin (LIPITOR) 40 MG tablet Take 1 tablet (40 mg total) by mouth daily. 12/16/17   Harris, Kimberly S, FNP  benzonatate (TESSALON) 100 MG capsule Take 1 capsule (100 mg total) by mouth every 8 (eight) hours. 10/27/18   Lamptey, Philip O, MD  blood glucose meter kit and supplies Dispense based on patient and insurance preference. Use up to four times daily as directed. (FOR ICD-10 E10.9, E11.9). 01/13/18   Harris, Kimberly S, FNP  fluticasone (FLOVENT DISKUS) 50 MCG/BLIST diskus inhaler Inhale 1 puff into the lungs 2 (two) times daily. 12/16/17   Harris, Kimberly S, FNP  gabapentin (NEURONTIN) 800 MG tablet Take 1 tablet (800 mg total) by mouth 3 (three) times daily. 12/16/17   Harris, Kimberly S, FNP  meloxicam (MOBIC) 7.5 MG tablet Take 1 tablet (7.5 mg total) by mouth daily. 06/12/18   Mani, Mario, PA-C  metFORMIN (GLUCOPHAGE) 500 MG tablet Take 1 tablet (500 mg total) by mouth 2 (two) times daily. 12/16/17   Harris, Kimberly S, FNP  montelukast (SINGULAIR) 10 MG tablet Take 1 tablet (10 mg total) by mouth at bedtime. 12/16/17   Harris, Kimberly S, FNP  oxyCODONE-acetaminophen (PERCOCET) 10-325 MG tablet Take 1 tablet by mouth every 4 (four) hours as needed for pain.      [provider]  SUMAtriptan (IMITREX) 50 MG tablet Take 2 tablets (100 mg total) by mouth daily as needed for migraine or headache (do not exceed 200 mg within 24 hours). May repeat in 2 hours if headache persists or recurs. 01/13/18   Scot Jun, FNP    Family History Family History  Problem Relation Age of Onset  . Diabetes Mother   . Diabetes Father   . Hypertension Father     Social History Social History   Tobacco Use  . Smoking status: Never Smoker  . Smokeless tobacco: Never Used  Substance Use Topics  . Alcohol use: Yes    Alcohol/week: 1.0 standard drinks    Types: 1 Cans of beer  per week    Comment: daily  BEER  LIQUOR OCC  . Drug use: No     Allergies   Penicillins   Review of Systems Review of Systems  Constitutional: Negative for activity change, appetite change, chills, fatigue and fever.  HENT: Negative for sinus pressure, sinus pain and sore throat.   Respiratory: Negative for cough, chest tightness and shortness of breath.   Cardiovascular: Negative for chest pain and leg swelling.  Neurological: Negative for dizziness, weakness, numbness and headaches.  ROS: All other are negatives   Physical Exam Triage Vital Signs ED Triage Vitals  Enc Vitals Group     BP 11/28/18 1212 127/89     Pulse Rate 11/28/18 1212 89     Resp 11/28/18 1212 18     Temp 11/28/18 1212 98.6 F (37 C)     Temp Source 11/28/18 1212 Oral     SpO2 11/28/18 1212 99 %     Weight --      Height --      Head Circumference --      Peak Flow --      Pain Score 11/28/18 1216 10     Pain Loc --      Pain Edu? --      Excl. in [place]? --    No data found.  Updated Vital Signs BP 127/89 (BP Location: Left Arm)   Pulse 89   Temp 98.6 F (37 C) (Oral)   Resp 18   SpO2 99%   Visual Acuity Right Eye Distance:   Left Eye Distance:   Bilateral Distance:    Right Eye Near:   Left Eye Near:    Bilateral Near:     Physical Exam Vitals signs and nursing note reviewed.  Constitutional:      Appearance: Normal appearance. He is normal weight.  HENT:     Right Ear: Tympanic membrane, ear canal and external ear normal. There is no impacted cerumen.     Left Ear: Tympanic membrane, ear canal and external ear normal. There is no impacted cerumen.  Cardiovascular:     Rate and Rhythm: Normal rate and regular rhythm.     Pulses: Normal pulses.     Heart sounds: Normal heart sounds. No murmur. No gallop.   Pulmonary:     Effort: Pulmonary effort is normal. No respiratory distress.     Breath sounds: No wheezing.  Chest:     Chest wall: No tenderness.  Musculoskeletal:         General: Tenderness present.     Comments: Neck tenderness and decrease range of motion  Neurological:     General: No focal deficit present.     Mental Status: He is alert and oriented to person, place,  and time.     Cranial Nerves: No cranial nerve deficit.     Motor: Motor function is intact.     Gait: Gait is intact.      UC Treatments / Results  Labs (all labs ordered are listed, but only abnormal results are displayed) Labs Reviewed - No data to display  EKG   Radiology No results found.  Procedures Procedures (including critical care time)  Medications Ordered in UC Medications - No data to display  Initial Impression / Assessment and Plan / UC Course  I have reviewed the triage vital signs and the nursing notes.  Pertinent labs & imaging results that were available during my care of the patient were reviewed by me and considered in my medical decision making (see chart for details).   Patient is stable and was advised to follow up with PCP if symptom get worse. Advised to take medication as prescribed Final Clinical Impressions(s) / UC Diagnoses   Final diagnoses:  None   Discharge Instructions   None    ED Prescriptions    None     PDMP not reviewed this encounter.   Avegno, Komlanvi S, FNP 11/28/18 1335  

## 2018-11-28 NOTE — ED Triage Notes (Signed)
Pt presents to UC w/ c/o sharp neck pains in back of neck causing headache x4 days. Pt states it hurts to bend neck in any direction.

## 2019-02-10 ENCOUNTER — Telehealth: Payer: Self-pay

## 2019-02-10 NOTE — Telephone Encounter (Signed)
Called patient to do their pre-visit COVID screening.  Call went to voicemail. Unable to do prescreening.  

## 2019-02-11 ENCOUNTER — Inpatient Hospital Stay: Payer: Medicaid Other | Admitting: Internal Medicine

## 2019-06-09 ENCOUNTER — Ambulatory Visit (HOSPITAL_COMMUNITY)
Admission: EM | Admit: 2019-06-09 | Discharge: 2019-06-09 | Disposition: A | Discharge status: Home / Self Care | Payer: Medicaid Other

## 2019-06-09 ENCOUNTER — Other Ambulatory Visit: Payer: Self-pay

## 2019-06-09 ENCOUNTER — Encounter (HOSPITAL_COMMUNITY): Payer: Self-pay

## 2019-06-09 DIAGNOSIS — Z23 Encounter for immunization: Secondary | ICD-10-CM | POA: Diagnosis not present

## 2019-06-09 DIAGNOSIS — S6992XA Unspecified injury of left wrist, hand and finger(s), initial encounter: Secondary | ICD-10-CM

## 2019-06-09 MED ORDER — BACITRACIN ZINC 500 UNIT/GM EX OINT
TOPICAL_OINTMENT | CUTANEOUS | Status: AC
  Filled 2019-06-09: qty 28.35

## 2019-06-09 MED ORDER — SULFAMETHOXAZOLE-TRIMETHOPRIM 800-160 MG PO TABS: 1.0000 | ORAL_TABLET | Freq: Two times a day (BID) | ORAL | 0 refills | Status: AC

## 2019-06-09 MED ORDER — TETANUS-DIPHTH-ACELL PERTUSSIS 5-2.5-18.5 LF-MCG/0.5 IM SUSP
INTRAMUSCULAR | Status: AC
  Filled 2019-06-09: qty 0.5

## 2019-06-09 MED ORDER — BUPIVACAINE HCL (PF) 0.5 % IJ SOLN
INTRAMUSCULAR | Status: AC
  Filled 2019-06-09: qty 10

## 2019-06-09 MED ORDER — TETANUS-DIPHTH-ACELL PERTUSSIS 5-2.5-18.5 LF-MCG/0.5 IM SUSP
0.5000 mL | Freq: Once | INTRAMUSCULAR | Status: AC
  Administered 2019-06-09: 0.5 mL via INTRAMUSCULAR
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
## 2019-06-09 NOTE — ED Triage Notes (Signed)
Pt c/o fish hook in left index finger which occurred approx 1 hour PTA. Pt reports he had tetanus booster approx 7 months ago.

## 2019-06-09 NOTE — Discharge Instructions (Signed)
Take the antibiotic for the next 3 days  Monitor for signs of infection which include, redness, swelling, severe pain, fever or chills, if you notice any of these return  Soak the finger in warm soapy water, 2-3 times a day for the next 2-3 days.  Keep it bandaged until healed

## 2019-09-01 ENCOUNTER — Other Ambulatory Visit: Payer: Self-pay

## 2019-09-01 ENCOUNTER — Ambulatory Visit (HOSPITAL_COMMUNITY)
Admission: EM | Admit: 2019-09-01 | Discharge: 2019-09-01 | Disposition: A | Discharge status: Home / Self Care | Payer: Medicaid Other

## 2019-11-09 DIAGNOSIS — U071 COVID-19: Secondary | ICD-10-CM

## 2019-11-09 HISTORY — DX: COVID-19: U07.1

## 2019-11-28 ENCOUNTER — Ambulatory Visit (INDEPENDENT_AMBULATORY_CARE_PROVIDER_SITE_OTHER): Payer: Medicaid Other

## 2019-11-28 ENCOUNTER — Ambulatory Visit (HOSPITAL_COMMUNITY)
Admission: EM | Admit: 2019-11-28 | Discharge: 2019-11-28 | Disposition: A | Discharge status: Home / Self Care | Payer: Medicaid Other | Attending: Family Medicine | Admitting: Family Medicine

## 2019-11-28 ENCOUNTER — Other Ambulatory Visit: Payer: Self-pay

## 2019-11-28 DIAGNOSIS — R5383 Other fatigue: Secondary | ICD-10-CM | POA: Diagnosis not present

## 2019-11-28 DIAGNOSIS — R6883 Chills (without fever): Secondary | ICD-10-CM

## 2019-11-28 DIAGNOSIS — R52 Pain, unspecified: Secondary | ICD-10-CM | POA: Insufficient documentation

## 2019-11-28 DIAGNOSIS — B349 Viral infection, unspecified: Secondary | ICD-10-CM | POA: Diagnosis present

## 2019-11-28 DIAGNOSIS — R059 Cough, unspecified: Secondary | ICD-10-CM | POA: Insufficient documentation

## 2019-11-28 DIAGNOSIS — Z20822 Contact with and (suspected) exposure to covid-19: Secondary | ICD-10-CM | POA: Diagnosis present

## 2019-11-28 LAB — RESPIRATORY PANEL BY RT PCR (FLU A&B, COVID)
Influenza A by PCR: NEGATIVE
Influenza B by PCR: NEGATIVE
SARS Coronavirus 2 by RT PCR: POSITIVE — AB

## 2019-11-28 MED ORDER — PREDNISONE 10 MG (21) PO TBPK: ORAL_TABLET | Freq: Every day | ORAL | 0 refills | Status: AC

## 2019-11-28 MED ORDER — BENZONATATE 100 MG PO CAPS: 100.0000 mg | ORAL_CAPSULE | Freq: Three times a day (TID) | ORAL | 0 refills | Status: DC

## 2019-11-28 NOTE — Discharge Instructions (Addendum)
Chest xray today is negative for pneumonia today.  I have sent in a prednisone taper for you to take for 6 days. 6 tablets on day one, 5 tablets on day two, 4 tablets on day three, 3 tablets on day four, 2 tablets on day five, and 1 tablet on day six.  I have sent in tessalon perles for you to use one capsule every 8 hours as needed for cough.  Your COVID and Flu tests are pending.  You should self quarantine until the test results are back.    Take Tylenol or ibuprofen as needed for fever or discomfort.  Rest and keep yourself hydrated.    Follow-up with your primary care provider if your symptoms are not improving.

## 2019-11-28 NOTE — ED Provider Notes (Signed)
[place]   [id number] 11/28/19 Arrival Time: 3710   CC: COVID symptoms  SUBJECTIVE: History from: patient.  [name] Pope is a 59 y.o. male who presents with abrupt onset of nasal congestion, PND, and persistent dry cough for the last 2 weeks. Reports fatigue, body aches, SOB, chills as well. Admits to positive Covid exposure. Has negative history of Covid. Has not completed Covid vaccines. Has not taken OTC medications for this. Cough is aggravated with conversation and activity. Denies previous symptoms in the past. Denies fever, sinus pain, rhinorrhea, sore throat, wheezing, chest pain, nausea, changes in bowel or bladder habits.    ROS: As per HPI.  All other pertinent ROS negative.     Past Medical History:  Diagnosis Date  . Allergy   . Arthritis   . Asthma   . BPH with urinary obstruction   . History of appendicitis   . Lumbar radiculopathy   . Osteoporosis    Past Surgical History:  Procedure Laterality Date  . APPENDECTOMY    . TOTAL HIP ARTHROPLASTY Right 10/22/2012   Procedure: RIGHT TOTAL HIP ARTHROPLASTY ANTERIOR APPROACH;  Surgeon: Renette Butters, MD;  Location: [facility];  Service: Orthopedics;  Laterality: Right;  . TOTAL HIP ARTHROPLASTY Left 05/12/2013   Procedure: LEFT TOTAL HIP ARTHROPLASTY ANTERIOR APPROACH;  Surgeon: Renette Butters, MD;  Location: [facility];  Service: Orthopedics;  Laterality: Left;   Allergies  Allergen Reactions  . Penicillins     Childhood Has patient had a PCN reaction causing immediate rash, facial/tongue/throat swelling, SOB or lightheadedness with hypotension: n/a Has patient had a PCN reaction causing severe rash involving mucus membranes or skin necrosis: n/a Has patient had a PCN reaction that required hospitalization: n/a Has patient had a PCN reaction occurring within the last 10 years: n/a If all of the above answers are "NO", then may proceed with Cephalosporin use.    No current facility-administered  medications on file prior to encounter.   Current Outpatient Medications on File Prior to Encounter  Medication Sig Dispense Refill  . atorvastatin (LIPITOR) 40 MG tablet Take 1 tablet (40 mg total) by mouth daily. 90 tablet 1  . blood glucose meter kit and supplies Dispense based on patient and insurance preference. Use up to four times daily as directed. (FOR ICD-10 E10.9, E11.9). 1 each 0  . gabapentin (NEURONTIN) 300 MG capsule Take 300 mg by mouth at bedtime.    . gabapentin (NEURONTIN) 800 MG tablet Take 1 tablet (800 mg total) by mouth 3 (three) times daily. 90 tablet 1  . metFORMIN (GLUCOPHAGE) 500 MG tablet Take 1 tablet (500 mg total) by mouth 2 (two) times daily. 180 tablet 1  . metFORMIN (GLUCOPHAGE) 850 MG tablet Take 850 mg by mouth 2 (two) times daily.    . montelukast (SINGULAIR) 10 MG tablet Take 1 tablet (10 mg total) by mouth at bedtime. 30 tablet 3  . oxyCODONE-acetaminophen (PERCOCET) 10-325 MG tablet Take 1 tablet by mouth every 4 (four) hours as needed for pain.    . SUMAtriptan (IMITREX) 50 MG tablet Take 2 tablets (100 mg total) by mouth daily as needed for migraine or headache (do not exceed 200 mg within 24 hours). May repeat in 2 hours if headache persists or recurs. 20 tablet 1   Social History   Socioeconomic History  . Marital status: Single    Spouse name: Not on file  . Number of children: 5  . Years of education: Not on  file  . Highest education level: Not on file  Occupational History  . Occupation: disabled  Tobacco Use  . Smoking status: Never Smoker  . Smokeless tobacco: Never Used  Vaping Use  . Vaping Use: Never used  Substance and Sexual Activity  . Alcohol use: Yes    Alcohol/week: 1.0 standard drink    Types: 1 Cans of beer per week    Comment: daily  BEER  LIQUOR OCC  . Drug use: No  . Sexual activity: Not on file  Other Topics Concern  . Not on file  Social History Narrative  . Not on file   Social Determinants of Health    Financial Resource Strain:   . Difficulty of Paying Living Expenses: Not on file  Food Insecurity:   . Worried About Charity fundraiser in the Last Year: Not on file  . Ran Out of Food in the Last Year: Not on file  Transportation Needs:   . Lack of Transportation (Medical): Not on file  . Lack of Transportation (Non-Medical): Not on file  Physical Activity:   . Days of Exercise per Week: Not on file  . Minutes of Exercise per Session: Not on file  Stress:   . Feeling of Stress : Not on file  Social Connections:   . Frequency of Communication with Friends and Family: Not on file  . Frequency of Social Gatherings with Friends and Family: Not on file  . Attends Religious Services: Not on file  . Active Member of Clubs or Organizations: Not on file  . Attends Archivist Meetings: Not on file  . Marital Status: Not on file  Intimate Partner Violence:   . Fear of Current or Ex-Partner: Not on file  . Emotionally Abused: Not on file  . Physically Abused: Not on file  . Sexually Abused: Not on file   Family History  Problem Relation Age of Onset  . Diabetes Mother   . Diabetes Father   . Hypertension Father     OBJECTIVE:  Vitals:   11/28/19 1234  BP: 119/84  Pulse: 87  Resp: 18  Temp: 99.1 F (37.3 C)  TempSrc: Oral  SpO2: 97%     General appearance: alert; appears fatigued, but nontoxic; speaking in full sentences and tolerating own secretions HEENT: NCAT; Ears: EACs clear, TMs pearly gray; Eyes: PERRL.  EOM grossly intact. Sinuses: nontender; Nose: nares patent without rhinorrhea, Throat: oropharynx clear, tonsils non erythematous or enlarged, uvula midline  Neck: supple with LAD Lungs: unlabored respirations, symmetrical air entry; cough: mild; no respiratory distress; coarse lung sounds to bilateral bases Heart: regular rate and rhythm.  Radial pulses 2+ symmetrical bilaterally Skin: warm and dry Psychological: alert and cooperative; normal mood and  affect  LABS:  No results found for this or any previous visit (from the past 24 hour(s)).   ASSESSMENT & PLAN:  1. Viral illness   2. Exposure to COVID-19 virus   3. Cough   4. Other fatigue   5. Body aches   6. Chills     Meds ordered this encounter  Medications  . predniSONE (STERAPRED UNI-PAK 21 TAB) 10 MG (21) TBPK tablet    Sig: Take by mouth daily for 6 days. Take 6 tablets on day 1, 5 tablets on day 2, 4 tablets on day 3, 3 tablets on day 4, 2 tablets on day 5, 1 tablet on day 6    Dispense:  21 tablet    Refill:  0    Order Specific Question:   Supervising Provider    Answer:   Chase Picket [id number]  . benzonatate (TESSALON) 100 MG capsule    Sig: Take 1 capsule (100 mg total) by mouth every 8 (eight) hours.    Dispense:  21 capsule    Refill:  0    Order Specific Question:   Supervising Provider    Answer:   Chase Picket [id number]   Will treat as bronchitis for now Covid/flu testing pending Prescribed steroid taper Prescribed benzonatate   COVID/Flu testing ordered.  It will take between 1-2 days for test results.  Someone will contact you regarding abnormal results.    Patient should remain in quarantine until they have received Covid results.  If negative you may resume normal activities (go back to work/school) while practicing hand hygiene, social distance, and mask wearing.  If positive, patient should remain in quarantine for 10 days from symptom onset AND greater than 72 hours after symptoms resolution (absence of fever without the use of fever-reducing medication and improvement in respiratory symptoms), whichever is longer Get plenty of rest and push fluids Use OTC zyrtec for nasal congestion, runny nose, and/or sore throat Use OTC flonase for nasal congestion and runny nose Use medications daily for symptom relief Use OTC medications like ibuprofen or tylenol as needed fever or pain Call or go to the ED if you have any new or worsening  symptoms such as fever, worsening cough, shortness of breath, chest tightness, chest pain, turning blue, changes in mental status.  Reviewed expectations re: course of current medical issues. Questions answered. Outlined signs and symptoms indicating need for more acute intervention. Patient verbalized understanding. After Visit Summary given.         Faustino Congress, NP 11/28/19 1410

## 2019-11-28 NOTE — ED Triage Notes (Signed)
Pt presents with bodyaches, coughing, headaches x 2 weeks. He states that he has been coughing up yellow mucus. Pt denies emesis and nausea. He states that he has not been around anyone that is positive for covid.

## 2019-11-29 ENCOUNTER — Encounter: Payer: Self-pay | Admitting: Nurse Practitioner

## 2019-11-29 ENCOUNTER — Telehealth: Payer: Self-pay | Admitting: Nurse Practitioner

## 2019-11-29 DIAGNOSIS — E669 Obesity, unspecified: Secondary | ICD-10-CM | POA: Insufficient documentation

## 2019-11-29 NOTE — Telephone Encounter (Signed)
I called [name] Pope to discuss Covid symptoms and the use of Sotrovimab, a monoclonal antibody infusion for those with mild to moderate Covid symptoms and at a high risk of hospitalization.   Pt does not qualify for infusion therapy as pt's symptoms first presented > 10 days prior to timing of infusion. Symptoms tier reviewed as well as criteria for ending isolation. Preventative practices reviewed. Patient verbalized understanding    Patient Active Problem List   Diagnosis Date Noted  . Obesity   . COVID-19 virus infection 11/2019  . HLD (hyperlipidemia) 04/17/2018  . Type 2 diabetes mellitus (HCC) 04/17/2018  . Asthma, chronic 04/17/2018  . Alcohol intoxication, uncomplicated (HCC) 12/16/2017  . Benign prostatic hyperplasia with urinary obstruction 05/12/2015  . DJD (degenerative joint disease) 05/12/2013  . Hip pain, chronic 11/19/2011    Nicolasa Ducking, NP

## 2019-12-01 ENCOUNTER — Ambulatory Visit (HOSPITAL_COMMUNITY)
Admission: EM | Admit: 2019-12-01 | Discharge: 2019-12-01 | Disposition: A | Discharge status: Home / Self Care | Payer: Medicaid Other

## 2019-12-01 ENCOUNTER — Encounter (HOSPITAL_COMMUNITY): Payer: Self-pay

## 2019-12-01 ENCOUNTER — Emergency Department (HOSPITAL_COMMUNITY): Payer: Medicaid Other

## 2019-12-01 ENCOUNTER — Emergency Department (HOSPITAL_COMMUNITY)
Admission: EM | Admit: 2019-12-01 | Discharge: 2019-12-01 | Disposition: A | Discharge status: Home / Self Care | Payer: Medicaid Other | Attending: Emergency Medicine | Admitting: Emergency Medicine

## 2019-12-01 ENCOUNTER — Other Ambulatory Visit: Payer: Self-pay

## 2019-12-01 DIAGNOSIS — Z7984 Long term (current) use of oral hypoglycemic drugs: Secondary | ICD-10-CM | POA: Diagnosis not present

## 2019-12-01 DIAGNOSIS — E785 Hyperlipidemia, unspecified: Secondary | ICD-10-CM | POA: Insufficient documentation

## 2019-12-01 DIAGNOSIS — Z79899 Other long term (current) drug therapy: Secondary | ICD-10-CM | POA: Diagnosis not present

## 2019-12-01 DIAGNOSIS — R0602 Shortness of breath: Secondary | ICD-10-CM | POA: Diagnosis not present

## 2019-12-01 DIAGNOSIS — U071 COVID-19: Secondary | ICD-10-CM | POA: Diagnosis not present

## 2019-12-01 DIAGNOSIS — E1169 Type 2 diabetes mellitus with other specified complication: Secondary | ICD-10-CM | POA: Insufficient documentation

## 2019-12-01 DIAGNOSIS — Z96643 Presence of artificial hip joint, bilateral: Secondary | ICD-10-CM | POA: Diagnosis not present

## 2019-12-01 DIAGNOSIS — R55 Syncope and collapse: Secondary | ICD-10-CM | POA: Diagnosis present

## 2019-12-01 DIAGNOSIS — J45909 Unspecified asthma, uncomplicated: Secondary | ICD-10-CM | POA: Diagnosis not present

## 2019-12-01 LAB — BASIC METABOLIC PANEL
Anion gap: 13 (ref 5–15)
BUN: 11 mg/dL (ref 6–20)
CO2: 24 mmol/L (ref 22–32)
Calcium: 8.9 mg/dL (ref 8.9–10.3)
Chloride: 101 mmol/L (ref 98–111)
Creatinine, Ser: 0.89 mg/dL (ref 0.61–1.24)
GFR, Estimated: >60 mL/min (ref >60)
Glucose, Bld: 124 mg/dL — ABNORMAL HIGH (ref 70–99)
Potassium: 3.7 mmol/L (ref 3.5–5.1)
Sodium: 138 mmol/L (ref 135–145)

## 2019-12-01 LAB — CBC
HCT: 39.6 % (ref 39.0–52.0)
Hemoglobin: 12.3 g/dL — ABNORMAL LOW (ref 13.0–17.0)
MCH: 26.6 pg (ref 26.0–34.0)
MCHC: 31.1 g/dL (ref 30.0–36.0)
MCV: 85.7 fL (ref 80.0–100.0)
Platelets: 277 10*3/uL (ref 150–400)
RBC: 4.62 MIL/uL (ref 4.22–5.81)
RDW: 14.2 % (ref 11.5–15.5)
WBC: 9.9 10*3/uL (ref 4.0–10.5)
nRBC: 0 % (ref 0.0–0.2)

## 2019-12-01 LAB — TROPONIN I (HIGH SENSITIVITY): Troponin I (High Sensitivity): 3 ng/L (ref <18)

## 2019-12-01 LAB — D-DIMER, QUANTITATIVE: D-Dimer, Quant: 1.29 ug/mL-FEU — ABNORMAL HIGH (ref 0.00–0.50)

## 2019-12-01 MED ORDER — IOHEXOL 350 MG/ML SOLN
75.0000 mL | Freq: Once | INTRAVENOUS | Status: AC | PRN
  Administered 2019-12-01: 75 mL via INTRAVENOUS

## 2019-12-01 MED ORDER — SODIUM CHLORIDE 0.9 % IV BOLUS
1000.0000 mL | Freq: Once | INTRAVENOUS | Status: AC
  Administered 2019-12-01: 1000 mL via INTRAVENOUS

## 2019-12-01 NOTE — Discharge Instructions (Signed)
If you start having severe shortness of breath even when resting please return to the hospital.  You need to take it easy over the next 4 to 5 days.  Make sure you are drinking plenty of fluids and getting rest.  It is good for you to get up and move around her house and try laying on your side or your stomach when possible.

## 2019-12-01 NOTE — ED Triage Notes (Signed)
Pt tested positive for COVID 3 days ago, [place] felt [place]zy and passed out. Denies any injuries from episode. Pt sent from UC for further evaluation.

## 2019-12-01 NOTE — ED Notes (Signed)
Discharge instructions provided to patient. Verbalized understanding. Alert and oriented. Escorted out of ED via w/c. °

## 2019-12-01 NOTE — ED Notes (Signed)
Patient is being discharged from the Urgent Care Center and sent to the Emergency Department via wheelchair by staff. Per Wallis Bamberg, PA-C, patient is stable but in need of higher level of care due to COVID-19 and LOC. Patient is aware and verbalizes understanding of plan of care.  Vitals:   12/01/19 1019  BP: 128/73  Pulse: 81  Temp: 98.2 F (36.8 C)  SpO2: 100%

## 2019-12-01 NOTE — ED Provider Notes (Signed)
Assumed care of patient at 4:30 PM.  CT is negative for PE but does show mild Covid pneumonia.  Patient is 10 days of symptoms and outside the window for Mab infusion.  Patient sats are 96% on room air and he is otherwise well-appearing.  Patient discharged home and given close return precautions.   Gwyneth Sprout, MD 12/01/19 [phone]

## 2019-12-01 NOTE — ED Notes (Signed)
ED Provider at bedside. 

## 2019-12-01 NOTE — ED Provider Notes (Signed)
And this [name] Pope Note   CSN: [id number] Arrival date & time: 12/01/19  1036     History Chief Complaint  Patient presents with  . Covid Positive  . Loss of Consciousness    [name] Pope is a 59 y.o. male.  HPI 59 year old male presents with syncope.  The patient states that he is been having a cough for about 8 days.  3 days ago he got tested for Covid and was positive.  [place] he felt acutely lightheaded for about 15 minutes and eventually had to lay down on a cool surface.  He started feeling short of breath last night and feeling like he had more phlegm yesterday.  The shortness of breath is continued today.  No chest pain.  A little bit of a headache but no sudden severe headache.  He felt like he had a fever today.  No vomiting but a little bit of diarrhea today.  Past Medical History:  Diagnosis Date  . Allergy   . Arthritis   . Asthma   . BPH with urinary obstruction   . COVID-19 virus infection 11/2019  . History of appendicitis   . Lumbar radiculopathy   . Obesity   . Osteoporosis     Patient Active Problem List   Diagnosis Date Noted  . Obesity   . COVID-19 virus infection 11/2019  . HLD (hyperlipidemia) 04/17/2018  . Type 2 diabetes mellitus ([facility]) 04/17/2018  . Asthma, chronic 04/17/2018  . Alcohol intoxication, uncomplicated ([place]) 86/57/8469  . Benign prostatic hyperplasia with urinary obstruction 05/12/2015  . DJD (degenerative joint disease) 05/12/2013  . Hip pain, chronic 11/19/2011    Past Surgical History:  Procedure Laterality Date  . APPENDECTOMY    . 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 TOTAL HIP ARTHROPLASTY Left 05/12/2013   Procedure: LEFT TOTAL HIP ARTHROPLASTY ANTERIOR APPROACH;   Surgeon: Renette Butters, MD;  Location: [facility];  Service: Orthopedics;  Laterality: Left;       Family History  Problem Relation Age of Onset  . Diabetes Mother   . Diabetes Father   . Hypertension Father     Social History   Tobacco Use  . Smoking status: Never Smoker  . Smokeless tobacco: Never Used  Vaping Use  . Vaping Use: Never used  Substance Use Topics  . Alcohol use: Yes    Alcohol/week: 1.0 standard drink    Types: 1 Cans of beer per week    Comment: daily  BEER  LIQUOR OCC  . Drug use: No    Home Medications Prior to Admission medications   Medication Sig Start Date End Date Taking? Authorizing Pope  atorvastatin (LIPITOR) 40 MG tablet Take 1 tablet (40 mg total) by mouth daily. 12/16/17  Yes Scot Jun, FNP  benzonatate (TESSALON) 100 MG capsule Take 1 capsule (100 mg total) by mouth every 8 (eight) hours. Patient taking differently: Take 100 mg by mouth 3 (three) times daily as needed for cough.  11/28/19  Yes Faustino Congress, NP  cholecalciferol (VITAMIN D3) 25 MCG (1000 UNIT) tablet Take 1,000 Units by mouth daily.   Yes Pope, Historical, MD  gabapentin (NEURONTIN) 300 MG capsule Take 300 mg by mouth at bedtime as needed (leg pain).  04/09/19  Yes Pope, Historical, MD  gabapentin (NEURONTIN) 800 MG tablet Take 1 tablet (800 mg total) by mouth 3 (three) times daily. 12/16/17  Yes Scot Jun, FNP  metFORMIN (GLUCOPHAGE) 500 MG tablet Take 1 tablet (500 mg total) by mouth 2 (two) times daily. 12/16/17  Yes Scot Jun, FNP  montelukast (SINGULAIR) 10 MG tablet Take 1 tablet (10 mg total) by mouth at bedtime. 12/16/17  Yes Scot Jun, FNP  oxyCODONE-acetaminophen (PERCOCET) 10-325 MG tablet Take 1 tablet by mouth every 4 (four) hours as needed for pain.   Yes Pope, Historical, MD  predniSONE (STERAPRED UNI-PAK 21 TAB) 10 MG (21) TBPK tablet Take by mouth daily for 6 days. Take 6 tablets on day 1, 5 tablets on day 2, 4  tablets on day 3, 3 tablets on day 4, 2 tablets on day 5, 1 tablet on day 6 11/28/19 12/04/19 Yes Faustino Congress, NP  Pseudoephedrine-Ibuprofen (ADVIL COLD & SINUS LIQUI-GELS PO) Take 2 capsules by mouth every 6 (six) hours as needed (headache).   Yes Pope, Historical, MD  SUMAtriptan (IMITREX) 50 MG tablet Take 2 tablets (100 mg total) by mouth daily as needed for migraine or headache (do not exceed 200 mg within 24 hours). May repeat in 2 hours if headache persists or recurs. 01/13/18  Yes Scot Jun, FNP  blood glucose meter kit and supplies Dispense based on patient and insurance preference. Use up to four times daily as directed. (FOR ICD-10 E10.9, E11.9). 01/13/18   Scot Jun, FNP    Allergies    Penicillins  Review of Systems   Review of Systems  Constitutional: Positive for diaphoresis. Negative for fever.  Respiratory: Positive for cough and shortness of breath.   Cardiovascular: Negative for chest pain and palpitations.  Gastrointestinal: Positive for diarrhea. Negative for abdominal pain and vomiting.  Neurological: Positive for light-headedness and headaches. Negative for syncope.  All other systems reviewed and are negative.   Physical Exam Updated Vital Signs BP (!) 132/91   Pulse 72   Temp 98.4 F (36.9 C) (Oral)   Resp 18   Ht _0  (1.753 m)   Wt 98 kg   SpO2 94%   BMI 31.90 kg/m   Physical Exam Vitals and nursing note reviewed.  Constitutional:      General: He is not in acute distress.    Appearance: He is well-developed. He is not ill-appearing or diaphoretic.  HENT:     Head: Normocephalic and atraumatic.     Right Ear: External ear normal.     Left Ear: External ear normal.     Nose: Nose normal.  Eyes:     General:        Right eye: No discharge.        Left eye: No discharge.     Extraocular Movements: Extraocular movements intact.  Cardiovascular:     Rate and Rhythm: Normal rate and regular rhythm.     Heart sounds:  Normal heart sounds.  Pulmonary:     Effort: Pulmonary effort is normal.     Breath sounds: Normal breath sounds.  Abdominal:     General: There is no distension.     Palpations: Abdomen is soft.     Tenderness: There is no abdominal tenderness.  Musculoskeletal:     Cervical back: Neck supple.  Skin:    General: Skin is warm and dry.  Neurological:     Mental Status: He is alert.  Comments: CN 3-12 grossly intact. 5/5 strength in all 4 extremities. Grossly normal sensation. Normal finger to nose.   Psychiatric:        Mood and Affect: Mood is not anxious.     ED Results / Procedures / Treatments   Labs (all labs ordered are listed, but only abnormal results are displayed) Labs Reviewed  BASIC METABOLIC PANEL - Abnormal; Notable for the following components:      Result Value   Glucose, Bld 124 (*)    All other components within normal limits  CBC - Abnormal; Notable for the following components:   Hemoglobin 12.3 (*)    All other components within normal limits  D-DIMER, QUANTITATIVE (NOT AT [facility]) - Abnormal; Notable for the following components:   D-Dimer, Quant 1.29 (*)    All other components within normal limits  TROPONIN I (HIGH SENSITIVITY)    EKG EKG Interpretation  Date/Time:  Tuesday December 01 2019 11:36:04 EST Ventricular Rate:  87 PR Interval:  176 QRS Duration: 80 QT Interval:  377 QTC Calculation: 454 R Axis:   59 Text Interpretation: Sinus rhythm no acute ST/T changes similar to earlier in the day Confirmed by Sherwood Gambler [phone]) on 12/01/2019 12:36:26 PM   Radiology DG Chest Portable 1 View  Result Date: 12/01/2019 CLINICAL DATA:  Shortness of breath.  COVID.  Syncope. EXAM: PORTABLE CHEST 1 VIEW COMPARISON:  11/28/2019.  05/01/2013. FINDINGS: Mediastinum and hilar structures appear stable. Heart size normal. Low lung volumes. Very mild bilateral interstitial prominence cannot be excluded. Mild pneumonitis cannot be excluded. No pleural  effusion or pneumothorax. No acute bony abnormality. IMPRESSION: Low lung volumes. Very mild bilateral interstitial prominence cannot be excluded. Mild pneumonitis cannot be excluded. Electronically Signed   By: Marcello Moores  Register   On: 12/01/2019 12:40    Procedures Procedures (including critical care time)  Medications Ordered in ED Medications  sodium chloride 0.9 % bolus 1,000 mL (0 mLs Intravenous Stopped 12/01/19 1451)    ED Course  I have reviewed the triage vital signs and the nursing notes.  Pertinent labs & imaging results that were available during my care of the patient were reviewed by me and considered in my medical decision making (see chart for details).    MDM Rules/Calculators/A&P                          Patient has had mild Covid symptoms for a week or more but then now is having short of breath and couple episode.  Given this PE work-up was obtained but CTA is currently pending.  Care to Dr. Maryan Rued. No hypoxia here. I highly doubt cardiac cause of syncope.  Final Clinical Impression(s) / ED Diagnoses Final diagnoses:  None    Rx / DC Orders ED Discharge Orders    None       Sherwood Gambler, MD 12/01/19 [phone]

## 2019-12-01 NOTE — ED Triage Notes (Signed)
Pt c/o covid positive x 3 days. Pt states last night he felt short of breath and sweaty. Pt states this morning he had [place] and passed out on the stairs. Pt states EMS came to the scene and wanted to take him to the hospital but he refused because he had to take a friend home.

## 2019-12-09 ENCOUNTER — Other Ambulatory Visit: Payer: Self-pay | Admitting: Pediatrics

## 2019-12-09 DIAGNOSIS — R7989 Other specified abnormal findings of blood chemistry: Secondary | ICD-10-CM

## 2019-12-09 DIAGNOSIS — R000 Tachycardia, unspecified: Secondary | ICD-10-CM

## 2020-03-26 ENCOUNTER — Ambulatory Visit (HOSPITAL_COMMUNITY)
Admission: EM | Admit: 2020-03-26 | Discharge: 2020-03-26 | Disposition: A | Discharge status: Home / Self Care | Payer: Medicaid Other | Attending: Medical Oncology | Admitting: Medical Oncology

## 2020-03-26 ENCOUNTER — Other Ambulatory Visit: Payer: Self-pay

## 2020-03-26 ENCOUNTER — Encounter (HOSPITAL_COMMUNITY): Payer: Self-pay | Admitting: Emergency Medicine

## 2020-03-26 DIAGNOSIS — R490 Dysphonia: Secondary | ICD-10-CM | POA: Diagnosis not present

## 2020-03-26 DIAGNOSIS — R059 Cough, unspecified: Secondary | ICD-10-CM

## 2020-03-26 MED ORDER — FLUTICASONE PROPIONATE 50 MCG/ACT NA SUSP: 2.0000 | Freq: Every day | NASAL | 0 refills | Status: DC

## 2020-03-26 MED ORDER — MONTELUKAST SODIUM 10 MG PO TABS: 10.0000 mg | ORAL_TABLET | Freq: Every day | ORAL | 0 refills | Status: AC

## 2020-03-26 MED ORDER — BENZONATATE 200 MG PO CAPS: 200.0000 mg | ORAL_CAPSULE | Freq: Three times a day (TID) | ORAL | 0 refills | Status: DC | PRN

## 2020-03-26 NOTE — ED Provider Notes (Signed)
[place]    CSN: [id number] Arrival date & time: 03/26/20  1329     History   Chief Complaint Chief Complaint  Patient presents with  . Cough    HPI [name] Pope is a 60 y.o. male.   HPI   Cough: Pt reports that they have had a dry cough for the past week. Sore throat and hoarseness has now started from the cough. Cough worse at night. PMH significant for asthma. He reports that he has not had to use his inhaler for symptoms. No fever, chest pain, hemoptysis, productive cough.    Past Medical History:  Diagnosis Date  . Allergy   . Arthritis   . Asthma   . BPH with urinary obstruction   . COVID-19 virus infection 11/2019  . History of appendicitis   . Lumbar radiculopathy   . Obesity   . Osteoporosis     Patient Active Problem List   Diagnosis Date Noted  . Obesity   . COVID-19 virus infection 11/2019  . HLD (hyperlipidemia) 04/17/2018  . Type 2 diabetes mellitus ([place]) 04/17/2018  . Asthma, chronic 04/17/2018  . Alcohol intoxication, uncomplicated ([place]) 76/72/0947  . Benign prostatic hyperplasia with urinary obstruction 05/12/2015  . DJD (degenerative joint disease) 05/12/2013  . Hip pain, chronic 11/19/2011    Past Surgical History:  Procedure Laterality Date  . APPENDECTOMY    . TOTAL HIP ARTHROPLASTY Right 10/22/2012   Procedure: RIGHT TOTAL HIP ARTHROPLASTY ANTERIOR APPROACH;  Surgeon: Renette Butters, MD;  Location: [facility];  Service: Orthopedics;  Laterality: Right;  . TOTAL HIP ARTHROPLASTY Left 05/12/2013   Procedure: LEFT TOTAL HIP ARTHROPLASTY ANTERIOR APPROACH;  Surgeon: Renette Butters, MD;  Location: [facility];  Service: Orthopedics;  Laterality: Left;       Home Medications    Prior to Admission medications   Medication Sig Start Date End Date Taking? Authorizing Provider  atorvastatin (LIPITOR) 40 MG tablet Take 1 tablet (40 mg total) by mouth daily. 12/16/17  Yes Scot Jun, FNP  gabapentin (NEURONTIN) 300 MG capsule  Take 300 mg by mouth at bedtime as needed (leg pain).  04/09/19  Yes [provider]  gabapentin (NEURONTIN) 800 MG tablet Take 1 tablet (800 mg total) by mouth 3 (three) times daily. 12/16/17  Yes Scot Jun, FNP  metFORMIN (GLUCOPHAGE) 500 MG tablet Take 1 tablet (500 mg total) by mouth 2 (two) times daily. 12/16/17  Yes Scot Jun, FNP  oxyCODONE-acetaminophen (PERCOCET) 10-325 MG tablet Take 1 tablet by mouth every 4 (four) hours as needed for pain.   Yes [provider]  benzonatate (TESSALON) 100 MG capsule Take 1 capsule (100 mg total) by mouth every 8 (eight) hours. Patient taking differently: Take 100 mg by mouth 3 (three) times daily as needed for cough. 11/28/19   Faustino Congress, NP  blood glucose meter kit and supplies Dispense based on patient and insurance preference. Use up to four times daily as directed. (FOR ICD-10 E10.9, E11.9). 01/13/18   Scot Jun, FNP  cholecalciferol (VITAMIN D3) 25 MCG (1000 UNIT) tablet Take 1,000 Units by mouth daily.    [provider]  montelukast (SINGULAIR) 10 MG tablet Take 1 tablet (10 mg total) by mouth at bedtime. 12/16/17   Scot Jun, FNP  Pseudoephedrine-Ibuprofen (ADVIL COLD & SINUS LIQUI-GELS PO) Take 2 capsules by mouth every 6 (six) hours as needed (headache).    [provider]  SUMAtriptan (IMITREX) 50 MG tablet  Take 2 tablets (100 mg total) by mouth daily as needed for migraine or headache (do not exceed 200 mg within 24 hours). May repeat in 2 hours if headache persists or recurs. 01/13/18   Scot Jun, FNP    Family History Family History  Problem Relation Age of Onset  . Diabetes Mother   . Diabetes Father   . Hypertension Father     Social History Social History   Tobacco Use  . Smoking status: Never Smoker  . Smokeless tobacco: Never Used  Vaping Use  . Vaping Use: Never used  Substance Use Topics  . Alcohol use: Yes    Alcohol/week: 1.0 standard  drink    Types: 1 Cans of beer per week    Comment: daily  BEER  LIQUOR OCC  . Drug use: No     Allergies   Penicillins   Review of Systems Review of Systems  As stated above in HPI Physical Exam Triage Vital Signs ED Triage Vitals  Enc Vitals Group     BP 03/26/20 1519 123/83     Pulse Rate 03/26/20 1519 86     Resp 03/26/20 1519 19     Temp 03/26/20 1519 98 F (36.7 C)     Temp Source 03/26/20 1519 Oral     SpO2 03/26/20 1519 99 %     Weight --      Height --      Head Circumference --      Peak Flow --      Pain Score 03/26/20 1516 3     Pain Loc --      Pain Edu? --      Excl. in [place]? --    No data found.  Updated Vital Signs BP 123/83 (BP Location: Left Arm)   Pulse 86   Temp 98 F (36.7 C) (Oral)   Resp 19   SpO2 99%   Physical Exam Vitals and nursing note reviewed.  Constitutional:      General: He is not in acute distress.    Appearance: Normal appearance. He is obese. He is not ill-appearing, toxic-appearing or diaphoretic.  HENT:     Head: Normocephalic and atraumatic.     Right Ear: Tympanic membrane, ear canal and external ear normal.     Left Ear: Tympanic membrane, ear canal and external ear normal.     Nose: Nose normal. No congestion or rhinorrhea.     Mouth/Throat:     Mouth: Mucous membranes are moist.     Pharynx: No oropharyngeal exudate or posterior oropharyngeal erythema.  Eyes:     Extraocular Movements: Extraocular movements intact.     Pupils: Pupils are equal, round, and reactive to light.  Cardiovascular:     Rate and Rhythm: Normal rate and regular rhythm.     Heart sounds: Normal heart sounds.  Pulmonary:     Effort: Pulmonary effort is normal. No respiratory distress.     Breath sounds: Normal breath sounds. No stridor. No wheezing, rhonchi or rales.  Chest:     Chest wall: No tenderness.  Musculoskeletal:     Cervical back: Normal range of motion and neck supple.  Lymphadenopathy:     Cervical: No cervical  adenopathy.  Skin:    General: Skin is warm.  Neurological:     Mental Status: He is alert and oriented to person, place, and time.      UC Treatments / Results  Labs (all labs ordered are listed, but  only abnormal results are displayed) Labs Reviewed - No data to display  EKG   Radiology No results found.  Procedures Procedures (including critical care time)  Medications Ordered in UC Medications - No data to display  Initial Impression / Assessment and Plan / UC Course  I have reviewed the triage vital signs and the nursing notes.  Pertinent labs & imaging results that were available during my care of the patient were reviewed by me and considered in my medical decision making (see chart for details).     New. Likely bronchitis vs asthma exacerbation. Will treat with albuterol PRN and directed on his prescription which he reports that he has on hand. Restarting him on Singulair, adding on flonase, and sending in tessalon to help him at night if needed. Discussed red flag signs and symptoms. Holding off on COVID-19 testing today as he is afebrile without SOB and is outside of treatment window.    Final Clinical Impressions(s) / UC Diagnoses   Final diagnoses:  None   Discharge Instructions   None    ED Prescriptions    None     PDMP not reviewed this encounter.   Hughie Closs, [place] 03/26/20 1541

## 2020-03-26 NOTE — ED Triage Notes (Signed)
Chest congestion for one week.  Sore throat , continue with a cough.

## 2020-06-28 ENCOUNTER — Other Ambulatory Visit: Payer: Self-pay

## 2020-06-28 ENCOUNTER — Ambulatory Visit (HOSPITAL_COMMUNITY)
Admission: EM | Admit: 2020-06-28 | Discharge: 2020-06-28 | Disposition: A | Discharge status: Home / Self Care | Payer: Medicaid Other | Attending: Urgent Care | Admitting: Urgent Care

## 2020-06-28 ENCOUNTER — Encounter (HOSPITAL_COMMUNITY): Payer: Self-pay

## 2020-06-28 ENCOUNTER — Ambulatory Visit (INDEPENDENT_AMBULATORY_CARE_PROVIDER_SITE_OTHER): Payer: Medicaid Other

## 2020-06-28 DIAGNOSIS — J3089 Other allergic rhinitis: Secondary | ICD-10-CM

## 2020-06-28 DIAGNOSIS — R059 Cough, unspecified: Secondary | ICD-10-CM

## 2020-06-28 DIAGNOSIS — J4530 Mild persistent asthma, uncomplicated: Secondary | ICD-10-CM | POA: Diagnosis not present

## 2020-06-28 DIAGNOSIS — R062 Wheezing: Secondary | ICD-10-CM

## 2020-06-28 MED ORDER — FLUTICASONE PROPIONATE 50 MCG/ACT NA SUSP: 2.0000 | Freq: Every day | NASAL | 12 refills | Status: AC

## 2020-06-28 MED ORDER — BENZONATATE 100 MG PO CAPS
100.0000 mg | ORAL_CAPSULE | Freq: Three times a day (TID) | ORAL | 0 refills | Status: DC | PRN
Start: 2020-06-28 — End: 2022-06-08

## 2020-06-28 MED ORDER — CETIRIZINE HCL 10 MG PO TABS: 10.0000 mg | ORAL_TABLET | Freq: Every day | ORAL | 0 refills | Status: AC

## 2020-06-28 MED ORDER — PROMETHAZINE-DM 6.25-15 MG/5ML PO SYRP: 5.0000 mL | ORAL_SOLUTION | Freq: Every evening | ORAL | 0 refills | Status: DC | PRN

## 2020-06-28 MED ORDER — PREDNISONE 20 MG PO TABS: ORAL_TABLET | ORAL | 0 refills | Status: DC

## 2020-06-28 NOTE — ED Triage Notes (Signed)
Pt presents with wheezing and congestion X 7 days

## 2020-06-28 NOTE — ED Provider Notes (Signed)
[name] Pope   MRN: [id number] DOB: 10-Aug-1960  Subjective:   [name] Pope is a 60 y.o. male presenting for 6 to 7-day history of acute onset wheezing, nasal congestion, chest congestion, cough, wheezing.  Denies fever, body aches, chest pain, shortness of breath.  Patient has a history of chronic asthma, is not a smoker.  Has been using an albuterol inhaler with very temporary relief.  Has history of COVID-19, does not want to be checked for this is he has very low suspicion for this.  No current facility-administered medications for this encounter.  Current Outpatient Medications:    atorvastatin (LIPITOR) 40 MG tablet, Take 1 tablet (40 mg total) by mouth daily., Disp: 90 tablet, Rfl: 1   benzonatate (TESSALON) 200 MG capsule, Take 1 capsule (200 mg total) by mouth 3 (three) times daily as needed for cough., Disp: 20 capsule, Rfl: 0   blood glucose meter kit and supplies, Dispense based on patient and insurance preference. Use up to four times daily as directed. (FOR ICD-10 E10.9, E11.9)., Disp: 1 each, Rfl: 0   cholecalciferol (VITAMIN D3) 25 MCG (1000 UNIT) tablet, Take 1,000 Units by mouth daily., Disp: , Rfl:    fluticasone (FLONASE) 50 MCG/ACT nasal spray, Place 2 sprays into both nostrils daily., Disp: 16 mL, Rfl: 0   gabapentin (NEURONTIN) 300 MG capsule, Take 300 mg by mouth at bedtime as needed (leg pain). , Disp: , Rfl:    gabapentin (NEURONTIN) 800 MG tablet, Take 1 tablet (800 mg total) by mouth 3 (three) times daily., Disp: 90 tablet, Rfl: 1   metFORMIN (GLUCOPHAGE) 500 MG tablet, Take 1 tablet (500 mg total) by mouth 2 (two) times daily., Disp: 180 tablet, Rfl: 1   montelukast (SINGULAIR) 10 MG tablet, Take 1 tablet (10 mg total) by mouth at bedtime., Disp: 30 tablet, Rfl: 0   oxyCODONE-acetaminophen (PERCOCET) 10-325 MG tablet, Take 1 tablet by mouth every 4 (four) hours as needed for pain., Disp: , Rfl:    Pseudoephedrine-Ibuprofen (ADVIL COLD & SINUS  LIQUI-GELS PO), Take 2 capsules by mouth every 6 (six) hours as needed (headache)., Disp: , Rfl:    SUMAtriptan (IMITREX) 50 MG tablet, Take 2 tablets (100 mg total) by mouth daily as needed for migraine or headache (do not exceed 200 mg within 24 hours). May repeat in 2 hours if headache persists or recurs., Disp: 20 tablet, Rfl: 1   Allergies  Allergen Reactions   Penicillins     Childhood Has patient had a PCN reaction causing immediate rash, facial/tongue/throat swelling, SOB or lightheadedness with hypotension: n/a Has patient had a PCN reaction causing severe rash involving mucus membranes or skin necrosis: n/a Has patient had a PCN reaction that required hospitalization: n/a Has patient had a PCN reaction occurring within the last 10 years: n/a If all of the above answers are "NO", then may proceed with Cephalosporin use.     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/22/2012   Procedure: RIGHT TOTAL HIP ARTHROPLASTY ANTERIOR APPROACH;  Surgeon: Renette Butters, MD;  Location: [facility];  Service: Orthopedics;  Laterality: Right;   TOTAL HIP ARTHROPLASTY Left 05/12/2013   Procedure: LEFT TOTAL HIP ARTHROPLASTY ANTERIOR APPROACH;  Surgeon: Renette Butters, MD;  Location: [facility];  Service: Orthopedics;  Laterality: Left;    Family History  Problem Relation Age of Onset   Diabetes Mother    Diabetes Father    Hypertension Father     Social History   Tobacco Use   Smoking status: Never   Smokeless tobacco: Never  Vaping Use   Vaping Use: Never used  Substance Use Topics   Alcohol use: Yes    Alcohol/week: 1.0 standard drink    Types: 1 Cans of beer per week    Comment: daily  BEER  LIQUOR  OCC   Drug use: No    ROS   Objective:   Vitals: BP 117/84 (BP Location: Right Arm)   Pulse 87   Temp 98.3 F (36.8 C) (Oral)   Resp 17   SpO2 98%   Physical Exam Constitutional:      General: He is not in acute distress.    Appearance: Normal appearance. He is well-developed. He is not ill-appearing, toxic-appearing or diaphoretic.  HENT:     Head: Normocephalic and atraumatic.     Right Ear: External ear normal.     Left Ear: External ear normal.     Nose: Congestion and rhinorrhea present.     Mouth/Throat:     Mouth: Mucous membranes are moist.     Pharynx: No oropharyngeal exudate or posterior oropharyngeal erythema.  Eyes:     General: No scleral icterus.    Extraocular Movements: Extraocular movements intact.     Pupils: Pupils are equal, round, and reactive to light.  Cardiovascular:     Rate and Rhythm: Normal rate and regular rhythm.     Heart sounds: Normal heart sounds. No murmur heard.   No friction rub. No gallop.  Pulmonary:     Effort: Pulmonary effort is normal. No respiratory distress.     Breath sounds: No stridor. Wheezing present. No rhonchi or rales.  Neurological:     Mental Status: He is alert and oriented to person, place, and time.  Psychiatric:        Mood and Affect: Mood normal.        Behavior: Behavior normal.        Thought Content: Thought content normal.    DG Chest 2 View  Result Date: 06/28/2020 CLINICAL DATA:  Coughing and wheezing for 7 days, history of asthma EXAM: CHEST - 2 VIEW COMPARISON:  12/01/2019 FINDINGS: Normal heart size, mediastinal contours, and pulmonary vascularity. Bronchitic changes, consistent with history of asthma. No acute infiltrate, pleural effusion, or pneumothorax. Osseous structures unremarkable. IMPRESSION: Bronchitic changes consistent with history of asthma. No acute infiltrate. Electronically Signed   By: Lavonia Dana M.D.   On: 06/28/2020 15:47     Assessment and Plan :   PDMP not reviewed this  encounter.  1. Mild persistent chronic asthma without complication   2. Wheezing   3. Cough   4. Allergic rhinitis due to other allergic trigger, unspecified seasonality     Recommended oral prednisone course in light of his asthma, x-ray findings.  Patient's blood sugars have been less than 140s consistently.  He is doing well with his diabetes and therefore safe to use prednisone.  Counseled on appropriate administration of an albuterol puff.  He will start doing this.  Use supportive care otherwise including Zyrtec and Flonase going forward.  Recommended avoiding use of Flonase while on prednisone.  Patient refused COVID-19 test. Counseled patient on potential for adverse effects with medications prescribed/recommended today, ER and return-to-clinic precautions discussed, patient verbalized understanding.    Jaynee Eagles, [place] 06/28/20 1605

## 2021-01-19 ENCOUNTER — Encounter (HOSPITAL_COMMUNITY): Payer: Self-pay

## 2021-01-19 ENCOUNTER — Other Ambulatory Visit: Payer: Self-pay

## 2021-01-19 ENCOUNTER — Ambulatory Visit (HOSPITAL_COMMUNITY)
Admission: EM | Admit: 2021-01-19 | Discharge: 2021-01-19 | Disposition: A | Discharge status: Home / Self Care | Payer: Medicaid Other | Attending: Emergency Medicine | Admitting: Emergency Medicine

## 2021-01-19 DIAGNOSIS — U071 COVID-19: Secondary | ICD-10-CM | POA: Insufficient documentation

## 2021-01-19 DIAGNOSIS — R509 Fever, unspecified: Secondary | ICD-10-CM | POA: Diagnosis present

## 2021-01-19 DIAGNOSIS — J45909 Unspecified asthma, uncomplicated: Secondary | ICD-10-CM | POA: Diagnosis not present

## 2021-01-19 DIAGNOSIS — M1990 Unspecified osteoarthritis, unspecified site: Secondary | ICD-10-CM | POA: Diagnosis not present

## 2021-01-19 DIAGNOSIS — R519 Headache, unspecified: Secondary | ICD-10-CM | POA: Diagnosis not present

## 2021-01-19 DIAGNOSIS — H6122 Impacted cerumen, left ear: Secondary | ICD-10-CM

## 2021-01-19 DIAGNOSIS — E785 Hyperlipidemia, unspecified: Secondary | ICD-10-CM | POA: Insufficient documentation

## 2021-01-19 DIAGNOSIS — E119 Type 2 diabetes mellitus without complications: Secondary | ICD-10-CM | POA: Diagnosis not present

## 2021-01-19 DIAGNOSIS — R0981 Nasal congestion: Secondary | ICD-10-CM | POA: Diagnosis not present

## 2021-01-19 DIAGNOSIS — E669 Obesity, unspecified: Secondary | ICD-10-CM | POA: Diagnosis not present

## 2021-01-19 DIAGNOSIS — J069 Acute upper respiratory infection, unspecified: Secondary | ICD-10-CM | POA: Diagnosis not present

## 2021-01-19 DIAGNOSIS — M542 Cervicalgia: Secondary | ICD-10-CM | POA: Diagnosis not present

## 2021-01-19 DIAGNOSIS — R0982 Postnasal drip: Secondary | ICD-10-CM | POA: Insufficient documentation

## 2021-01-19 MED ORDER — LIDOCAINE VISCOUS HCL 2 % MT SOLN: 15.0000 mL | OROMUCOSAL | 0 refills | Status: AC | PRN

## 2021-01-19 MED ORDER — IBUPROFEN 800 MG PO TABS: 800.0000 mg | ORAL_TABLET | Freq: Three times a day (TID) | ORAL | 0 refills | Status: AC

## 2021-01-19 NOTE — ED Triage Notes (Signed)
Pt is requesting covid testing today.

## 2021-01-19 NOTE — ED Triage Notes (Signed)
Pt presents to the office for cough,body aches and fever x 2 days.

## 2021-01-19 NOTE — ED Provider Notes (Signed)
[facility]    CSN: [id number] Arrival date & time: 01/19/21  1044      History   Chief Complaint Chief Complaint  Patient presents with   Cough    BODY ACHES AND NECK PAIN X 2 DAYS.     HPI [name] Pope is a 61 y.o. male.   Patient presents with chills, fever, nasal congestion, postnasal drip, rhinorrhea, nonproductive cough and intermittent generalized headaches for 2 days. Tolerating food and liquids. No known sick contacts. Has attempted treatment of symptoms. History of  Arthritis, asthma, BPH, hyperlipidemia, type 2 diabetes, osteoporosis, obesity.  Past Medical History:  Diagnosis Date   Allergy    Arthritis    Asthma    BPH with urinary obstruction    COVID-19 virus infection 11/2019   History of appendicitis    Lumbar radiculopathy    Obesity    Osteoporosis     Patient Active Problem List   Diagnosis Date Noted   Obesity    COVID-19 virus infection 11/2019   HLD (hyperlipidemia) 04/17/2018   Type 2 diabetes mellitus ([place]) 04/17/2018   Asthma, chronic 04/17/2018   Alcohol intoxication, uncomplicated ([place]) 83/15/1761   Benign prostatic hyperplasia with urinary obstruction 05/12/2015   DJD (degenerative joint disease) 05/12/2013   Hip pain, chronic 11/19/2011    Past Surgical History:  Procedure Laterality Date   APPENDECTOMY     TOTAL HIP ARTHROPLASTY Right 10/22/2012   Procedure: RIGHT TOTAL HIP ARTHROPLASTY ANTERIOR APPROACH;  Surgeon: Renette Butters, MD;  Location: [facility];  Service: Orthopedics;  Laterality: Right;   TOTAL HIP ARTHROPLASTY Left 05/12/2013   Procedure: LEFT TOTAL HIP ARTHROPLASTY ANTERIOR APPROACH;  Surgeon: Renette Butters, MD;  Location: [facility];  Service: Orthopedics;  Laterality: Left;       Home Medications    Prior to Admission medications   Medication Sig Start Date End Date Taking? Authorizing Provider  atorvastatin (LIPITOR) 40 MG tablet Take 1 tablet (40 mg total) by mouth daily. 12/16/17   Scot Jun, FNP  benzonatate (TESSALON) 100 MG capsule Take 1-2 capsules (100-200 mg total) by mouth 3 (three) times daily as needed. 06/28/20   Jaynee Eagles, PA-C  blood glucose meter kit and supplies Dispense based on patient and insurance preference. Use up to four times daily as directed. (FOR ICD-10 E10.9, E11.9). 01/13/18   Scot Jun, FNP  cetirizine (ZYRTEC ALLERGY) 10 MG tablet Take 1 tablet (10 mg total) by mouth daily. 06/28/20   Jaynee Eagles, PA-C  cholecalciferol (VITAMIN D3) 25 MCG (1000 UNIT) tablet Take 1,000 Units by mouth daily.    [provider]  fluticasone (FLONASE) 50 MCG/ACT nasal spray Place 2 sprays into both nostrils daily. 06/28/20   Jaynee Eagles, PA-C  gabapentin (NEURONTIN) 300 MG capsule Take 300 mg by mouth at bedtime as needed (leg pain).  04/09/19   [provider]  gabapentin (NEURONTIN) 800 MG tablet Take 1 tablet (800 mg total) by mouth 3 (three) times daily. 12/16/17   Scot Jun, FNP  metFORMIN (GLUCOPHAGE) 500 MG tablet Take 1 tablet (500 mg total) by mouth 2 (two) times daily. 12/16/17   Scot Jun, FNP  montelukast (SINGULAIR) 10 MG tablet Take 1 tablet (10 mg total) by mouth at bedtime. 03/26/20   Hughie Closs, PA-C  oxyCODONE-acetaminophen (PERCOCET) 10-325 MG tablet Take 1 tablet by mouth every 4 (four) hours as needed for pain.    [provider]  predniSONE (DELTASONE) 20  MG tablet Take 2 tablets daily with breakfast. 06/28/20   Jaynee Eagles, PA-C  promethazine-dextromethorphan (PROMETHAZINE-DM) 6.25-15 MG/5ML syrup Take 5 mLs by mouth at bedtime as needed for cough. 06/28/20   Jaynee Eagles, PA-C  Pseudoephedrine-Ibuprofen (ADVIL COLD & SINUS LIQUI-GELS PO) Take 2 capsules by mouth every 6 (six) hours as needed (headache).    [provider]  SUMAtriptan (IMITREX) 50 MG tablet Take 2 tablets (100 mg total) by mouth daily as needed for migraine or headache (do not exceed 200 mg within 24 hours). May repeat in  2 hours if headache persists or recurs. 01/13/18   Scot Jun, FNP    Family History Family History  Problem Relation Age of Onset   Diabetes Mother    Diabetes Father    Hypertension Father     Social History Social History   Tobacco Use   Smoking status: Never   Smokeless tobacco: Never  Vaping Use   Vaping Use: Never used  Substance Use Topics   Alcohol use: Yes    Alcohol/week: 1.0 standard drink    Types: 1 Cans of beer per week    Comment: daily  BEER  LIQUOR OCC   Drug use: No     Allergies   Penicillins   Review of Systems Review of Systems  Constitutional:  Positive for chills and fever. Negative for activity change, appetite change, diaphoresis, fatigue and unexpected weight change.  HENT:  Positive for congestion, postnasal drip and rhinorrhea. Negative for dental problem, drooling, ear discharge, facial swelling, hearing loss, mouth sores, nosebleeds, sinus pressure, sinus pain, sneezing, sore throat, tinnitus, trouble swallowing and voice change.   Respiratory:  Positive for cough.   Cardiovascular: Negative.   Gastrointestinal: Negative.   Musculoskeletal:  Positive for joint swelling and myalgias. Negative for arthralgias, back pain, gait problem, neck pain and neck stiffness.  Neurological:  Positive for headaches. Negative for dizziness, tremors, seizures, syncope, facial asymmetry, speech difficulty, weakness, light-headedness and numbness.    Physical Exam Triage Vital Signs ED Triage Vitals  Enc Vitals Group     BP      Pulse      Resp      Temp      Temp src      SpO2      Weight      Height      Head Circumference      Peak Flow      Pain Score      Pain Loc      Pain Edu?      Excl. in [place]?    No data found.  Updated Vital Signs There were no vitals taken for this visit.  Visual Acuity Right Eye Distance:   Left Eye Distance:   Bilateral Distance:    Right Eye Near:   Left Eye Near:    Bilateral Near:     Physical  Exam Constitutional:      Appearance: Normal appearance.  HENT:     Head: Normocephalic.     Right Ear: Tympanic membrane, ear canal and external ear normal.     Left Ear: Ear canal and external ear normal. There is impacted cerumen.     Nose: Congestion present. No rhinorrhea.     Mouth/Throat:     Pharynx: Oropharynx is clear.     Tonsils: No tonsillar exudate. 0 on the right. 0 on the left.  Eyes:     Extraocular Movements: Extraocular movements intact.  Cardiovascular:  Rate and Rhythm: Normal rate and regular rhythm.     Pulses: Normal pulses.     Heart sounds: Normal heart sounds.  Pulmonary:     Effort: Pulmonary effort is normal.     Breath sounds: Normal breath sounds.  Musculoskeletal:     Cervical back: Normal range of motion.  Lymphadenopathy:     Cervical: Cervical adenopathy present.  Skin:    General: Skin is warm and dry.  Neurological:     Mental Status: He is alert and oriented to person, place, and time. Mental status is at baseline.  Psychiatric:        Mood and Affect: Mood normal.        Behavior: Behavior normal.     UC Treatments / Results  Labs (all labs ordered are listed, but only abnormal results are displayed) Labs Reviewed  SARS CORONAVIRUS 2 (TAT 6-24 HRS)    EKG   Radiology No results found.  Procedures Procedures (including critical care time)  Medications Ordered in UC Medications - No data to display  Initial Impression / Assessment and Plan / UC Course  I have reviewed the triage vital signs and the nursing notes.  Pertinent labs & imaging results that were available during my care of the patient were reviewed by me and considered in my medical decision making (see chart for details).  Viral URI  Vital signs stable, patient in no signs of distress, stable for outpatient treatment, COVID test pending, due to medical history qualifies for antiviral use, discussed with patient, will prescribe if positive, etiology of  symptoms most likely viral, discussed etiology, timeline and possible resolution with patient, ear irrigation of left ear, unsuccessful, advised patient to use over-the-counter Debrox drops with follow-up with ENT specialist if unsuccessful, prescribed ibuprofen and lidocaine viscous as sore throat is most worrisome symptom today, may follow-up with urgent care Final Clinical Impressions(s) / UC Diagnoses   Final diagnoses:  None   Discharge Instructions   None    ED Prescriptions   None    PDMP not reviewed this encounter.   Hans Eden, NP 01/19/21 1250

## 2021-01-19 NOTE — Discharge Instructions (Addendum)
We will contact you if your COVID test is positive.  Please quarantine while you wait for the results.  If your test is negative you may resume normal activities.  If your test is positive please continue to quarantine for at least 5 days from your symptom onset or until you are without a fever for at least 24 hours after the medications. Your are eligible for antivirals if positive.   Please use over-the-counter Debrox drops, following directions on package to further cleans ears, if unsuccessful you may follow-up with ear nose and throat specialist for further evaluation    You can take Tylenol and/or Ibuprofen as needed for fever reduction and pain relief.   For cough: honey 1/2 to 1 teaspoon (you can dilute the honey in water or another fluid).  You can also use guaifenesin and dextromethorphan for cough. You can use a humidifier for chest congestion and cough.  If you don't have a humidifier, you can sit in the bathroom with the hot shower running.      For sore throat: try warm salt water gargles, cepacol lozenges, throat spray, warm tea or water with lemon/honey, popsicles or ice, or OTC cold relief medicine for throat discomfort.   For congestion: take a daily anti-histamine like Zyrtec, Claritin, and a oral decongestant, such as pseudoephedrine.  You can also use Flonase 1-2 sprays in each nostril daily.   It is important to stay hydrated: drink plenty of fluids (water, gatorade/powerade/pedialyte, juices, or teas) to keep your throat moisturized and help further relieve irritation/discomfort.

## 2021-01-20 ENCOUNTER — Telehealth (HOSPITAL_COMMUNITY): Payer: Self-pay | Admitting: Internal Medicine

## 2021-01-20 LAB — SARS CORONAVIRUS 2 (TAT 6-24 HRS): SARS Coronavirus 2: POSITIVE — AB

## 2021-01-20 MED ORDER — MOLNUPIRAVIR EUA 200MG CAPSULE: 4.0000 | ORAL_CAPSULE | Freq: Two times a day (BID) | ORAL | 0 refills | Status: AC

## 2021-01-20 NOTE — Telephone Encounter (Signed)
Molnupiravir was sent to the pharmacy on file. Sent on 01/20/21. I called patient's phone number on file but patient didn't pick up. No messages left.

## 2022-04-18 ENCOUNTER — Ambulatory Visit (INDEPENDENT_AMBULATORY_CARE_PROVIDER_SITE_OTHER): Payer: Medicaid Other

## 2022-04-18 ENCOUNTER — Encounter (HOSPITAL_COMMUNITY): Payer: Self-pay

## 2022-04-18 ENCOUNTER — Ambulatory Visit (HOSPITAL_COMMUNITY)
Admission: EM | Admit: 2022-04-18 | Discharge: 2022-04-18 | Disposition: A | Discharge status: Home / Self Care | Payer: Medicaid Other | Attending: Internal Medicine | Admitting: Internal Medicine

## 2022-04-18 DIAGNOSIS — M545 Low back pain, unspecified: Secondary | ICD-10-CM

## 2022-04-18 MED ORDER — KETOROLAC TROMETHAMINE 60 MG/2ML IM SOLN
INTRAMUSCULAR | Status: AC
  Filled 2022-04-18: qty 2

## 2022-04-18 MED ORDER — MELOXICAM 15 MG PO TABS: 15.0000 mg | ORAL_TABLET | Freq: Every day | ORAL | 0 refills | Status: AC

## 2022-04-18 MED ORDER — METHYLPREDNISOLONE ACETATE 80 MG/ML IJ SUSP
80.0000 mg | Freq: Once | INTRAMUSCULAR | Status: AC
  Administered 2022-04-18: 80 mg via INTRAMUSCULAR

## 2022-04-18 MED ORDER — KETOROLAC TROMETHAMINE 30 MG/ML IJ SOLN: 30.0000 mg | Freq: Once | INTRAMUSCULAR | Status: DC

## 2022-04-18 MED ORDER — METHYLPREDNISOLONE ACETATE 80 MG/ML IJ SUSP
INTRAMUSCULAR | Status: AC
  Filled 2022-04-18: qty 1

## 2022-04-18 MED ORDER — KETOROLAC TROMETHAMINE 60 MG/2ML IM SOLN
30.0000 mg | Freq: Once | INTRAMUSCULAR | Status: AC
  Administered 2022-04-18: 30 mg via INTRAMUSCULAR

## 2022-04-18 MED ORDER — METHYLPREDNISOLONE ACETATE 40 MG/ML IJ SUSP: 80.0000 mg | Freq: Once | INTRAMUSCULAR | Status: DC

## 2022-04-18 NOTE — ED Triage Notes (Signed)
Low back pain for 5 days. Has history of osteoarthritis. Has been using an arthritis pain cream with no relief. No urinary symptoms. No known falls or injuries.

## 2022-04-18 NOTE — ED Provider Notes (Signed)
[facility]   [id number] 04/18/22 Arrival Time: 1014  ASSESSMENT & PLAN:  X-rays show grade 1 early listhesis of L4 and L5, L4-L5 and L5-S1 facet joint sclerosis and hypertrophy.  Preserved spaces.  No acute bony abnormality.  1. Acute midline low back pain without sciatica    -Discussed with patient is back pain is due to degenerative changes at the L4-L5 level.  Recommended multimodal pain control with NSAIDs, heat/ice, stretches and core strengthening.  Recommended weight loss as well, he is on Ozempic.  For symptomatic relief today, will give him intramuscular injections of Toradol and Depo-Medrol.  For longitudinal relief I will send in a prescription for meloxicam to take once daily with food.  Advised not to take ibuprofen with this.  To follow-up with his pain medicine doctor as needed.  All questions answered and he agrees with the plan.  Meds ordered this encounter  Medications   meloxicam (MOBIC) 15 MG tablet    Sig: Take 1 tablet (15 mg total) by mouth daily.    Dispense:  30 tablet    Refill:  0   ketorolac (TORADOL) 30 MG/ML injection 30 mg   methylPREDNISolone acetate (DEPO-MEDROL) injection 80 mg     Discharge Instructions      You have arthritis in your back Take meloxicam 1x per day with food Do not take ibuprofen with meloxicam. Tylenol is OK. Use heat/ice/stretches to hekp this feel better too  It was nice to meet you today!        Reviewed expectations re: course of current medical issues. Questions answered. Outlined signs and symptoms indicating need for more acute intervention. Patient verbalized understanding. After Visit Summary given.   SUBJECTIVE: Pleasant 62 year old male with past medical history of bilateral hip replacements comes urgent care to be evaluated for low back pain.  Symptoms started 5 days ago.  No inciting injury or trauma.  Has been using arthritis pain cream and ibuprofen with no relief.  No fevers, numbness in  groin, loss of bowel or bladder control.  He does follow with a primary care doctor and pain medicine doctor who has him on gabapentin.  No LMP for male patient. Past Surgical History:  Procedure Laterality Date   APPENDECTOMY     TOTAL HIP ARTHROPLASTY Right 10/22/2012   Procedure: RIGHT TOTAL HIP ARTHROPLASTY ANTERIOR APPROACH;  Surgeon: Sheral Apley, MD;  Location: [facility] OR;  Service: Orthopedics;  Laterality: Right;   TOTAL HIP ARTHROPLASTY Left 05/12/2013   Procedure: LEFT TOTAL HIP ARTHROPLASTY ANTERIOR APPROACH;  Surgeon: Sheral Apley, MD;  Location: [facility] OR;  Service: Orthopedics;  Laterality: Left;     OBJECTIVE:  Vitals:   04/18/22 1201  BP: 118/81  Pulse: 82  Resp: 18  Temp: 97.7 F (36.5 C)  TempSrc: Oral  SpO2: 96%  Weight: 98.9 kg  Height: 5\' 9"  (1.753 m)     Physical Exam Vitals and nursing note reviewed.  Constitutional:      General: He is not in acute distress.    Appearance: Normal appearance.  Cardiovascular:     Rate and Rhythm: Normal rate.  Pulmonary:     Effort: Pulmonary effort is normal.  Musculoskeletal:     Comments: Lumbar spine -no obvious deformity.  Tender to palpation at midline at L4-L5.  Nontender to palpation at paraspinal musculature.  No flank pain tenderness.  He is able to perform lumbar flexion extension, twisting and side bending but does have pain with these movements.  5/5  strength in hip flexion and knee extension bilaterally.  Negative straight leg raise bilaterally.  Negative slump test bilaterally.      Labs: Results for orders placed or performed during the hospital encounter of 01/19/21  SARS CORONAVIRUS 2 (TAT 6-24 HRS) Nasopharyngeal Nasopharyngeal Swab   Specimen: Nasopharyngeal Swab  Result Value Ref Range   SARS Coronavirus 2 POSITIVE (A) NEGATIVE   Labs Reviewed - No data to display  Imaging: DG Lumbar Spine Complete  Result Date: 04/18/2022 CLINICAL DATA:  Lower back pain for 5 days. EXAM: LUMBAR SPINE  - COMPLETE 4+ VIEW COMPARISON:  Lumbar spine radiographs 01/06/2018 FINDINGS: There are 5 non-rib-bearing lumbar-type vertebral bodies. Minimal levocurvature centered at L4. There is 3 mm grade 1 anterolisthesis of L4 on L5, appearing new from prior. Vertebral body heights and disc spaces are maintained. Mild anterior L3-4 and L4-5 endplate spurs. L4-5 and L5-S1 facet joint sclerosis and hypertrophy. Partial visualization of bilateral total hip arthroplasty. IMPRESSION: Compared to 01/06/2018: 1. New grade 1 anterolisthesis of L4 on L5. 2. L4-5 and L5-S1 facet joint sclerosis and hypertrophy. 3. Preserved disc spaces. Electronically Signed   By: Neita Garnet M.D.   On: 04/18/2022 12:42     Allergies  Allergen Reactions   Penicillins     Childhood Has patient had a PCN reaction causing immediate rash, facial/tongue/throat swelling, SOB or lightheadedness with hypotension: n/a Has patient had a PCN reaction causing severe rash involving mucus membranes or skin necrosis: n/a Has patient had a PCN reaction that required hospitalization: n/a Has patient had a PCN reaction occurring within the last 10 years: n/a If all of the above answers are "NO", then may proceed with Cephalosporin use.                                                Past Medical History:  Diagnosis Date   Allergy    Arthritis    Asthma    BPH with urinary obstruction    COVID-19 virus infection 11/2019   History of appendicitis    Lumbar radiculopathy    Obesity    Osteoporosis     Social History   Socioeconomic History   Marital status: Single    Spouse name: Not on file   Number of children: 5   Years of education: Not on file   Highest education level: Not on file  Occupational History   Occupation: disabled  Tobacco Use   Smoking status: Never   Smokeless tobacco: Never  Vaping Use   Vaping Use: Never used  Substance and Sexual Activity   Alcohol use: Yes    Alcohol/week: 1.0 standard drink of alcohol     Types: 1 Cans of beer per week    Comment: daily  BEER  LIQUOR OCC   Drug use: No   Sexual activity: Not on file  Other Topics Concern   Not on file  Social History Narrative   Not on file   Social Determinants of Health   Financial Resource Strain: Not on file  Food Insecurity: Not on file  Transportation Needs: Not on file  Physical Activity: Not on file  Stress: Not on file  Social Connections: Not on file  Intimate Partner Violence: Not on file    Family History  Problem Relation Age of Onset   Diabetes Mother    Diabetes  Father    Hypertension Father       Rifky Lapre, Baldemar FridayHunter W, MD 04/18/22 1304

## 2022-04-18 NOTE — Discharge Instructions (Addendum)
You have arthritis in your back Take meloxicam 1x per day with food Do not take ibuprofen with meloxicam. Tylenol is OK. Use heat/ice/stretches to hekp this feel better too  It was nice to meet you today!

## 2022-06-08 ENCOUNTER — Ambulatory Visit (HOSPITAL_COMMUNITY)
Admission: EM | Admit: 2022-06-08 | Discharge: 2022-06-08 | Disposition: A | Discharge status: Home / Self Care | Payer: Medicaid Other

## 2022-06-08 ENCOUNTER — Encounter (HOSPITAL_COMMUNITY): Payer: Self-pay | Admitting: Emergency Medicine

## 2022-06-08 DIAGNOSIS — R21 Rash and other nonspecific skin eruption: Secondary | ICD-10-CM | POA: Diagnosis not present

## 2022-06-08 DIAGNOSIS — B009 Herpesviral infection, unspecified: Secondary | ICD-10-CM | POA: Diagnosis not present

## 2022-06-08 MED ORDER — VALACYCLOVIR HCL 1 G PO TABS: 1000.0000 mg | ORAL_TABLET | Freq: Every day | ORAL | 0 refills | Status: AC

## 2022-06-08 NOTE — ED Provider Notes (Signed)
[facility]    CSN: [id number] Arrival date & time: 06/08/22  1428     History   Chief Complaint Chief Complaint  Patient presents with   Rash    HPI [name] Pope is a 62 y.o. male.  Here with 6 day history of rash on the scrotum. It started on the right side then moved to the left. Pain started 3-4 days ago, worse with sitting He had tried hydrocortisone cream for 2 days before seeing a provider who then prescribed mupirocin ointment. 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   Past Medical History:  Diagnosis Date   Allergy    Arthritis    Asthma    BPH with urinary obstruction    COVID-19 virus infection 11/2019   History of appendicitis    Lumbar radiculopathy    Obesity    Osteoporosis     Patient Active Problem List   Diagnosis Date Noted   Obesity    COVID-19 virus infection 11/2019   HLD (hyperlipidemia) 04/17/2018   Type 2 diabetes mellitus (HCC) 04/17/2018   Asthma, chronic 04/17/2018   Alcohol intoxication, uncomplicated (HCC) 12/16/2017   Benign prostatic hyperplasia with urinary obstruction 05/12/2015   DJD (degenerative joint disease) 05/12/2013   Hip pain, chronic 11/19/2011    Past Surgical History:  Procedure Laterality Date   APPENDECTOMY     TOTAL HIP ARTHROPLASTY Right 10/22/2012   Procedure: RIGHT TOTAL HIP ARTHROPLASTY ANTERIOR APPROACH;  Surgeon: Sheral Apley, MD;  Location: [facility];  Service: Orthopedics;  Laterality: Right;   TOTAL HIP ARTHROPLASTY Left 05/12/2013   Procedure: LEFT TOTAL HIP ARTHROPLASTY ANTERIOR APPROACH;  Surgeon: Sheral Apley, MD;  Location: [facility];  Service: Orthopedics;  Laterality: Left;       Home Medications    Prior to Admission medications   Medication Sig Start Date End Date Taking? Authorizing Provider  mupirocin ointment (BACTROBAN) 2 % Apply 1 Application topically 2  (two) times daily. 06/05/22  Yes [provider]  valACYclovir (VALTREX) 1000 MG tablet Take 1 tablet (1,000 mg total) by mouth daily for 5 days. 06/08/22 06/13/22 Yes Elohim Brune, Lurena Joiner, PA-C  atorvastatin (LIPITOR) 40 MG tablet Take 1 tablet (40 mg total) by mouth daily. 12/16/17   Bing Neighbors, NP  blood glucose meter kit and supplies Dispense based on patient and insurance preference. Use up to four times daily as directed. (FOR ICD-10 E10.9, E11.9). 01/13/18   Bing Neighbors, NP  cetirizine (ZYRTEC ALLERGY) 10 MG tablet Take 1 tablet (10 mg total) by mouth daily. 06/28/20   Wallis Bamberg, PA-C  cholecalciferol (VITAMIN D3) 25 MCG (1000 UNIT) tablet Take 1,000 Units by mouth daily.    [provider]  fluticasone (FLONASE) 50 MCG/ACT nasal spray Place 2 sprays into both nostrils daily. 06/28/20   Wallis Bamberg, PA-C  gabapentin (NEURONTIN) 300 MG capsule Take 300 mg by mouth at bedtime as needed (leg pain).  04/09/19   [provider]  gabapentin (NEURONTIN) 800 MG tablet Take 1 tablet (800 mg total) by mouth 3 (three) times daily. 12/16/17   Bing Neighbors, NP  ibuprofen (ADVIL) 800 MG tablet Take 1 tablet (800 mg total) by mouth 3 (three) times daily. 01/19/21   White, Elita Boone, NP  lidocaine (XYLOCAINE) 2 % solution Use as directed 15 mLs in the mouth or throat as needed for  mouth pain. 01/19/21   White, Elita Boone, NP  metFORMIN (GLUCOPHAGE) 500 MG tablet Take 1 tablet (500 mg total) by mouth 2 (two) times daily. 12/16/17   Bing Neighbors, NP  montelukast (SINGULAIR) 10 MG tablet Take 1 tablet (10 mg total) by mouth at bedtime. 03/26/20   Rushie Chestnut, PA-C  predniSONE (DELTASONE) 20 MG tablet Take 2 tablets daily with breakfast. 06/28/20   Wallis Bamberg, PA-C    Family History Family History  Problem Relation Age of Onset   Diabetes Mother    Diabetes Father    Hypertension Father     Social History Social History   Tobacco Use   Smoking status: Never    Smokeless tobacco: Never  Vaping Use   Vaping Use: Never used  Substance Use Topics   Alcohol use: Yes    Alcohol/week: 1.0 standard drink of alcohol    Types: 1 Cans of beer per week    Comment: daily  BEER  LIQUOR OCC   Drug use: No     Allergies   Penicillins   Review of Systems Review of Systems As per HPI  Physical Exam Triage Vital Signs ED Triage Vitals  Enc Vitals Group     BP 06/08/22 1448 (!) 131/90     Pulse Rate 06/08/22 1448 95     Resp 06/08/22 1448 18     Temp 06/08/22 1448 98.1 F (36.7 C)     Temp Source 06/08/22 1448 Oral     SpO2 06/08/22 1448 96 %     Weight --      Height --      Head Circumference --      Peak Flow --      Pain Score 06/08/22 1447 10     Pain Loc --      Pain Edu? --      Excl. in GC? --    No data found.  Updated Vital Signs BP (!) 131/90 (BP Location: Right Arm)   Pulse 95   Temp 98.1 F (36.7 C) (Oral)   Resp 18   SpO2 96%   Physical Exam Vitals and nursing note reviewed. Exam conducted with a chaperone present (Arianna CMA).  Constitutional:      General: He is not in acute distress.    Appearance: He is not ill-appearing.  HENT:     Mouth/Throat:     Pharynx: Oropharynx is clear.  Cardiovascular:     Rate and Rhythm: Normal rate and regular rhythm.  Pulmonary:     Effort: Pulmonary effort is normal.  Genitourinary:    Testes:        Right: Mass, swelling or testicular hydrocele not present.        Left: Mass, swelling or testicular hydrocele not present.     Epididymis:     Right: Normal. No tenderness.     Left: Normal. No tenderness.     Comments: There are several small circular areas on the left scrotum. They are tender. No open lesions or drainage. Do not appear to be open ulcers  Skin:    Findings: Rash present.  Neurological:     Mental Status: He is alert and oriented to person, place, and time.      UC Treatments / Results  Labs (all labs ordered are listed, but only abnormal  results are displayed) Labs Reviewed - No data to display  EKG  Radiology No results found.  Procedures Procedures (including critical care time)  Medications Ordered in UC Medications - No data to display  Initial Impression / Assessment and Plan / UC Course  I have reviewed the triage vital signs and the nursing notes.  Pertinent labs & imaging results that were available during my care of the patient were reviewed by me and considered in my medical decision making (see chart for details).  Unknown etiology of rash. The distribution could be HSV. There are not currently any open lesions available to be unroofed and swabbed. The areas appear to be healing. However with patient pain and discomfort, history of HSV, will treat with valtrex 1g daily x 5 days.  Discussed if this medication helps with rash and pain this was likely HSV.  If he does not notice any improvement after finishing the Valtrex he needs to follow-up with his primary care provider.  Discussed reasons to be seen in the emergency department.  No questions at this time.  Final Clinical Impressions(s) / UC Diagnoses   Final diagnoses:  Scrotal rash  Herpes simplex     Discharge Instructions      Take the valtrex once daily for the next 5 days If the rash is herpes virus, this should help. If rash or pain does not improve after the medicine, it might not be herpes. You will need to follow up with your primary care provider for further evaluation.    ED Prescriptions     Medication Sig Dispense Auth. Provider   valACYclovir (VALTREX) 1000 MG tablet Take 1 tablet (1,000 mg total) by mouth daily for 5 days. 5 tablet Carlicia Leavens, Lurena Joiner, PA-C      PDMP not reviewed this encounter.   Marlow Baars, [place] 06/08/22 1540

## 2022-06-08 NOTE — Discharge Instructions (Addendum)
Take the valtrex once daily for the next 5 days If the rash is herpes virus, this should help. If rash or pain does not improve after the medicine, it might not be herpes. You will need to follow up with your primary care provider for further evaluation.

## 2022-06-08 NOTE — ED Triage Notes (Signed)
Pt c/o swelling in testicles for 6 days. Was using hydrocortisone cream then was sent in a different cream but not helping.

## 2023-01-12 ENCOUNTER — Ambulatory Visit (HOSPITAL_COMMUNITY)
Admission: EM | Admit: 2023-01-12 | Discharge: 2023-01-12 | Disposition: A | Discharge status: Home / Self Care | Payer: Medicaid Other

## 2023-01-12 ENCOUNTER — Encounter (HOSPITAL_COMMUNITY): Payer: Self-pay | Admitting: Emergency Medicine

## 2023-01-12 DIAGNOSIS — H6122 Impacted cerumen, left ear: Secondary | ICD-10-CM

## 2023-01-12 NOTE — Discharge Instructions (Addendum)
 Can you Debrox in the future if needed, this can be purchasd over the counter. Avoid qtips in the future

## 2023-01-12 NOTE — ED Triage Notes (Signed)
 Pt reports used Q-tip in left ear and caused pain for 6 days. Reports "hear ocean in my ear". Reports right ear is fine.

## 2023-01-12 NOTE — ED Provider Notes (Signed)
 [facility]    CSN: [id number] Arrival date & time: 01/12/23  1006      History   Chief Complaint Chief Complaint  Patient presents with   Otalgia    HPI DEMPSY DAMIANO is a 63 y.o. male.   Patient presents with left ear discomfort.  He reports he was feeling like the ear was clogged and tried to use a Q-tip to get the wax out which made his ear feel worse.  He reports he has had cerumen impaction previously with successful ear lavage and improvement of pain.  He denies any other symptoms at this time.    Past Medical History:  Diagnosis Date   Allergy    Arthritis    Asthma    BPH with urinary obstruction    COVID-19 virus infection 11/2019   History of appendicitis    Lumbar radiculopathy    Obesity    Osteoporosis     Patient Active Problem List   Diagnosis Date Noted   Obesity    COVID-19 virus infection 11/2019   HLD (hyperlipidemia) 04/17/2018   Type 2 diabetes mellitus (HCC) 04/17/2018   Asthma, chronic 04/17/2018   Alcohol intoxication, uncomplicated (HCC) 12/16/2017   Benign prostatic hyperplasia with urinary obstruction 05/12/2015   DJD (degenerative joint disease) 05/12/2013   Hip pain, chronic 11/19/2011    Past Surgical History:  Procedure Laterality Date   APPENDECTOMY     TOTAL HIP ARTHROPLASTY Right 10/22/2012   Procedure: RIGHT TOTAL HIP ARTHROPLASTY ANTERIOR APPROACH;  Surgeon: Evalene JONETTA Chancy, MD;  Location: [facility];  Service: Orthopedics;  Laterality: Right;   TOTAL HIP ARTHROPLASTY Left 05/12/2013   Procedure: LEFT TOTAL HIP ARTHROPLASTY ANTERIOR APPROACH;  Surgeon: Evalene JONETTA Chancy, MD;  Location: [facility];  Service: Orthopedics;  Laterality: Left;       Home Medications    Prior to Admission medications   Medication Sig Start Date End Date Taking? Authorizing Provider  oxyCODONE -acetaminophen  (PERCOCET) 10-325 MG tablet Take 1 tablet by mouth 4 (four) times daily as needed. 12/28/22  Yes [provider]   valACYclovir  (VALTREX ) 500 MG tablet Take 500 mg by mouth daily. 12/28/22  Yes [provider]  atorvastatin  (LIPITOR) 40 MG tablet Take 1 tablet (40 mg total) by mouth daily. 12/16/17   Arloa Suzen RAMAN, NP  blood glucose meter kit and supplies Dispense based on patient and insurance preference. Use up to four times daily as directed. (FOR ICD-10 E10.9, E11.9). 01/13/18   Arloa Suzen RAMAN, NP  cetirizine  (ZYRTEC  ALLERGY) 10 MG tablet Take 1 tablet (10 mg total) by mouth daily. 06/28/20   Christopher Savannah, PA-C  cholecalciferol (VITAMIN D3) 25 MCG (1000 UNIT) tablet Take 1,000 Units by mouth daily.    [provider]  fluticasone  (FLONASE ) 50 MCG/ACT nasal spray Place 2 sprays into both nostrils daily. 06/28/20   Christopher Savannah, PA-C  gabapentin  (NEURONTIN ) 300 MG capsule Take 300 mg by mouth at bedtime as needed (leg pain).  04/09/19   [provider]  gabapentin  (NEURONTIN ) 800 MG tablet Take 1 tablet (800 mg total) by mouth 3 (three) times daily. 12/16/17   Arloa Suzen RAMAN, NP  ibuprofen  (ADVIL ) 800 MG tablet Take 1 tablet (800 mg total) by mouth 3 (three) times daily. 01/19/21   White, Shelba SAUNDERS, NP  lidocaine  (XYLOCAINE ) 2 % solution Use as directed 15 mLs in the mouth or throat as needed for mouth pain. 01/19/21   Teresa Shelba SAUNDERS, NP  metFORMIN  (GLUCOPHAGE )  500 MG tablet Take 1 tablet (500 mg total) by mouth 2 (two) times daily. 12/16/17   Arloa Suzen RAMAN, NP  montelukast  (SINGULAIR ) 10 MG tablet Take 1 tablet (10 mg total) by mouth at bedtime. 03/26/20   Tonette Lauraine HERO, PA-C  mupirocin ointment (BACTROBAN) 2 % Apply 1 Application topically 2 (two) times daily. 06/05/22   [provider]  predniSONE  (DELTASONE ) 20 MG tablet Take 2 tablets daily with breakfast. 06/28/20   Christopher Savannah, PA-C    Family History Family History  Problem Relation Age of Onset   Diabetes Mother    Diabetes Father    Hypertension Father     Social History Social History    Tobacco Use   Smoking status: Never   Smokeless tobacco: Never  Vaping Use   Vaping status: Never Used  Substance Use Topics   Alcohol use: Yes    Alcohol/week: 1.0 standard drink of alcohol    Types: 1 Cans of beer per week    Comment: daily  BEER  LIQUOR OCC   Drug use: No     Allergies   Penicillins   Review of Systems Review of Systems  Constitutional:  Negative for chills and fever.  HENT:  Positive for ear pain. Negative for sore throat.   Eyes:  Negative for pain and visual disturbance.  Respiratory:  Negative for cough and shortness of breath.   Cardiovascular:  Negative for chest pain and palpitations.  Gastrointestinal:  Negative for abdominal pain and vomiting.  Genitourinary:  Negative for dysuria and hematuria.  Musculoskeletal:  Negative for arthralgias and back pain.  Skin:  Negative for color change and rash.  Neurological:  Negative for seizures and syncope.  All other systems reviewed and are negative.    Physical Exam Triage Vital Signs ED Triage Vitals  Encounter Vitals Group     BP 01/12/23 1046 (!) 148/85     Systolic BP Percentile --      Diastolic BP Percentile --      Pulse Rate 01/12/23 1046 80     Resp 01/12/23 1046 18     Temp 01/12/23 1046 97.7 F (36.5 C)     Temp Source 01/12/23 1046 Oral     SpO2 01/12/23 1046 97 %     Weight --      Height --      Head Circumference --      Peak Flow --      Pain Score 01/12/23 1045 3     Pain Loc --      Pain Education --      Exclude from Growth Chart --    No data found.  Updated Vital Signs BP (!) 148/85 (BP Location: Left Arm)   Pulse 80   Temp 97.7 F (36.5 C) (Oral)   Resp 18   SpO2 97%   Visual Acuity Right Eye Distance:   Left Eye Distance:   Bilateral Distance:    Right Eye Near:   Left Eye Near:    Bilateral Near:     Physical Exam Vitals and nursing note reviewed.  Constitutional:      General: He is not in acute distress.    Appearance: He is  well-developed.  HENT:     Head: Normocephalic and atraumatic.     Left Ear: There is impacted cerumen.  Eyes:     Conjunctiva/sclera: Conjunctivae normal.  Cardiovascular:     Rate and Rhythm: Normal rate and regular rhythm.  Heart sounds: No murmur heard. Pulmonary:     Effort: Pulmonary effort is normal. No respiratory distress.     Breath sounds: Normal breath sounds.  Abdominal:     Palpations: Abdomen is soft.     Tenderness: There is no abdominal tenderness.  Musculoskeletal:        General: No swelling.     Cervical back: Neck supple.  Skin:    General: Skin is warm and dry.     Capillary Refill: Capillary refill takes less than 2 seconds.  Neurological:     Mental Status: He is alert.  Psychiatric:        Mood and Affect: Mood normal.      UC Treatments / Results  Labs (all labs ordered are listed, but only abnormal results are displayed) Labs Reviewed - No data to display  EKG   Radiology No results found.  Procedures Procedures (including critical care time)  Medications Ordered in UC Medications - No data to display  Initial Impression / Assessment and Plan / UC Course  I have reviewed the triage vital signs and the nursing notes.  Pertinent labs & imaging results that were available during my care of the patient were reviewed by me and considered in my medical decision making (see chart for details).     Cerumen impaction was a special ear lavage in clinic today.  TM normal.  Patient reports it feels much better.  Supportive care discussed.  Advised not to use Q-tips.  Return precautions discussed. Final Clinical Impressions(s) / UC Diagnoses   Final diagnoses:  Impacted cerumen of left ear     Discharge Instructions      Can you Debrox in the future if needed, this can be purchasd over the counter. Avoid qtips in the future       ED Prescriptions   None    PDMP not reviewed this encounter.   Ward, Harlene PEDLAR, PA-C 01/12/23  1144

## 2023-06-07 ENCOUNTER — Ambulatory Visit: Admitting: Podiatry

## 2023-06-24 ENCOUNTER — Ambulatory Visit (INDEPENDENT_AMBULATORY_CARE_PROVIDER_SITE_OTHER): Admitting: Podiatry

## 2023-06-24 ENCOUNTER — Encounter: Payer: Self-pay | Admitting: Podiatry

## 2023-06-24 ENCOUNTER — Ambulatory Visit (INDEPENDENT_AMBULATORY_CARE_PROVIDER_SITE_OTHER)

## 2023-06-24 DIAGNOSIS — I890 Lymphedema, not elsewhere classified: Secondary | ICD-10-CM | POA: Diagnosis not present

## 2023-06-24 DIAGNOSIS — M25572 Pain in left ankle and joints of left foot: Secondary | ICD-10-CM

## 2023-06-24 DIAGNOSIS — M25571 Pain in right ankle and joints of right foot: Secondary | ICD-10-CM | POA: Diagnosis not present

## 2023-06-24 DIAGNOSIS — M7731 Calcaneal spur, right foot: Secondary | ICD-10-CM

## 2023-06-24 DIAGNOSIS — M7732 Calcaneal spur, left foot: Secondary | ICD-10-CM | POA: Diagnosis not present

## 2023-06-24 DIAGNOSIS — I872 Venous insufficiency (chronic) (peripheral): Secondary | ICD-10-CM | POA: Diagnosis not present

## 2023-06-24 MED ORDER — METHYLPREDNISOLONE 4 MG PO TBPK: ORAL_TABLET | ORAL | 0 refills | Status: DC

## 2023-06-24 NOTE — Progress Notes (Signed)
 Patient presents with complaint of pain mostly in the foot on the lateral aspect of the plantar aspect of the foot right.  Little bit on the left also.  Has been having a little more swelling in the right leg than the left.  Type II diabetic.  He has been taking relatives diuretic unprescribed.   Physical exam:  General appearance: Pleasant, and in no acute distress. AOx3.  Vascular: Pedal pulses: DP 2/4 bilaterally, PT 2/4 bilaterally.  Moderate pitting edema lower legs bilaterally. Capillary fill time immediate.  Neurological: Light touch intact feet bilaterally.  Normal Achilles reflex bilaterally.  No clonus or spasticity noted.  Negative Tinel's sign tarsal tunnel and porta pedis bilaterally  Dermatologic:   Skin normal temperature bilaterally.  Skin lower extremity has some hyperpigmentation.  Areas of thinning of skin.  Musculoskeletal: Tenderness at the plantar arches of the feet bilaterally at the heel bilaterally.  Tenderness at the fourth fifth met cuboid joint right.  No tenderness with pressure at the ankle.  Or no pain or tenderness with range of motion ankle joint or subtalar joint.  Radiographs: Radiographs feet bilaterally 3 views: Osteophytic changes plantar and posterior aspect calcaneus no evidence of any fractures.  Normal joint space at fourth fifth met cuboid joint.  No evidence of dislocations.  Normal bone density.  Diagnosis: 1.  Arthralgia fourth fifth met cuboid joint bilaterally right greater than left 2.  Lymphedema bilaterally secondary to venous insufficiency.  Plan: -New patient office visit level 3 for evaluation and management. -Discussed on that and not suspicious of DVT at this point however if he gets increased pain increased swelling he should call immediately. - Discussed the pain and swelling in the feet bilaterally.  Recommended compression hose 20 to 30 mmHg BK bilaterally.  Discussed elevation and exercises he can do.  Talked him about  restricting salt intake.  Caution very strongly about not taking the diuretics without being monitored for them.  Told him he should never take someone else's medicine without a prescription and/or without doctor advice. Rx Medrol  Dosepak take as directed.   Return 2 weeks follow-up pain feet bilaterally consider injection right

## 2023-07-10 ENCOUNTER — Other Ambulatory Visit: Payer: Self-pay | Admitting: Nurse Practitioner

## 2023-07-24 ENCOUNTER — Encounter (HOSPITAL_COMMUNITY): Payer: Self-pay | Admitting: *Deleted

## 2023-07-24 ENCOUNTER — Ambulatory Visit (INDEPENDENT_AMBULATORY_CARE_PROVIDER_SITE_OTHER)

## 2023-07-24 ENCOUNTER — Ambulatory Visit (HOSPITAL_COMMUNITY)
Admission: EM | Admit: 2023-07-24 | Discharge: 2023-07-24 | Disposition: A | Discharge status: Home / Self Care | Attending: Physician Assistant | Admitting: Physician Assistant

## 2023-07-24 DIAGNOSIS — M150 Primary generalized (osteo)arthritis: Secondary | ICD-10-CM

## 2023-07-24 DIAGNOSIS — M545 Low back pain, unspecified: Secondary | ICD-10-CM | POA: Diagnosis not present

## 2023-07-24 LAB — POCT URINALYSIS DIP (MANUAL ENTRY)
Bilirubin, UA: NEGATIVE
Blood, UA: NEGATIVE
Glucose, UA: NEGATIVE mg/dL
Ketones, POC UA: NEGATIVE mg/dL
Leukocytes, UA: NEGATIVE
Nitrite, UA: NEGATIVE
Protein Ur, POC: NEGATIVE mg/dL
Spec Grav, UA: 1.02 (ref 1.010–1.025)
Urobilinogen, UA: 0.2 U/dL
pH, UA: 5.5 (ref 5.0–8.0)

## 2023-07-24 MED ORDER — BACLOFEN 10 MG PO TABS: 10.0000 mg | ORAL_TABLET | Freq: Two times a day (BID) | ORAL | 0 refills | Status: AC | PRN

## 2023-07-24 MED ORDER — PREDNISONE 20 MG PO TABS: 40.0000 mg | ORAL_TABLET | Freq: Every day | ORAL | 0 refills | Status: AC

## 2023-07-24 NOTE — ED Provider Notes (Signed)
 [facility]    CSN: [id number] Arrival date & time: 07/24/23  1746      History   Chief Complaint Chief Complaint  Patient presents with   Back Pain    HPI [name] Pope is a 63 y.o. male.   Patient presents today with a 5-day history of recurrent lower back pain.  He has a history of widespread osteoarthritis including degenerative disc disease with intermittent lower back pain.  He is followed by pain management and prescribed oxycodone .  He reports that generally this manages his pain but approximately 5 days ago he developed severe back pain that is rated 8 on a 0-10 pain scale, described as sharp, worse with certain movements, no alleviating factors identified.  He denies any bowel/bladder incontinence, lower extremity weakness, saddle anesthesia.  Denies any fever, nausea, vomiting.  He denies any recent fall or known trauma.  He reports symptoms began after he mowed the lawn and believes that he might have injured something with this activity.  He has never had any spinal surgery.  He did have an x-ray in April 2024 that showed degenerative changes as well as an MRI in 2015 that showed age-appropriate degenerative changes but has not had any more recent imaging.  He denies any history of malignancy.    Past Medical History:  Diagnosis Date   Allergy    Arthritis    Asthma    BPH with urinary obstruction    COVID-19 virus infection 11/2019   History of appendicitis    Lumbar radiculopathy    Obesity    Osteoporosis     Patient Active Problem List   Diagnosis Date Noted   Obesity    COVID-19 virus infection 11/2019   HLD (hyperlipidemia) 04/17/2018   Type 2 diabetes mellitus (HCC) 04/17/2018   Asthma, chronic 04/17/2018   Alcohol intoxication, uncomplicated (HCC) 12/16/2017   Benign prostatic hyperplasia with urinary obstruction 05/12/2015   DJD (degenerative joint disease) 05/12/2013   Hip pain, chronic 11/19/2011    Past Surgical History:   Procedure Laterality Date   APPENDECTOMY     TOTAL HIP ARTHROPLASTY Right 10/22/2012   Procedure: RIGHT TOTAL HIP ARTHROPLASTY ANTERIOR APPROACH;  Surgeon: Evalene JONETTA Chancy, MD;  Location: [facility] OR;  Service: Orthopedics;  Laterality: Right;   TOTAL HIP ARTHROPLASTY Left 05/12/2013   Procedure: LEFT TOTAL HIP ARTHROPLASTY ANTERIOR APPROACH;  Surgeon: Evalene JONETTA Chancy, MD;  Location: [facility] OR;  Service: Orthopedics;  Laterality: Left;       Home Medications    Prior to Admission medications   Medication Sig Start Date End Date Taking? Authorizing Provider  atorvastatin  (LIPITOR) 40 MG tablet Take 1 tablet (40 mg total) by mouth daily. 12/16/17  Yes Arloa Suzen RAMAN, NP  baclofen  (LIORESAL ) 10 MG tablet Take 1 tablet (10 mg total) by mouth 2 (two) times daily as needed for muscle spasms. 07/24/23  Yes Mitchelle Sultan K, PA-C  cetirizine  (ZYRTEC  ALLERGY) 10 MG tablet Take 1 tablet (10 mg total) by mouth daily. 06/28/20  Yes Christopher Savannah, PA-C  cholecalciferol (VITAMIN D3) 25 MCG (1000 UNIT) tablet Take 1,000 Units by mouth daily.   Yes [provider]  fluticasone  (FLONASE ) 50 MCG/ACT nasal spray Place 2 sprays into both nostrils daily. 06/28/20  Yes Christopher Savannah, PA-C  gabapentin  (NEURONTIN ) 300 MG capsule Take 300 mg by mouth at bedtime as needed (leg pain).  04/09/19  Yes [provider]  ibuprofen  (ADVIL ) 800 MG tablet Take 1 tablet (800 mg  total) by mouth 3 (three) times daily. 01/19/21  Yes White, Adrienne R, NP  lidocaine  (XYLOCAINE ) 2 % solution Use as directed 15 mLs in the mouth or throat as needed for mouth pain. 01/19/21  Yes White, Adrienne R, NP  metFORMIN  (GLUCOPHAGE ) 500 MG tablet Take 1 tablet (500 mg total) by mouth 2 (two) times daily. 12/16/17  Yes Arloa Suzen RAMAN, NP  montelukast  (SINGULAIR ) 10 MG tablet Take 1 tablet (10 mg total) by mouth at bedtime. 03/26/20  Yes Covington, Sarah M, PA-C  oxyCODONE -acetaminophen  (PERCOCET) 10-325 MG tablet Take 1 tablet by mouth 4  (four) times daily as needed. 12/28/22  Yes [provider]  predniSONE  (DELTASONE ) 20 MG tablet Take 2 tablets (40 mg total) by mouth daily for 3 days. 07/24/23 07/27/23 Yes Gladies Sofranko K, PA-C  valACYclovir  (VALTREX ) 500 MG tablet Take 500 mg by mouth daily. 12/28/22  Yes [provider]  blood glucose meter kit and supplies Dispense based on patient and insurance preference. Use up to four times daily as directed. (FOR ICD-10 E10.9, E11.9). 01/13/18   Arloa Suzen RAMAN, NP  mupirocin ointment (BACTROBAN) 2 % Apply 1 Application topically 2 (two) times daily. 06/05/22   [provider]    Family History Family History  Problem Relation Age of Onset   Diabetes Mother    Diabetes Father    Hypertension Father     Social History Social History   Tobacco Use   Smoking status: Never   Smokeless tobacco: Never  Vaping Use   Vaping status: Never Used  Substance Use Topics   Alcohol use: Not Currently   Drug use: Never     Allergies   Penicillins and Shrimp (diagnostic)   Review of Systems Review of Systems  Constitutional:  Positive for activity change. Negative for appetite change, fatigue and fever.  Gastrointestinal:  Negative for abdominal pain, nausea and vomiting.  Genitourinary:  Negative for difficulty urinating, dysuria, frequency and urgency.  Musculoskeletal:  Positive for back pain. Negative for arthralgias and myalgias.  Neurological:  Negative for weakness and numbness.     Physical Exam Triage Vital Signs ED Triage Vitals  Encounter Vitals Group     BP 07/24/23 1842 135/88     Girls Systolic BP Percentile --      Girls Diastolic BP Percentile --      Boys Systolic BP Percentile --      Boys Diastolic BP Percentile --      Pulse Rate 07/24/23 1842 86     Resp 07/24/23 1842 16     Temp 07/24/23 1842 98.3 F (36.8 C)     Temp src --      SpO2 07/24/23 1842 98 %     Weight --      Height --      Head Circumference --      Peak  Flow --      Pain Score 07/24/23 1844 8     Pain Loc --      Pain Education --      Exclude from Growth Chart --    No data found.  Updated Vital Signs BP 135/88   Pulse 86   Temp 98.3 F (36.8 C)   Resp 16   SpO2 98%   Visual Acuity Right Eye Distance:   Left Eye Distance:   Bilateral Distance:    Right Eye Near:   Left Eye Near:    Bilateral Near:     Physical Exam  Vitals reviewed.  Constitutional:      General: He is awake.     Appearance: Normal appearance. He is well-developed. He is not ill-appearing.     Comments: Very pleasant male appears stated age no acute distress sitting comfortably in exam room  HENT:     Head: Normocephalic and atraumatic.     Mouth/Throat:     Pharynx: No oropharyngeal exudate, posterior oropharyngeal erythema or uvula swelling.  Cardiovascular:     Rate and Rhythm: Normal rate and regular rhythm.     Heart sounds: Normal heart sounds, S1 normal and S2 normal. No murmur heard. Pulmonary:     Effort: Pulmonary effort is normal.     Breath sounds: Normal breath sounds. No stridor. No wheezing, rhonchi or rales.     Comments: Clear to auscultation bilaterally Abdominal:     General: Bowel sounds are normal.     Palpations: Abdomen is soft.     Tenderness: There is no abdominal tenderness. There is no right CVA tenderness or left CVA tenderness.     Comments: Benign abdominal exam  Musculoskeletal:     Cervical back: No tenderness or bony tenderness.     Thoracic back: No tenderness or bony tenderness.     Lumbar back: Tenderness and bony tenderness present. Negative right straight leg raise test and negative left straight leg raise test.     Comments: Back: Pain percussion of lumbar vertebrae from approximately L2-L4 without deformity or step-off noted.  Tender to palpation of bilateral lumbar paraspinal muscles.  Strength 4/5 bilateral lower extremities.  Negative straight leg raise bilaterally.  Neurological:     Mental Status: He  is alert.  Psychiatric:        Behavior: Behavior is cooperative.      UC Treatments / Results  Labs (all labs ordered are listed, but only abnormal results are displayed) Labs Reviewed  POCT URINALYSIS DIP (MANUAL ENTRY) - Normal    EKG   Radiology DG Lumbar Spine Complete Result Date: 07/24/2023 CLINICAL DATA:  Low back pain EXAM: LUMBAR SPINE - COMPLETE 4+ VIEW COMPARISON:  04/18/2022 FINDINGS: Five lumbar type vertebral bodies are well visualized. Vertebral body height is well maintained. No pars defects are noted. Mild facet hypertrophic changes are noted at L5-S1. Mild anterolisthesis of L4 on L5 is noted of a degenerative nature. Bilateral hip replacements with are seen. IMPRESSION: Degenerative change without acute abnormality. Electronically Signed   By: Oneil Devonshire M.D.   On: 07/24/2023 20:32    Procedures Procedures (including critical care time)  Medications Ordered in UC Medications - No data to display  Initial Impression / Assessment and Plan / UC Course  I have reviewed the triage vital signs and the nursing notes.  Pertinent labs & imaging results that were available during my care of the patient were reviewed by me and considered in my medical decision making (see chart for details).     Patient is well-appearing, afebrile, nontoxic, nontachycardic.  Patient was concerned that his lower back pain could be related to a urinary tract infection though he denied any additional symptoms.  UA was obtained that showed no abnormalities.  X-ray was obtained given bony tenderness that showed degenerative changes without acute osseous abnormality.  We discussed that degenerative disc disease is likely the cause of his symptoms and so he should follow-up with his pain specialist to determine additional treatment modalities.  In the meantime he is to continue his previously prescribed oxycodone  as well as topical  lidocaine .  He was given baclofen  for additional pain relief  and we discussed that this medication is sedating and he should not combine it with his gabapentin /opioid pain medication as it can increase his risk of overdose.  He is not to drive or drink alcohol with taking this medication.  He did report that he generally responds well to prednisone .  He has recently completed a course of prednisone  and does have a history of diabetes so we discussed that we like to limit use of this medication as can cause hyperglycemia.  He reports that his blood sugars are adequately controlled and so he is interested in a short course if possible of this medication.  I did give him 40 mg for 3 days and we discussed that he is not to take NSAIDs with this medication and risk of GI bleeding.  Recommend close follow-up with his pain management provider.  We discussed that if anything worsens he is to return for reevaluation.  Strict return precautions given.  Final Clinical Impressions(s) / UC Diagnoses   Final diagnoses:  Acute midline low back pain without sciatica     Discharge Instructions      Your urine was normal with no evidence of an infection.  Your x-ray showed arthritis.  Follow-up with sports medicine.  Take prednisone  40 mg for 3 days.  As we discussed, we should limit use of this medication is much as possible because it can cause a lot of side effects.  Do not take NSAIDs with this medication including aspirin , ibuprofen /Advil , naproxen/Aleve.  You can use Tylenol  as well as your prescription pain medicine for pain relief.  Take baclofen  up to twice a day.  This will make you sleepy so do not drive or drink alcohol with taking it.  If you have any fever, worsening pain, difficulty moving your legs, numbness or tingling in your legs, going to the bathroom yourself without noticing it you need to go to the ER.     ED Prescriptions     Medication Sig Dispense Auth. Provider   baclofen  (LIORESAL ) 10 MG tablet Take 1 tablet (10 mg total) by mouth 2 (two) times  daily as needed for muscle spasms. 14 each Itzy Adler K, PA-C   predniSONE  (DELTASONE ) 20 MG tablet Take 2 tablets (40 mg total) by mouth daily for 3 days. 6 tablet Linetta Regner K, PA-C      I have reviewed the PDMP during this encounter.   Sherrell Rocky POUR, PA-C 07/24/23 2047

## 2023-07-24 NOTE — ED Triage Notes (Signed)
 C/O chronic low back pain flare-up onset 5 days ago. Has been using lido cream, Icy Heat without relief. States has appt with pain clinic next week.

## 2023-07-24 NOTE — Discharge Instructions (Signed)
 Your urine was normal with no evidence of an infection.  Your x-ray showed arthritis.  Follow-up with sports medicine.  Take prednisone  40 mg for 3 days.  As we discussed, we should limit use of this medication is much as possible because it can cause a lot of side effects.  Do not take NSAIDs with this medication including aspirin , ibuprofen /Advil , naproxen/Aleve.  You can use Tylenol  as well as your prescription pain medicine for pain relief.  Take baclofen  up to twice a day.  This will make you sleepy so do not drive or drink alcohol with taking it.  If you have any fever, worsening pain, difficulty moving your legs, numbness or tingling in your legs, going to the bathroom yourself without noticing it you need to go to the ER.
